# Patient Record
Sex: Female | Born: 1955 | Race: Black or African American | Hispanic: No | Smoking: Never smoker
Health system: Southern US, Community
[De-identification: ages and names within clinical notes are randomized; demographics above are authoritative.]

## PROBLEM LIST (undated history)

## (undated) DIAGNOSIS — T7840XA Allergy, unspecified, initial encounter: Secondary | ICD-10-CM

## (undated) DIAGNOSIS — I10 Essential (primary) hypertension: Secondary | ICD-10-CM

## (undated) DIAGNOSIS — E0500 Thyrotoxicosis with diffuse goiter without thyrotoxic crisis or storm: Secondary | ICD-10-CM

## (undated) DIAGNOSIS — M1990 Unspecified osteoarthritis, unspecified site: Secondary | ICD-10-CM

## (undated) HISTORY — PX: TUBAL LIGATION: SHX77

## (undated) HISTORY — DX: Thyrotoxicosis with diffuse goiter without thyrotoxic crisis or storm: E05.00

## (undated) HISTORY — PX: CHOLECYSTECTOMY: SHX55

## (undated) HISTORY — DX: Allergy, unspecified, initial encounter: T78.40XA

## (undated) HISTORY — DX: Unspecified osteoarthritis, unspecified site: M19.90

---

## 2000-08-12 ENCOUNTER — Other Ambulatory Visit: Admission: RE | Admit: 2000-08-12 | Discharge: 2000-08-12 | Payer: Self-pay | Admitting: Obstetrics and Gynecology

## 2000-08-21 ENCOUNTER — Ambulatory Visit (HOSPITAL_COMMUNITY): Admission: RE | Admit: 2000-08-21 | Discharge: 2000-08-21 | Payer: Self-pay | Admitting: Obstetrics and Gynecology

## 2002-07-09 ENCOUNTER — Ambulatory Visit (HOSPITAL_COMMUNITY): Admission: RE | Admit: 2002-07-09 | Discharge: 2002-07-09 | Payer: Self-pay | Admitting: Obstetrics and Gynecology

## 2002-07-09 ENCOUNTER — Encounter: Payer: Self-pay | Admitting: Obstetrics and Gynecology

## 2002-07-14 ENCOUNTER — Encounter: Payer: Self-pay | Admitting: Obstetrics and Gynecology

## 2002-07-14 ENCOUNTER — Ambulatory Visit (HOSPITAL_COMMUNITY): Admission: RE | Admit: 2002-07-14 | Discharge: 2002-07-14 | Payer: Self-pay | Admitting: Obstetrics and Gynecology

## 2002-09-27 ENCOUNTER — Ambulatory Visit (HOSPITAL_COMMUNITY): Admission: RE | Admit: 2002-09-27 | Discharge: 2002-09-27 | Payer: Self-pay | Admitting: Internal Medicine

## 2002-09-27 HISTORY — PX: COLONOSCOPY: SHX174

## 2003-02-02 ENCOUNTER — Ambulatory Visit (HOSPITAL_COMMUNITY): Admission: RE | Admit: 2003-02-02 | Discharge: 2003-02-02 | Payer: Self-pay | Admitting: Obstetrics and Gynecology

## 2003-08-09 ENCOUNTER — Ambulatory Visit (HOSPITAL_COMMUNITY): Admission: RE | Admit: 2003-08-09 | Discharge: 2003-08-09 | Payer: Self-pay | Admitting: Obstetrics and Gynecology

## 2006-01-10 ENCOUNTER — Ambulatory Visit (HOSPITAL_COMMUNITY): Admission: RE | Admit: 2006-01-10 | Discharge: 2006-01-10 | Payer: Self-pay | Admitting: Obstetrics and Gynecology

## 2007-01-12 ENCOUNTER — Ambulatory Visit (HOSPITAL_COMMUNITY): Admission: RE | Admit: 2007-01-12 | Discharge: 2007-01-12 | Payer: Self-pay | Admitting: Obstetrics and Gynecology

## 2007-07-10 ENCOUNTER — Other Ambulatory Visit: Admission: RE | Admit: 2007-07-10 | Discharge: 2007-07-10 | Payer: Self-pay | Admitting: Obstetrics and Gynecology

## 2007-09-17 ENCOUNTER — Ambulatory Visit: Payer: Self-pay | Admitting: Gastroenterology

## 2007-09-22 ENCOUNTER — Ambulatory Visit (HOSPITAL_COMMUNITY): Admission: RE | Admit: 2007-09-22 | Discharge: 2007-09-22 | Payer: Self-pay | Admitting: Gastroenterology

## 2007-09-22 ENCOUNTER — Ambulatory Visit: Payer: Self-pay | Admitting: Gastroenterology

## 2007-09-22 ENCOUNTER — Encounter: Payer: Self-pay | Admitting: Gastroenterology

## 2007-09-22 HISTORY — PX: COLONOSCOPY: SHX174

## 2010-07-17 NOTE — H&P (Signed)
NAME:  Hannah Knight, Hannah Knight        ACCOUNT NO.:  [id number]   MEDICAL RECORD NO.:  [id number]          PATIENT TYPE:  AMB   LOCATION:  DAY                           FACILITY:  [facility]   PHYSICIAN:  Kassie Mends, M.D.      DATE OF BIRTH:  1956-01-07   DATE OF ADMISSION:  DATE OF DISCHARGE:  LH                              HISTORY & PHYSICAL   CHIEF COMPLAINT:  Need for surveillance colonoscopy.  She has history of  inflammatory polyps and family history of colorectal carcinoma.   PRIMARY CARE PHYSICIAN:  Edward L. Juanetta Gosling, MD   HISTORY OF PRESENT ILLNESS:  Hannah Knight is a 55 year old African  American female.  She has history of inflammatory polyps and family  history of colon cancer where her father died at age 8.  She denies any  abdominal pain.  Denies rectal bleeding or melena.  Denies any diarrhea  or constipation.  Denies any nausea, vomiting, dysphagia, or  odynophagia.  Denies any upper GI symptoms, but rare heartburn.  Overall, she is doing very well.   PAST MEDICAL AND SURGICAL HISTORY:  She had a cholecystectomy 15 years  ago.  She had a tubal ligation.   Colonoscopy by Dr. Karilyn Cota, on September 27, 2002, showed 6 tiny polyps  removed from the sigmoid colon, which were hyperplastic and an  inflammatory polyp was removed from colonoscopy by Dr. Karilyn Cota, on  February 12, 1993.  She had 3 polyps 2 in the descending colon and 1 in  the sigmoid colon, which is inflammatory.   CURRENT MEDICATIONS:  Multivitamin daily.   ALLERGIES:  No known drug allergies.   FAMILY HISTORY:  Positive for father with colon cancer and mother  deceased at age 53 with MI.  She has lost a brother with lung cancer.   SOCIAL HISTORY:  Hannah Knight is married.  She is a [employer] officer, political party.  Denies any tobacco, alcohol, or drug use.   REVIEW OF SYSTEMS:  See HPI, otherwise negative.   PHYSICAL EXAMINATION:  VITAL SIGNS:  Weight 290 pounds, height 66  inches, temperature 98.6, blood pressure  122/84, and pulse 64.  GENERAL:  Hannah Knight is a well-developed and well-nourished African  American female, in no acute distress.  HEENT:  Sclerae clear, nonicteric.  Conjunctiva pink.  Oropharynx pink  and moist without any lesions.  NECK:  Supple without mass or thyromegaly.  CHEST:  Heart regular rate and rhythm.  Normal S1 and S2 without  murmurs, clicks, rubs, or gallops.  LUNGS:  Clear to auscultation bilaterally.  ABDOMEN:  Positive bowel sounds x4.  No bruits auscultated.  Soft,  nontender, and nondistended without palpable mass or hepatosplenomegaly.  No rebound, tenderness, or guarding.  EXTREMITIES:  Without clubbing or edema.   ASSESSMENT:  Hannah Knight is a 55 year old African American female  with history of inflammatory polyps and family history of colon cancer.  She is due for followup colonoscopy at this time.   PLAN:  Colonoscopy with Dr. Cira Servant in the near future.  Discussed  procedure including risks and benefits, including, but not limited to  bleeding, infection, perforation and drug reaction.  Consent will be  obtained.  She agrees with this plan.      Lorenza Burton, N.P.      Kassie Mends, M.D.  Electronically Signed    KJ/MEDQ  D:  09/18/2007  T:  09/18/2007  Job:  [id number]   cc:   Ramon Dredge L. Juanetta Gosling, M.D.  Fax: [phone]

## 2010-07-17 NOTE — Op Note (Signed)
NAME:  Hannah Knight, Hannah Knight        ACCOUNT NO.:  [id number]   MEDICAL RECORD NO.:  [id number]          PATIENT TYPE:  AMB   LOCATION:  DAY                           FACILITY:  [facility]   PHYSICIAN:  Kassie Mends, M.D.      DATE OF BIRTH:  02-Mar-1956   DATE OF PROCEDURE:  09/22/2007  DATE OF DISCHARGE:                               OPERATIVE REPORT   REFERRING PHYSICIAN:  Kari Baars, M.D.   PROCEDURE:  Colonoscopy with cold forceps biopsy and polypectomy.   INDICATIONS FOR EXAM:  Hannah Knight is a 55 year old female whose  father had colon cancer at age 63.   FINDINGS:  1. Multiple 3-4 mm sessile polyps seen in the splenic flexure in the      descending colon.  The polyps were removed via cold forceps;      otherwise no masses, inflammatory changes, diverticular AVM seen.  2. Moderate internal hemorrhoids, otherwise normal retroflex view of      the rectum.   DIAGNOSIS:  Colon polyps in the patient with the first-degree relative  diagnosed with colon cancer at age less than 7.   RECOMMENDATIONS:  1. Screening colonoscopy in 5 years.  2. Will call Hannah Knight with the results of her biopsies.  3. No aspirin, NSAIDs, or anticoagulation for 5 days.  4. She should follow a high-fiber diet.  She was given a handout on      high-fiber diet and polyps.   MEDICATIONS:  1. Demerol 125 mg IV.  2. Versed 6 mg IV.   PROCEDURE TECHNIQUE:  Physical exam was performed.  Informed consent was  obtained from the patient after explaining the benefits, risks, and  alternatives to the procedure.  The patient was connected to the monitor  and placed in the left lateral position.  Continuous oxygen was provided  via a nasal cannula.  IV medicine was administered through an indwelling  cannula.  After administration of sedation and rectal exam, the  patient's rectum was intubated and the scope was advanced under direct  visualization to the cecum.  The scope was removed slowly  by  carefully examining the color, texture, anatomy, and integrity of the  mucosa on the way out.  The patient was recovered in endoscopy and  discharged home in satisfactory condition.   PATH:  Hyperplastic polyp. TCS 5 yrs. High fiber diet.      Kassie Mends, M.D.  Electronically Signed     SM/MEDQ  D:  09/22/2007  T:  09/23/2007  Job:  [id number]   cc:   Hannah Knight, M.D.  Fax: [phone]

## 2010-07-20 NOTE — Op Note (Signed)
NAME:  Hannah Knight, Hannah Knight                  ACCOUNT NO.:  [id number]   MEDICAL RECORD NO.:  [id number]                   PATIENT TYPE:  AMB   LOCATION:  DAY                                  FACILITY:  [facility]   PHYSICIAN:  Lionel December, M.D.                 DATE OF BIRTH:  May 16, 1955   DATE OF PROCEDURE:  DATE OF DISCHARGE:                                 OPERATIVE REPORT   PROCEDURE:  Total colonoscopy.   ENDOSCOPIST:  Lionel December, M.D.   INDICATIONS:  Dnasia is a 55 year old African-American female who has heme-  positive stools.  She does not have GI symptoms and does not take any  NSAIDs.  Family history is sigmoid colon for colon carcinoma in her father  who died of it at age 56.  Her last colonoscopy was in 1994.  The procedures  were reviewed with the patient and informed consent was obtained.   PREOPERATIVE MEDICATIONS:  Demerol 50 mg IV and Versed 9 mg IV in divided  doses.   FINDINGS:  Procedure performed in endoscopy suite.  The patient's vital  signs and O2 saturation were monitored during the procedure and remained  stable.  The patient was placed in the left lateral recumbent position and  rectal examination was performed.  No abnormality noted on external or  digital exam.   Olympus videoscope was placed in the rectum and advanced under vision into  the sigmoid colon and beyond.  The sigmoid colon was very tortuous.  Some  difficulty was encountered in passing the scope across the splenic flexure.  Once this was done, the scope was easily advanced into the cecum which was  identified by appendiceal orifice and the ileocecal valve.  Pictures were  taken for the record.  As the scope was withdrawn colonic mucosa was, once  again, carefully examined.  There were multiple (6) close tiny polyps of the  sigmoid colon which were ablated by cold biopsy and submitted in 1  container.  Rectal mucosa was normal.   The scope was retroflexed to examine the anorectal  junction and small  hemorrhoids were noted below the dentate line.  The endoscope was  straightened and withdrawn.  The patient tolerated the procedure well.   FINAL DIAGNOSES:  1. Examination performed to the cecum.  Redundant sigmoid colon.  Six tiny     polyps ablated by cold biopsy from sigmoid colon.  2. Small external hemorrhoids.    RECOMMENDATIONS:  1. Contact patient with biopsy results.  2. Will plan to repeat her Hemoccults in 3 months from now.                                               Lionel December, M.D.    NR/MEDQ  D:  09/27/2002  T:  09/27/2002  Job:  [id number]   cc:   Tilda Burrow, M.D.  [address] [place]  [place] [address]  Fax: [phone]   Oneal Deputy. Juanetta Gosling, M.D.  [address]  [place]  [place] [address]  Fax: [phone]

## 2010-07-20 NOTE — H&P (Signed)
NAME:  Hannah, Knight                  ACCOUNT NO.:  [id number]   MEDICAL RECORD NO.:  [id number]                  PATIENT TYPE:   LOCATION:                                       FACILITY:   PHYSICIAN:  Lionel December, M.D.                 DATE OF BIRTH:  [birth year]   DATE OF ADMISSION:  DATE OF DISCHARGE:                                HISTORY & PHYSICAL   PRESENTING COMPLAINT:  Heme-positive stool noted on routine exam.   HISTORY:  Hannah Knight is a 55 year old African American female who referred by  the courtesy of Dr. Emelda Fear for further evaluation of heme-positive stool  which was picked up on routine pelvic and Pap done on July 01, 2002.  The  patient made an appointment on Jul 19, 2002, but this was moved to this  date.  She denies melena, rectal bleeding, change in her bowel habits,  abdominal pain, nausea, vomiting, heartburn, or dysphagia.  She has a very  good appetite.  She does not take aspirin or other NSAIDs.  She actually  does not take any prescription medications.  What she is taking now includes  Centrum vitamin, vitamin B, and vitamin C.   Hannah Knight had a colonoscopy by myself in December 1994, because of rectal  bleeding, some diarrhea.  She had internal hemorrhoids and she had two small  polyps, two in the descending colon and one in the sigmoid which were all  inflammatory.   PAST MEDICAL HISTORY:  She has no medical problems.  She had laparoscopic  cholecystectomy in May 1993, and had tubal ligation either three or four  years ago.   FAMILY HISTORY:  Both parents are deceased.  Mother was diabetic and died of  MI at age 63.  Father died of colon carcinoma at age 69.  One brother died  of lung carcinoma at age 28.  Two brothers and five sisters are in good  health.  Three sisters are diabetic and two of them are hypertensive.   SOCIAL HISTORY:  She is married, but does not have any children.  She has  worked at [employer] for 18 years until  it closed down and now she  works at [employer].  She has never smoked cigarettes and does  not drink alcohol.   PHYSICAL EXAMINATION:  GENERAL:  Mildly obese African American female who is  in no acute distress.  She weighs 217 pounds.  ____________.  VITAL SIGNS:  Blood pressure 130/100, temperature 98.7.  HEENT:  Conjunctivae pink.  Sclerae nonicteric.  Oropharyngeal mucosa is  normal.  NECK:  Without masses or thyromegaly.  CARDIAC:  Rate and rhythm normal, S1, S2, no murmur or gallop noted.  LUNGS:  Clear to auscultation.  ABDOMEN:  Full, but soft and nontender with no organomegaly or masses.  RECTAL:  Deferred.  She does not have peripheral edema or clubbing.   ASSESSMENT:  Hannah Knight is a 55 year old African  American female who was  recently noted to have heme-positive stools.  She has virtually no GI  symptoms and does not take any NSAIDs.  Her last colonoscopy was in December  1994, revealing three inflammatory polyps.  Family history is significant  for colon carcinoma in a father who died at age 75.   Hannah Knight needs to undergo colonoscopy both for diagnostic and screening  purposes.   RECOMMENDATIONS:  Total colonoscopy to be performed at [facility] in the near  future.  I have reviewed the procedure risks with the patient.  She is  agreeable.                                               Lionel December, M.D.    NR/MEDQ  D:  09/16/2002  T:  09/16/2002  Job:  [id number]   cc:   Tilda Burrow, M.D.  [address] [place]  [place] [address]  Fax: [phone]   Dr. Juanetta Gosling

## 2010-10-18 ENCOUNTER — Other Ambulatory Visit: Payer: Self-pay | Admitting: Adult Health

## 2010-10-18 ENCOUNTER — Other Ambulatory Visit (HOSPITAL_COMMUNITY)
Admission: RE | Admit: 2010-10-18 | Discharge: 2010-10-18 | Disposition: A | Discharge status: Home / Self Care | Payer: 59 | Source: Ambulatory Visit | Attending: Obstetrics and Gynecology | Admitting: Obstetrics and Gynecology

## 2010-10-18 DIAGNOSIS — Z01419 Encounter for gynecological examination (general) (routine) without abnormal findings: Secondary | ICD-10-CM | POA: Insufficient documentation

## 2010-12-04 ENCOUNTER — Other Ambulatory Visit (HOSPITAL_COMMUNITY): Payer: Self-pay | Admitting: Pulmonary Disease

## 2010-12-04 DIAGNOSIS — Z139 Encounter for screening, unspecified: Secondary | ICD-10-CM

## 2010-12-11 ENCOUNTER — Ambulatory Visit (HOSPITAL_COMMUNITY)
Admission: RE | Admit: 2010-12-11 | Discharge: 2010-12-11 | Disposition: A | Discharge status: Home / Self Care | Payer: 59 | Source: Ambulatory Visit | Attending: Pulmonary Disease | Admitting: Pulmonary Disease

## 2010-12-11 DIAGNOSIS — Z1231 Encounter for screening mammogram for malignant neoplasm of breast: Secondary | ICD-10-CM | POA: Insufficient documentation

## 2010-12-11 DIAGNOSIS — Z139 Encounter for screening, unspecified: Secondary | ICD-10-CM

## 2010-12-11 LAB — HM MAMMOGRAPHY

## 2011-01-31 ENCOUNTER — Other Ambulatory Visit (HOSPITAL_COMMUNITY): Payer: Self-pay | Admitting: "Endocrinology

## 2011-01-31 DIAGNOSIS — E0590 Thyrotoxicosis, unspecified without thyrotoxic crisis or storm: Secondary | ICD-10-CM

## 2011-02-04 ENCOUNTER — Encounter (HOSPITAL_COMMUNITY)
Admission: RE | Admit: 2011-02-04 | Discharge: 2011-02-04 | Disposition: A | Discharge status: Home / Self Care | Payer: 59 | Source: Ambulatory Visit | Attending: "Endocrinology | Admitting: "Endocrinology

## 2011-02-04 ENCOUNTER — Encounter (HOSPITAL_COMMUNITY): Payer: Self-pay

## 2011-02-04 DIAGNOSIS — E0590 Thyrotoxicosis, unspecified without thyrotoxic crisis or storm: Secondary | ICD-10-CM

## 2011-02-04 HISTORY — DX: Essential (primary) hypertension: I10

## 2011-02-04 MED ORDER — SODIUM IODIDE I 131 CAPSULE
10.0000 | Freq: Once | INTRAVENOUS | Status: AC | PRN
  Administered 2011-02-04: 9 via ORAL

## 2011-02-05 ENCOUNTER — Encounter (HOSPITAL_COMMUNITY)
Admission: RE | Admit: 2011-02-05 | Discharge: 2011-02-05 | Disposition: A | Discharge status: Home / Self Care | Payer: 59 | Source: Ambulatory Visit | Attending: "Endocrinology | Admitting: "Endocrinology

## 2011-02-05 DIAGNOSIS — E0590 Thyrotoxicosis, unspecified without thyrotoxic crisis or storm: Secondary | ICD-10-CM | POA: Insufficient documentation

## 2011-02-05 MED ORDER — SODIUM PERTECHNETATE TC 99M INJECTION
10.0000 | Freq: Once | INTRAVENOUS | Status: AC | PRN
  Administered 2011-02-05: 10 via INTRAVENOUS

## 2011-02-07 ENCOUNTER — Other Ambulatory Visit (HOSPITAL_COMMUNITY): Payer: Self-pay | Admitting: "Endocrinology

## 2011-02-07 DIAGNOSIS — E0500 Thyrotoxicosis with diffuse goiter without thyrotoxic crisis or storm: Secondary | ICD-10-CM

## 2011-02-13 ENCOUNTER — Encounter (HOSPITAL_COMMUNITY)
Admission: RE | Admit: 2011-02-13 | Discharge: 2011-02-13 | Disposition: A | Discharge status: Home / Self Care | Payer: 59 | Source: Ambulatory Visit | Attending: "Endocrinology | Admitting: "Endocrinology

## 2011-02-13 DIAGNOSIS — E0500 Thyrotoxicosis with diffuse goiter without thyrotoxic crisis or storm: Secondary | ICD-10-CM

## 2011-02-13 DIAGNOSIS — E0590 Thyrotoxicosis, unspecified without thyrotoxic crisis or storm: Secondary | ICD-10-CM | POA: Insufficient documentation

## 2011-02-13 MED ORDER — SODIUM IODIDE I 131 CAPSULE
12.0000 | Freq: Once | INTRAVENOUS | Status: AC | PRN
  Administered 2011-02-13: 12 via ORAL

## 2013-05-19 ENCOUNTER — Telehealth: Payer: Self-pay | Admitting: Gastroenterology

## 2013-05-19 NOTE — Telephone Encounter (Signed)
Pt called today to set up her tcs. She is having no problems, but is due for one. SF did her last one. [phone]

## 2013-05-19 NOTE — Telephone Encounter (Signed)
I called pt in reference to scheduling colonoscopy. She has had blood in her stool a couple of times recently. Ov scheduled with Laban Emperor, NP on 06/17/2013 at 8:30 AM.

## 2013-05-20 NOTE — Telephone Encounter (Signed)
Routing to Anna Sams, NP. 

## 2013-05-24 NOTE — Telephone Encounter (Signed)
Noted  

## 2013-06-17 ENCOUNTER — Encounter (HOSPITAL_COMMUNITY): Payer: Self-pay | Admitting: Pharmacy Technician

## 2013-06-17 ENCOUNTER — Encounter: Payer: Self-pay | Admitting: Gastroenterology

## 2013-06-17 ENCOUNTER — Ambulatory Visit (INDEPENDENT_AMBULATORY_CARE_PROVIDER_SITE_OTHER): Payer: 59 | Admitting: Gastroenterology

## 2013-06-17 ENCOUNTER — Encounter (INDEPENDENT_AMBULATORY_CARE_PROVIDER_SITE_OTHER): Payer: Self-pay

## 2013-06-17 VITALS — BP 124/80 | HR 70 | Temp 97.6°F | Ht 67.0 in | Wt 214.6 lb

## 2013-06-17 DIAGNOSIS — Z800 Family history of malignant neoplasm of digestive organs: Secondary | ICD-10-CM

## 2013-06-17 DIAGNOSIS — K625 Hemorrhage of anus and rectum: Secondary | ICD-10-CM

## 2013-06-17 MED ORDER — HYDROCORTISONE ACETATE 25 MG RE SUPP: 25.0000 mg | Freq: Two times a day (BID) | RECTAL | Status: DC

## 2013-06-17 MED ORDER — PEG-KCL-NACL-NASULF-NA ASC-C 100 G PO SOLR: 1.0000 | ORAL | Status: DC

## 2013-06-17 NOTE — Patient Instructions (Signed)
I have sent suppositories to the pharmacy to use twice a day for 6 days. This is to help calm down any inflammation that may be inside your rectum from hemorrhoids.  You have been scheduled for a colonoscopy with Dr. Oneida Alar in the near future.   Do not take Metformin the morning of the colonoscopy.

## 2013-06-17 NOTE — Progress Notes (Signed)
cc'd to pcp 

## 2013-06-17 NOTE — Assessment & Plan Note (Signed)
Father, age 56, deceased. TCS as planned.

## 2013-06-17 NOTE — Assessment & Plan Note (Signed)
58 year old female with moderate volume hematochezia several weeks ago, now resolved. No other associated factors. History of internal hemorrhoids; doubt significant GI issue but needs high risk screening colonoscopy due to [place] of colon cancer in father at age 75. Last colonoscopy in 2009, just slightly overdue for screening.   Proceed with colonoscopy with Dr. Oneida Alar in the near future. The risks, benefits, and alternatives have been discussed in detail with the patient. They state understanding and desire to proceed.  Anusol supp BID

## 2013-06-17 NOTE — Progress Notes (Signed)
Primary Care Physician:  Alonza Bogus, MD Primary Gastroenterologist:  Dr. Oneida Alar   Chief Complaint  Patient presents with  . Rectal Bleeding  . Colonoscopy    HPI:   Hannah Knight presents today with a family history of colon cancer in her father at age 56; she has noticed intermittent rectal bleeding. A little overdue for high risk screening colonoscopy. Last procedure in 2009 with internal hemorrhoids and hyperplastic polyps. States she would see it for a week or two then stop. No further rectal bleeding for several weeks. No change in bowel habits. No abdominal pain. No unexplained weight loss or lack of appetite. No reflux, dysphagia, upper GI symptoms.  Past Medical History  Diagnosis Date  . Diabetes mellitus   . Hypertension   . Graves disease     with radiation, now on Synthroid    Past Surgical History  Procedure Laterality Date  . Colonoscopy    09/22/2007    SLF: moderate internal hemorrhoids, multiple 3-4 mm sessile polyps in splenic flexure, hyperplastic.   Marland Kitchen Colonoscopy  09/27/2002      Rehman:Small external hemorrhoids/Six tiny   polyps ablated by cold biopsy from sigmoid colon  . Cholecystectomy    . Tubal ligation      Current Outpatient Prescriptions  Medication Sig Dispense Refill  . amLODipine (NORVASC) 5 MG tablet Take 5 mg by mouth daily.      Marland Kitchen levothyroxine (SYNTHROID, LEVOTHROID) 75 MCG tablet Take 75 mcg by mouth daily before breakfast.      . losartan (COZAAR) 100 MG tablet Take 100 mg by mouth daily.      . metFORMIN (GLUCOPHAGE) 500 MG tablet Take 500 mg by mouth daily with breakfast.       No current facility-administered medications for this visit.    Allergies as of 06/17/2013 - Review Complete 06/17/2013  Allergen Reaction Noted  . Ace inhibitors  02/04/2011    Family History  Problem Relation Age of Onset  . Colon cancer Father 59    deceased    History   Social History  . Marital Status: Married   Spouse Name: N/A    Number of Children: N/A  . Years of Education: N/A   Occupational History  . Self-employed     sitter   Social History Main Topics  . Smoking status: Never Smoker   . Smokeless tobacco: Not on file  . Alcohol Use: No  . Drug Use: No  . Sexual Activity: Not on file   Other Topics Concern  . Not on file   Social History Narrative  . No narrative on file    Review of Systems: Negative unless mentioned in HPI.   Physical Exam: BP 124/80  Pulse 70  Temp(Src) 97.6 F (36.4 C) (Oral)  Ht 5\' 7"  (1.702 m)  Wt 214 lb 9.6 oz (97.342 kg)  BMI 33.60 kg/m2 General:   Alert and oriented. Pleasant and cooperative. Well-nourished and well-developed.  Head:  Normocephalic and atraumatic. Eyes:  Without icterus, sclera clear and conjunctiva pink.  Ears:  Normal auditory acuity. Nose:  No deformity, discharge,  or lesions. Mouth:  No deformity or lesions, oral mucosa pink.  Neck:  Supple, without mass or thyromegaly. Lungs:  Clear to auscultation bilaterally. No wheezes, rales, or rhonchi. No distress.  Heart:  S1, S2 present without murmurs appreciated.  Abdomen:  +BS, soft, non-tender and non-distended. No HSM noted. No guarding or rebound. No masses appreciated.  Rectal:  Deferred  Msk:  Symmetrical without gross deformities. Normal posture. Extremities:  Without clubbing or edema. Neurologic:  Alert and  oriented x4;  grossly normal neurologically. Skin:  Intact without significant lesions or rashes. Cervical Nodes:  No significant cervical adenopathy. Psych:  Alert and cooperative. Normal mood and affect.

## 2013-06-29 ENCOUNTER — Ambulatory Visit (HOSPITAL_COMMUNITY)
Admission: RE | Admit: 2013-06-29 | Discharge: 2013-06-29 | Disposition: A | Discharge status: Home / Self Care | Payer: 59 | Source: Ambulatory Visit | Attending: Gastroenterology | Admitting: Gastroenterology

## 2013-06-29 ENCOUNTER — Encounter (HOSPITAL_COMMUNITY)
Admission: RE | Disposition: A | Discharge status: Home / Self Care | Payer: Self-pay | Source: Ambulatory Visit | Attending: Gastroenterology

## 2013-06-29 ENCOUNTER — Encounter (HOSPITAL_COMMUNITY): Payer: Self-pay | Admitting: *Deleted

## 2013-06-29 DIAGNOSIS — E119 Type 2 diabetes mellitus without complications: Secondary | ICD-10-CM | POA: Insufficient documentation

## 2013-06-29 DIAGNOSIS — K625 Hemorrhage of anus and rectum: Secondary | ICD-10-CM | POA: Insufficient documentation

## 2013-06-29 DIAGNOSIS — Z800 Family history of malignant neoplasm of digestive organs: Secondary | ICD-10-CM | POA: Insufficient documentation

## 2013-06-29 DIAGNOSIS — Z79899 Other long term (current) drug therapy: Secondary | ICD-10-CM | POA: Insufficient documentation

## 2013-06-29 DIAGNOSIS — K5730 Diverticulosis of large intestine without perforation or abscess without bleeding: Secondary | ICD-10-CM | POA: Insufficient documentation

## 2013-06-29 DIAGNOSIS — I10 Essential (primary) hypertension: Secondary | ICD-10-CM | POA: Insufficient documentation

## 2013-06-29 DIAGNOSIS — K648 Other hemorrhoids: Secondary | ICD-10-CM | POA: Insufficient documentation

## 2013-06-29 HISTORY — PX: HEMORRHOID BANDING: SHX5850

## 2013-06-29 HISTORY — PX: COLONOSCOPY: SHX5424

## 2013-06-29 LAB — HM COLONOSCOPY

## 2013-06-29 SURGERY — COLONOSCOPY
Anesthesia: Moderate Sedation

## 2013-06-29 MED ORDER — SODIUM CHLORIDE 0.9 % IV SOLN
INTRAVENOUS | Status: DC
  Administered 2013-06-29: 1000 mL via INTRAVENOUS

## 2013-06-29 MED ORDER — MEPERIDINE HCL 100 MG/ML IJ SOLN
INTRAMUSCULAR | Status: AC
  Filled 2013-06-29: qty 2

## 2013-06-29 MED ORDER — PROMETHAZINE HCL 25 MG/ML IJ SOLN
INTRAMUSCULAR | Status: DC | PRN
  Administered 2013-06-29: 12.5 mg via INTRAVENOUS

## 2013-06-29 MED ORDER — MIDAZOLAM HCL 5 MG/5ML IJ SOLN
INTRAMUSCULAR | Status: AC
  Filled 2013-06-29: qty 10

## 2013-06-29 MED ORDER — MIDAZOLAM HCL 5 MG/5ML IJ SOLN
INTRAMUSCULAR | Status: DC | PRN
  Administered 2013-06-29 (×2): 2 mg via INTRAVENOUS

## 2013-06-29 MED ORDER — SODIUM CHLORIDE 0.9 % IJ SOLN
INTRAMUSCULAR | Status: AC
  Filled 2013-06-29: qty 10

## 2013-06-29 MED ORDER — MEPERIDINE HCL 100 MG/ML IJ SOLN
INTRAMUSCULAR | Status: DC | PRN
  Administered 2013-06-29: 50 mg via INTRAVENOUS
  Administered 2013-06-29: 25 mg via INTRAVENOUS

## 2013-06-29 MED ORDER — PROMETHAZINE HCL 25 MG/ML IJ SOLN
INTRAMUSCULAR | Status: AC
  Filled 2013-06-29: qty 1

## 2013-06-29 NOTE — Progress Notes (Signed)
REVIEWED.  

## 2013-06-29 NOTE — Op Note (Signed)
[facility] [address] [place], [address]   COLONOSCOPY PROCEDURE REPORT  PATIENT: Hannah Knight, Hannah Knight  MR#: [id number] BIRTHDATE: Feb 03, 1956 , 13  yrs. old GENDER: Female ENDOSCOPIST: Barney Drain, MD REFERRED [place]:[id number] Luan Pulling, M.D. PROCEDURE DATE:  06/29/2013 PROCEDURE:   Colonoscopy, diagnostic and Hemorrhoidectomy via banding(3) INDICATIONS:Rectal Bleeding and Patient's immediate family history of colon cancer. MEDICATIONS: Promethazine (Phenergan) 12.5mg  IV, Demerol 75 mg IV, and Versed 4 mg IV  DESCRIPTION OF PROCEDURE:    Physical exam was performed.  Informed consent was obtained from the patient after explaining the benefits, risks, and alternatives to procedure.  The patient was connected to monitor and placed in left lateral position. Continuous oxygen was provided by nasal cannula and IV medicine administered through an indwelling cannula.  After administration of sedation and rectal exam, the patients rectum was intubated and the EC-[id number] ([id number]) and [id number] ([id number])  colonoscope was advanced under direct visualization to the ileum.  The scope was removed slowly by carefully examining the color, texture, anatomy, and integrity mucosa on the way out.  The patient was recovered in endoscopy and discharged home in satisfactory condition.    COLON FINDINGS: The mucosa appeared normal in the terminal ileum.  , Mild diverticulosis was noted in the sigmoid colon.  , The colon mucosa was otherwise normal.  , and Moderate sized internal hemorrhoids were found.    3 BANDS APPLIED.  PREP QUALITY: good. CECAL W/D TIME: 14 minutes     COMPLICATIONS: None  ENDOSCOPIC IMPRESSION: 1.   Normal mucosa in the terminal ileum 2.   Mild diverticulosis in the sigmoid colon 3.   RECTAL BLEEDING DUE TO Moderate sized internal hemorrhoids   RECOMMENDATIONS: CALL [phone] IF YOU HAVE A FEVER, A LARGE AMOUNT OF BLEEDING, OR DIFFICULTY URINATING. DRINK  WATER TO KEEP URINE LIGHT YELLOW. MAY USE NAPROXEN TWICE DAILY FOR RECTAL DISCOMFORT.  TAKE WITH FOOD OR MILK TO PREVENT ULCERS.  TYLENOL AS NEED FOR ADDITIONAL PAIN RELIEF. COLACE ONCE OR TWICE DAILY TO KEEP STOOLS SOFT. FOLLOW A LOW RESIDUE DIET FOR THE NEXT 2 WEEKS.  FOLLOW UP MAY 13 AT 1130 Next colonoscopy in 5 years.   _______________________________eSigned:  Barney Drain, MD 06/29/2013 3:29 PM

## 2013-06-29 NOTE — Discharge Instructions (Signed)
You have internal hemorrhoids. YOU DID NOT HAVE ANY POLYPS. I PLACED 3 LIGHT BLUE BANDS TO TREAT YOUR HEMORRHOIDAL BLEEDING. YOU MAY SOME MILD BLEEDING OVER THE NEXT 3 TO 5 DAYS.    CALL [phone] IF YOU HAVE A FEVER, A LARGE AMOUNT OF BLEEDING, OR DIFFICULTY URINATING.  DRINK WATER TO KEEP URINE LIGHT YELLOW.  YOU MAY USE NAPROXEN TWICE DAILY FOR RECTAL DISCOMFORT. TAKE WITH FOOD OR MILK TO PREVENT ULCERS. TYLENOL AS NEED FOR ADDITIONAL PAIN RELIEF.  COLACE ONCE OR TWICE DAILY TO KEEP STOOLS SOFT.  FOLLOW A LOW RESIDUE DIET FOR THE NEXT 2 WEEKS. SEE INFO BELOW.  FOLLOW UP IN 3 WEEKS.  Next colonoscopy in 5 years.    Colonoscopy Care After Read the instructions outlined below and refer to this sheet in the next week. These discharge instructions provide you with general information on caring for yourself after you leave the hospital. While your treatment has been planned according to the most current medical practices available, unavoidable complications occasionally occur. If you have any problems or questions after discharge, call DR. FIELDS, [phone].  ACTIVITY  You may resume your regular activity, but move at a slower pace for the next 24 hours.   Take frequent rest periods for the next 24 hours.   Walking will help get rid of the air and reduce the bloated feeling in your belly (abdomen).   No driving for 24 hours (because of the medicine (anesthesia) used during the test).   You may shower.   Do not sign any important legal documents or operate any machinery for 24 hours (because of the anesthesia used during the test).    NUTRITION  Drink plenty of fluids.   You may resume your normal diet as instructed by your doctor.   Begin with a light meal and progress to your normal diet. Heavy or fried foods are harder to digest and may make you feel sick to your stomach (nauseated).   Avoid alcoholic beverages for 24 hours or as instructed.    MEDICATIONS  You  may resume your normal medications.   WHAT YOU CAN EXPECT TODAY  Some feelings of bloating in the abdomen.   Passage of more gas than usual.   Spotting of blood in your stool or on the toilet paper  .  IF YOU HAD POLYPS REMOVED DURING THE COLONOSCOPY:  Eat a soft diet IF YOU HAVE NAUSEA, BLOATING, ABDOMINAL PAIN, OR VOMITING.    FINDING OUT THE RESULTS OF YOUR TEST Not all test results are available during your visit. DR. Oneida Alar WILL CALL YOU WITHIN 7 DAYS OF YOUR PROCEDUE WITH YOUR RESULTS. Do not assume everything is normal if you have not heard from DR. FIELDS IN ONE WEEK, CALL HER OFFICE AT [phone].  SEEK IMMEDIATE MEDICAL ATTENTION AND CALL THE OFFICE: [phone] IF:  You have more than a spotting of blood in your stool.   Your belly is swollen (abdominal distention).   You are nauseated or vomiting.   You have a temperature over 101F.   You have abdominal pain or discomfort that is severe or gets worse throughout the day.   Hemorrhoids Hemorrhoids are dilated (enlarged) veins around the rectum. Sometimes clots will form in the veins. This makes them swollen and painful. These are called thrombosed hemorrhoids. Causes of hemorrhoids include:  Constipation.   Straining to have a bowel movement.   HEAVY LIFTING   HOME CARE INSTRUCTIONS  Eat a well balanced diet and drink 6 to  8 glasses of water every day to avoid constipation. You may also use a bulk laxative.   Avoid straining to have bowel movements.   Keep anal area dry and clean.   Do not use a donut shaped pillow or sit on the toilet for long periods. This increases blood pooling and pain.   Move your bowels when your body has the urge; this will require less straining and will decrease pain and pressure.  LOW RESIDUE DIET  A low-residue diet, aka low-fiber diet, is usually recommended. An intake of less than 10 grams of fiber per day is generally considered a low-residue diet.  LOW RESIDUE  Diet  Grain Products: enriched refined white bread, buns, bagels, English muffins  plain cereals e.g. Cheerios, Cornflakes, Cream of Wheat, Rice Krispies, Special K  arrowroot cookies, tea biscuits, soda crackers, plain melba toast  white rice, refined pasta and noodles  avoid whole grains   Fruits: fruit juices except prune juice  applesauce, apricots, banana (1/2), cantaloupe, canned fruit cocktail, grapes, honeydew melon, peaches, watermelon  avoid raw and dried fruits, and berries.   Vegetables: vegetable juices  potatoes (no Skin)  alfalfa sprouts, beets, green/yellow beans, carrots, celery, cucumber, eggplant, lettuce, mushrooms, green/red peppers, potatoes (peeled), squash, zucchini  avoid vegetables from the cruciferous family such as broccoli, cauliflower, brussels sprouts, cabbage, kale, Swiss chard, onion, etc   Meat and Protein Choice: well-cooked, tender meat, fish and eggs  avoid beans  avoid all nuts and seeds, as well as foods that may contain seeds (such as yogurt)   Dairy: NO RESTRICTIONS   Drinks: juices  tea and coffee   avoid alcohol    HEMORRHOIDAL BANDING COMPLICATIONS:  COMMON: 1. MINOR PAIN  UNCOMMON: 1. ABSCESS  2. BAND FALLS OFF  3. PROLAPSE OF HEMORRHOIDS AND PAIN  4. RECTAL BLEEDING  A. USUALLY SELF-LIMITED: MAY LAST 3-5 DAYS  B. MAY REQUIRE INTERVENTION: 1-2 WEEKS AFTER INTERACTIONS  5. NECROTIZING PELVIC SEPSIS-A SURGICAL EMERGENCY**  A. SYMPTOMS: FEVER, PAIN, DIFFICULTY URINATING

## 2013-06-29 NOTE — H&P (Signed)
  Primary Care Physician:  Alonza Bogus, MD Primary Gastroenterologist:  Dr. Oneida Alar  Pre-Procedure History & Physical: HPI:  Hannah Knight is a 58 y.o. female here for BRBPR/FAMILY Hx COLON CA-FATHER HAD COLON CA AGE < 60.  Past Medical History  Diagnosis Date  . Diabetes mellitus   . Hypertension   . Graves disease     with radiation, now on Synthroid    Past Surgical History  Procedure Laterality Date  . Colonoscopy    09/22/2007    SLF: moderate internal hemorrhoids, multiple 3-4 mm sessile polyps in splenic flexure, hyperplastic.   Marland Kitchen Colonoscopy  09/27/2002      Rehman:Small external hemorrhoids/Six tiny   polyps ablated by cold biopsy from sigmoid colon  . Cholecystectomy    . Tubal ligation      Prior to Admission medications   Medication Sig Start Date End Date Taking? Authorizing Provider  amLODipine (NORVASC) 5 MG tablet Take 5 mg by mouth daily.   Yes Historical Provider, MD  hydrocortisone (ANUSOL-HC) 25 MG suppository Place 1 suppository (25 mg total) rectally every 12 (twelve) hours. 06/17/13  Yes Orvil Feil, NP  levothyroxine (SYNTHROID, LEVOTHROID) 75 MCG tablet Take 75 mcg by mouth daily before breakfast.   Yes Historical Provider, MD  losartan (COZAAR) 100 MG tablet Take 100 mg by mouth daily.   Yes Historical Provider, MD  metFORMIN (GLUCOPHAGE) 500 MG tablet Take 500 mg by mouth daily with breakfast.   Yes Historical Provider, MD  peg 3350 powder (MOVIPREP) 100 G SOLR Take 1 kit (200 g total) by mouth as directed. PHARMACIST USE THE FOLLOWING FOR PATIENT DISCOUNT BIN: [id number] GROUP: [id number] ID: [id number] PATIENTS WILL SAVE UP TO $10 ON THEIR OUT-OF-POCKET EXPENSE FOR PROCESSING QUESTIONS, CALL [phone] 06/17/13  Yes Danie Binder, MD    Allergies as of 06/17/2013 - Review Complete 06/17/2013  Allergen Reaction Noted  . Ace inhibitors Anaphylaxis and Swelling 02/04/2011    Family History  Problem Relation Age of Onset  . Colon  cancer Father 76    deceased    History   Social History  . Marital Status: Married    Spouse Name: N/A    Number of Children: N/A  . Years of Education: N/A   Occupational History  . Self-employed     sitter   Social History Main Topics  . Smoking status: Never Smoker   . Smokeless tobacco: Not on file  . Alcohol Use: No  . Drug Use: No  . Sexual Activity: Not on file   Other Topics Concern  . Not on file   Social History Narrative  . No narrative on file    Review of Systems: See HPI, otherwise negative ROS   Physical Exam: There were no vitals taken for this visit. General:   Alert,  pleasant and cooperative in NAD Head:  Normocephalic and atraumatic. Neck:  Supple; Lungs:  Clear throughout to auscultation.    Heart:  Regular rate and rhythm. Abdomen:  Soft, nontender and nondistended. Normal bowel sounds, without guarding, and without rebound.   Neurologic:  Alert and  oriented x4;  grossly normal neurologically.  Impression/Plan:    BRBPR/ FAMILY Hx COLON CA-FATHER HAD COLON CA AGE > 60.  PLAN: 1. TCS TODAY   PLAN: TCS TODAY

## 2013-07-01 ENCOUNTER — Encounter (HOSPITAL_COMMUNITY): Payer: Self-pay | Admitting: Gastroenterology

## 2013-07-21 ENCOUNTER — Ambulatory Visit (INDEPENDENT_AMBULATORY_CARE_PROVIDER_SITE_OTHER): Payer: 59 | Admitting: Gastroenterology

## 2013-07-21 ENCOUNTER — Encounter (INDEPENDENT_AMBULATORY_CARE_PROVIDER_SITE_OTHER): Payer: Self-pay

## 2013-07-21 ENCOUNTER — Encounter: Payer: Self-pay | Admitting: Gastroenterology

## 2013-07-21 VITALS — BP 138/82 | HR 79 | Temp 98.4°F | Ht 66.0 in | Wt 216.0 lb

## 2013-07-21 DIAGNOSIS — K648 Other hemorrhoids: Secondary | ICD-10-CM | POA: Insufficient documentation

## 2013-07-21 NOTE — Progress Notes (Signed)
   Subjective:    Patient ID: Hannah Knight, female    DOB: 1955/03/27, 58 y.o.   MRN: [id number]  Alonza Bogus, MD  HPI USING NON-STIMULANT STOOL SOFTENER. NO RECTAL BLEEDING, PRESSURE, PAIN, ITCHING, BURNING, OR SOILING. BMs: 1-2X/DAY.   Past Medical History  Diagnosis Date  . Diabetes mellitus   . Hypertension   . Graves disease     with radiation, now on Synthroid    Past Surgical History  Procedure Laterality Date  . Colonoscopy    09/22/2007    SLF: moderate internal hemorrhoids, multiple 3-4 mm sessile polyps in splenic flexure, hyperplastic.   Marland Kitchen Colonoscopy  09/27/2002      Rehman:Small external hemorrhoids/Six tiny   polyps ablated by cold biopsy from sigmoid colon  . Cholecystectomy    . Tubal ligation    . Colonoscopy N/A 06/29/2013    Procedure: COLONOSCOPY;  Surgeon: Danie Binder, MD;  Location: [facility];  Service: Endoscopy;  Laterality: N/A;  10:00-moved to [place] notified pt  . Hemorrhoid banding  06/29/2013    Procedure: HEMORRHOID BANDING;  Surgeon: Danie Binder, MD;  Location: [facility];  Service: Endoscopy;;   Allergies  Allergen Reactions  . Ace Inhibitors Anaphylaxis and Swelling   Current Outpatient Prescriptions  Medication Sig Dispense Refill  . amLODipine (NORVASC) 5 MG tablet Take 5 mg by mouth daily.      .      . levothyroxine (SYNTHROID, LEVOTHROID) 75 MCG tablet Take 75 mcg by mouth daily before breakfast.      . losartan (COZAAR) 100 MG tablet Take 100 mg by mouth daily.      . metFORMIN (GLUCOPHAGE) 500 MG tablet Take 500 mg by mouth daily with breakfast.      .         Review of Systems     Objective:   Physical Exam  Vitals reviewed. Constitutional: She appears well-nourished. No distress.  HENT:  Head: Normocephalic and atraumatic.  Mouth/Throat: Oropharynx is clear and moist. No oropharyngeal exudate.  Eyes: Pupils are equal, round, and reactive to light. No scleral icterus.  Neck: Normal range  of motion. Neck supple.  Cardiovascular: Normal rate, regular rhythm and normal heart sounds.   Pulmonary/Chest: Effort normal and breath sounds normal.  Abdominal: Soft. Bowel sounds are normal. She exhibits no distension. There is no tenderness.          Assessment & Plan:

## 2013-07-21 NOTE — Patient Instructions (Signed)
DRINK WATER.   EAT FIBER.  AVOID CONSTIPATION.  FOLLOW UP IN 1-2 YEARS. MERRY CHRISTMAS AND HAPPY NEW YEAR!

## 2013-07-21 NOTE — Assessment & Plan Note (Signed)
DRINK WATER  EAT FIBER AVOID CONSTIPATION OPV IN 1-2 YEARS

## 2013-07-21 NOTE — Progress Notes (Signed)
Reminder in epic °

## 2013-07-28 NOTE — Progress Notes (Signed)
cc'd to pcp 

## 2013-08-10 LAB — MICROALBUMIN, URINE: Microalb, Ur: 0.66

## 2014-04-11 ENCOUNTER — Encounter: Payer: 59 | Attending: Pulmonary Disease | Admitting: Nutrition

## 2014-06-29 ENCOUNTER — Encounter: Payer: Self-pay | Admitting: Gastroenterology

## 2014-11-10 ENCOUNTER — Telehealth: Payer: Self-pay | Admitting: Nutrition

## 2014-11-10 NOTE — Telephone Encounter (Signed)
VM to call and reschedule missted appointment. PC

## 2014-12-12 ENCOUNTER — Ambulatory Visit: Payer: Self-pay | Admitting: "Endocrinology

## 2014-12-18 LAB — BASIC METABOLIC PANEL WITH GFR
BUN: 8 (ref 4–21)
Creatinine: 0.9 (ref 0.5–1.1)

## 2014-12-18 LAB — LIPID PANEL
Cholesterol: 129 (ref 0–200)
HDL: 63 (ref 35–70)
LDL Cholesterol: 53
Triglycerides: 61 (ref 40–160)

## 2014-12-18 LAB — HEMOGLOBIN A1C
Hemoglobin A1C: 7
Hemoglobin A1C: 7
Hemoglobin A1C: 7

## 2014-12-18 LAB — TSH: TSH: 1.87 (ref 0.41–5.90)

## 2015-06-07 LAB — TSH: TSH: 2.6 (ref 0.41–5.90)

## 2015-07-05 ENCOUNTER — Other Ambulatory Visit: Payer: Self-pay | Admitting: "Endocrinology

## 2015-07-10 ENCOUNTER — Other Ambulatory Visit: Payer: Self-pay | Admitting: "Endocrinology

## 2015-07-13 ENCOUNTER — Other Ambulatory Visit: Payer: Self-pay | Admitting: "Endocrinology

## 2015-07-21 ENCOUNTER — Other Ambulatory Visit: Payer: Self-pay | Admitting: "Endocrinology

## 2015-07-27 ENCOUNTER — Other Ambulatory Visit: Payer: Self-pay | Admitting: "Endocrinology

## 2015-08-02 ENCOUNTER — Other Ambulatory Visit: Payer: Self-pay | Admitting: "Endocrinology

## 2015-08-02 DIAGNOSIS — E039 Hypothyroidism, unspecified: Secondary | ICD-10-CM

## 2015-08-03 LAB — TSH: TSH: 6.14 u[IU]/mL — ABNORMAL HIGH (ref 0.450–4.500)

## 2015-08-03 LAB — T4, FREE: Free T4: 1.23 ng/dL (ref 0.82–1.77)

## 2015-08-03 LAB — T3, FREE: T3, Free: 2.3 pg/mL (ref 2.0–4.4)

## 2015-08-08 ENCOUNTER — Ambulatory Visit (INDEPENDENT_AMBULATORY_CARE_PROVIDER_SITE_OTHER): Payer: 59 | Admitting: "Endocrinology

## 2015-08-08 ENCOUNTER — Encounter: Payer: Self-pay | Admitting: "Endocrinology

## 2015-08-08 VITALS — BP 148/86 | HR 89 | Ht 66.0 in | Wt 213.0 lb

## 2015-08-08 DIAGNOSIS — I10 Essential (primary) hypertension: Secondary | ICD-10-CM

## 2015-08-08 DIAGNOSIS — E119 Type 2 diabetes mellitus without complications: Secondary | ICD-10-CM | POA: Diagnosis not present

## 2015-08-08 DIAGNOSIS — E6609 Other obesity due to excess calories: Secondary | ICD-10-CM | POA: Diagnosis not present

## 2015-08-08 DIAGNOSIS — E890 Postprocedural hypothyroidism: Secondary | ICD-10-CM | POA: Insufficient documentation

## 2015-08-08 MED ORDER — LEVOTHYROXINE SODIUM 88 MCG PO TABS: 88.0000 ug | ORAL_TABLET | Freq: Every day | ORAL | Status: DC

## 2015-08-08 MED ORDER — AMLODIPINE BESYLATE 10 MG PO TABS: 10.0000 mg | ORAL_TABLET | Freq: Every day | ORAL | Status: DC

## 2015-08-08 NOTE — Progress Notes (Signed)
Subjective:    Patient ID: Hannah Knight, female    DOB: 14-Jul-1955, PCP Alonza Bogus, MD   Past Medical History  Diagnosis Date  . Diabetes mellitus   . Hypertension   . Graves disease     with radiation, now on Synthroid   Past Surgical History  Procedure Laterality Date  . Colonoscopy    09/22/2007    SLF: moderate internal hemorrhoids, multiple 3-4 mm sessile polyps in splenic flexure, hyperplastic.   Marland Kitchen Colonoscopy  09/27/2002      Rehman:Small external hemorrhoids/Six tiny   polyps ablated by cold biopsy from sigmoid colon  . Cholecystectomy    . Tubal ligation    . Colonoscopy N/A 06/29/2013    Procedure: COLONOSCOPY;  Surgeon: Danie Binder, MD;  Location: [facility];  Service: Endoscopy;  Laterality: N/A;  10:00-moved to [place] notified pt  . Hemorrhoid banding  06/29/2013    Procedure: HEMORRHOID BANDING;  Surgeon: Danie Binder, MD;  Location: [facility];  Service: Endoscopy;;   Social History   Social History  . Marital Status: Married    Spouse Name: N/A  . Number of Children: N/A  . Years of Education: N/A   Occupational History  . Self-employed     sitter   Social History Main Topics  . Smoking status: Never Smoker   . Smokeless tobacco: None  . Alcohol Use: No  . Drug Use: No  . Sexual Activity: Not Asked   Other Topics Concern  . None   Social History Narrative   Outpatient Encounter Prescriptions as of 08/08/2015  Medication Sig  . amLODipine (NORVASC) 10 MG tablet Take 1 tablet (10 mg total) by mouth daily.  . hydrocortisone (ANUSOL-HC) 25 MG suppository Place 1 suppository (25 mg total) rectally every 12 (twelve) hours.  Marland Kitchen levothyroxine (SYNTHROID, LEVOTHROID) 88 MCG tablet Take 1 tablet (88 mcg total) by mouth daily before breakfast.  . losartan (COZAAR) 100 MG tablet Take 100 mg by mouth daily.  . metFORMIN (GLUCOPHAGE) 500 MG tablet Take 500 mg by mouth daily with breakfast.  . [DISCONTINUED] amLODipine  (NORVASC) 5 MG tablet Take 5 mg by mouth daily.  . [DISCONTINUED] levothyroxine (SYNTHROID, LEVOTHROID) 75 MCG tablet Take 75 mcg by mouth daily before breakfast.  . [DISCONTINUED] peg 3350 powder (MOVIPREP) 100 G SOLR Take 1 kit (200 g total) by mouth as directed. PHARMACIST USE THE FOLLOWING FOR PATIENT DISCOUNT BIN: [id number] GROUP: [id number] ID: [id number] PATIENTS WILL SAVE UP TO $10 ON THEIR OUT-OF-POCKET EXPENSE FOR PROCESSING QUESTIONS, CALL [phone]   No facility-administered encounter medications on file as of 08/08/2015.   ALLERGIES: Allergies  Allergen Reactions  . Ace Inhibitors Anaphylaxis and Swelling   VACCINATION STATUS:  There is no immunization history on file for this patient.  HPI 60 yr old female who underwent RAI therapy for hyperthyroidism on 02/03/11. 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 However she is now taking levothyroxine 75 g by mouth every morning. She has missed her appointments since more than a year ago. She returns for f/u.   No new complaints. denies cold/heat intolerance. she has no family hx of thyroid dysfunction. She has type 2 DM being followed by Dr. Luan Pulling with metformin.    Review of Systems Constitutional: no weight  change, no fatigue, no subjective hyperthermia/hypothermia Eyes: no blurry vision, no xerophthalmia ENT: no sore throat, no nodules palpated in throat, no dysphagia/odynophagia, no hoarseness Cardiovascular: no CP/SOB/palpitations/leg  swelling Respiratory: no cough/SOB Gastrointestinal: no N/V/D/C Musculoskeletal: no muscle/joint aches Skin: no rashes Neurological: no tremors/numbness/tingling/dizziness Psychiatric: no depression/anxiety  Objective:    BP 148/86 mmHg  Pulse 89  Ht 5' 6"  (1.676 m)  Wt 213 lb (96.616 kg)  BMI 34.40 kg/m2  SpO2 96%  Wt Readings from Last 3 Encounters:  08/08/15 213 lb (96.616 kg)  07/21/13 216 lb (97.977 kg)  06/17/13 214 lb 9.6 oz (97.342 kg)    Physical  Exam  Constitutional: overweight, in NAD Eyes: PERRLA, EOMI, no exophthalmos ENT: moist mucous membranes, no thyromegaly, no cervical lymphadenopathy Cardiovascular: RRR, No MRG Respiratory: CTA B Gastrointestinal: abdomen soft, NT, ND, BS+ Musculoskeletal: no deformities, strength intact in all 4 Skin: moist, warm, no rashes Neurological: no tremor with outstretched hands, DTR normal in all 4  Recent Results (from the past 2160 hour(s))  T3, Free     Status: None   Collection Time: 08/02/15  3:25 PM  Result Value Ref Range   T3, Free 2.3 2.0 - 4.4 pg/mL  T4, free     Status: None   Collection Time: 08/02/15  3:25 PM  Result Value Ref Range   Free T4 1.23 0.82 - 1.77 ng/dL  TSH     Status: Abnormal   Collection Time: 08/02/15  3:25 PM  Result Value Ref Range   TSH 6.140 (H) 0.450 - 4.500 uIU/mL     Assessment & Plan:   1. Hypothyroidism following radioiodine therapy -Her most recent labs are consistent with underage replacement with thyroid hormone. -I will increase her levothyroxine to 88 g by mouth every morning.  - We discussed about correct intake of levothyroxine, at fasting, with water, separated by at least 30 minutes from breakfast, and separated by more than 4 hours from calcium, iron, multivitamins, acid reflux medications (PPIs). -Patient is made aware of the fact that thyroid hormone replacement is needed for life, dose to be adjusted by periodic monitoring of thyroid function tests.  2. Diabetes mellitus without complication ([place]) -She reports her A1c was 6.9% with Dr. Luan Pulling. I advised her to continue metformin 500 mg by mouth twice a day.  3. Essential hypertension, benign -Uncontrolled: I have increased her amlodipine to 10 mg by mouth daily, along with her losartan 100 mg by mouth every morning.  4. Obesity due to excess calories -Detailed carbs and exercise regimen discussed with her.  - 25 minutes of time was spent on the care of this patient , 50%  of which was applied for counseling on diabetes complications and their preventions.  - I advised patient to maintain close follow up with HAWKINS,EDWARD L, MD for primary care needs. Follow up plan: Return in about 6 months (around 02/07/2016) for follow up with pre-visit labs.  Glade Lloyd, MD Phone: [phone]  Fax: [phone]   08/08/2015, 10:02 AM

## 2016-02-08 ENCOUNTER — Ambulatory Visit: Payer: 59 | Admitting: "Endocrinology

## 2016-02-12 ENCOUNTER — Telehealth: Payer: Self-pay

## 2016-02-12 NOTE — Telephone Encounter (Signed)
Call pharmacy to give permission to switch manufacturers of Levothyroxine. The original is unavailable for unknown length of time.

## 2016-03-11 ENCOUNTER — Other Ambulatory Visit: Payer: Self-pay | Admitting: "Endocrinology

## 2016-05-13 ENCOUNTER — Other Ambulatory Visit: Payer: Self-pay | Admitting: "Endocrinology

## 2016-05-15 ENCOUNTER — Other Ambulatory Visit: Payer: Self-pay | Admitting: "Endocrinology

## 2016-05-21 ENCOUNTER — Other Ambulatory Visit: Payer: Self-pay | Admitting: "Endocrinology

## 2016-05-22 ENCOUNTER — Other Ambulatory Visit: Payer: Self-pay | Admitting: "Endocrinology

## 2016-05-22 DIAGNOSIS — E119 Type 2 diabetes mellitus without complications: Secondary | ICD-10-CM | POA: Diagnosis not present

## 2016-05-22 DIAGNOSIS — E890 Postprocedural hypothyroidism: Secondary | ICD-10-CM | POA: Diagnosis not present

## 2016-05-23 LAB — COMPREHENSIVE METABOLIC PANEL
A/G RATIO: 1.5 (ref 1.2–2.2)
ALT: 10 IU/L (ref 0–32)
AST: 12 IU/L (ref 0–40)
Albumin: 4.6 g/dL (ref 3.5–5.5)
Alkaline Phosphatase: 82 IU/L (ref 39–117)
BILIRUBIN TOTAL: 0.3 mg/dL (ref 0.0–1.2)
BUN/Creatinine Ratio: 9 (ref 9–23)
BUN: 7 mg/dL (ref 6–24)
CALCIUM: 9.9 mg/dL (ref 8.7–10.2)
CHLORIDE: 102 mmol/L (ref 96–106)
CO2: 25 mmol/L (ref 18–29)
Creatinine, Ser: 0.78 mg/dL (ref 0.57–1.00)
GFR, EST AFRICAN AMERICAN: 96 mL/min/{1.73_m2} (ref >59)
GFR, EST NON AFRICAN AMERICAN: 83 mL/min/{1.73_m2} (ref >59)
Globulin, Total: 3 g/dL (ref 1.5–4.5)
Glucose: 119 mg/dL — ABNORMAL HIGH (ref 65–99)
Potassium: 3.9 mmol/L (ref 3.5–5.2)
SODIUM: 143 mmol/L (ref 134–144)
TOTAL PROTEIN: 7.6 g/dL (ref 6.0–8.5)

## 2016-05-23 LAB — HGB A1C W/O EAG: Hgb A1c MFr Bld: 6.7 % — ABNORMAL HIGH (ref 4.8–5.6)

## 2016-05-23 LAB — T4, FREE: Free T4: 1.34 ng/dL (ref 0.82–1.77)

## 2016-05-23 LAB — TSH: TSH: 6.45 u[IU]/mL — AB (ref 0.450–4.500)

## 2016-05-23 LAB — AMBIG ABBREV CMP14 DEFAULT

## 2016-05-29 ENCOUNTER — Encounter: Payer: Self-pay | Admitting: "Endocrinology

## 2016-05-29 ENCOUNTER — Ambulatory Visit (INDEPENDENT_AMBULATORY_CARE_PROVIDER_SITE_OTHER): Payer: 59 | Admitting: "Endocrinology

## 2016-05-29 VITALS — BP 138/87 | HR 76 | Ht 66.0 in | Wt 211.0 lb

## 2016-05-29 DIAGNOSIS — E890 Postprocedural hypothyroidism: Secondary | ICD-10-CM | POA: Diagnosis not present

## 2016-05-29 DIAGNOSIS — E6609 Other obesity due to excess calories: Secondary | ICD-10-CM

## 2016-05-29 DIAGNOSIS — I10 Essential (primary) hypertension: Secondary | ICD-10-CM | POA: Diagnosis not present

## 2016-05-29 DIAGNOSIS — E119 Type 2 diabetes mellitus without complications: Secondary | ICD-10-CM

## 2016-05-29 DIAGNOSIS — Z6834 Body mass index (BMI) 34.0-34.9, adult: Secondary | ICD-10-CM

## 2016-05-29 MED ORDER — METFORMIN HCL 500 MG PO TABS: 500.0000 mg | ORAL_TABLET | Freq: Every day | ORAL | 6 refills | Status: DC

## 2016-05-29 MED ORDER — LEVOTHYROXINE SODIUM 100 MCG PO TABS: ORAL_TABLET | ORAL | 6 refills | Status: DC

## 2016-05-29 NOTE — Progress Notes (Signed)
Subjective:    Patient ID: Hannah Knight, female    DOB: 1955/05/21, PCP Alonza Bogus, MD   Past Medical History:  Diagnosis Date  . Diabetes mellitus   . Graves disease    with radiation, now on Synthroid  . Hypertension    Past Surgical History:  Procedure Laterality Date  . CHOLECYSTECTOMY    . COLONOSCOPY    09/22/2007   SLF: moderate internal hemorrhoids, multiple 3-4 mm sessile polyps in splenic flexure, hyperplastic.   Marland Kitchen COLONOSCOPY  09/27/2002     Rehman:Small external hemorrhoids/Six tiny   polyps ablated by cold biopsy from sigmoid colon  . COLONOSCOPY N/A 06/29/2013   Procedure: COLONOSCOPY;  Surgeon: Danie Binder, MD;  [facility];  Service: Endoscopy;  Laterality: N/A;  10:00-moved to [place] notified pt  . HEMORRHOID BANDING  06/29/2013   Procedure: HEMORRHOID BANDING;  Surgeon: Danie Binder, MD;  [facility];  Service: Endoscopy;;  . TUBAL LIGATION     Social History   Social History  . Marital status: Married    Spouse name: N/A  . Number of children: N/A  . Years of education: N/A   Occupational History  . Self-employed     sitter   Social History Main Topics  . Smoking status: Never Smoker  . Smokeless tobacco: Never Used  . Alcohol use No  . Drug use: No  . Sexual activity: Not Asked   Other Topics Concern  . None   Social History Narrative  . None   Outpatient Encounter Prescriptions as of 05/29/2016  Medication Sig  . amLODipine (NORVASC) 10 MG tablet TAKE ONE TABLET BY MOUTH ONCE DAILY  . hydrocortisone (ANUSOL-HC) 25 MG suppository Place 1 suppository (25 mg total) rectally every 12 (twelve) hours.  Marland Kitchen levothyroxine (SYNTHROID, LEVOTHROID) 100 MCG tablet TAKE ONE TABLET BY MOUTH ONCE DAILY BEFORE  BREAKFAST  . losartan (COZAAR) 100 MG tablet Take 100 mg by mouth daily.  . metFORMIN (GLUCOPHAGE) 500 MG tablet Take 1 tablet (500 mg total) by mouth daily with breakfast.  . [DISCONTINUED]  levothyroxine (SYNTHROID, LEVOTHROID) 100 MCG tablet TAKE ONE TABLET BY MOUTH ONCE DAILY BEFORE  BREAKFAST  . [DISCONTINUED] levothyroxine (SYNTHROID, LEVOTHROID) 88 MCG tablet TAKE ONE TABLET BY MOUTH ONCE DAILY BEFORE  BREAKFAST  . [DISCONTINUED] metFORMIN (GLUCOPHAGE) 500 MG tablet Take 500 mg by mouth daily with breakfast.   No facility-administered encounter medications on file as of 05/29/2016.    ALLERGIES: Allergies  Allergen Reactions  . Ace Inhibitors Anaphylaxis and Swelling   VACCINATION STATUS:  There is no immunization history on file for this patient.  HPI   61 yr old female who underwent RAI therapy for hyperthyroidism on 02/03/11. 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  She returns for f/u, With repeat thyroid function tests showing higher TSH than target.   No new complaints. denies cold/heat intolerance. she has no family hx of thyroid dysfunction.  She has type 2 DM being followed by Dr. Luan Pulling with metformin.    Review of Systems Constitutional: no weight  change, no fatigue, no subjective hyperthermia/hypothermia Eyes: no blurry vision, no xerophthalmia ENT: no sore throat, no nodules palpated in throat, no dysphagia/odynophagia, no hoarseness Cardiovascular: no CP/SOB/palpitations/leg swelling Respiratory: no cough/SOB Gastrointestinal: no N/V/D/C Musculoskeletal: no muscle/joint aches Skin: no rashes Neurological: no tremors/numbness/tingling/dizziness Psychiatric: no depression/anxiety  Objective:    BP 138/87   Pulse 76   Ht 5'  6" (1.676 m)   Wt 211 lb (95.7 kg)   BMI 34.06 kg/m   Wt Readings from Last 3 Encounters:  05/29/16 211 lb (95.7 kg)  08/08/15 213 lb (96.6 kg)  07/21/13 216 lb (98 kg)    Physical Exam  Constitutional: overweight, in NAD Eyes: PERRLA, EOMI, no exophthalmos ENT: moist mucous membranes, no thyromegaly, no cervical lymphadenopathy Cardiovascular: RRR, No MRG Respiratory: CTA B Gastrointestinal:  abdomen soft, NT, ND, BS+ Musculoskeletal: no deformities, strength intact in all 4 Skin: moist, warm, no rashes Neurological: no tremor with outstretched hands, DTR normal in all 4  Recent Results (from the past 2160 hour(s))  Comprehensive metabolic panel     Status: Abnormal   Collection Time: 05/22/16 11:25 AM  Result Value Ref Range   Glucose 119 (H) 65 - 99 mg/dL   BUN 7 6 - 24 mg/dL   Creatinine, Ser 0.78 0.57 - 1.00 mg/dL   GFR calc non Af Amer 83 >59 mL/min/1.73   GFR calc Af Amer 96 >59 mL/min/1.73   BUN/Creatinine Ratio 9 9 - 23   Sodium 143 134 - 144 mmol/L   Potassium 3.9 3.5 - 5.2 mmol/L   Chloride 102 96 - 106 mmol/L   CO2 25 18 - 29 mmol/L   Calcium 9.9 8.7 - 10.2 mg/dL   Total Protein 7.6 6.0 - 8.5 g/dL   Albumin 4.6 3.5 - 5.5 g/dL   Globulin, Total 3.0 1.5 - 4.5 g/dL   Albumin/Globulin Ratio 1.5 1.2 - 2.2   Bilirubin Total 0.3 0.0 - 1.2 mg/dL   Alkaline Phosphatase 82 39 - 117 IU/L   AST 12 0 - 40 IU/L   ALT 10 0 - 32 IU/L  Hgb A1c w/o eAG     Status: Abnormal   Collection Time: 05/22/16 11:25 AM  Result Value Ref Range   Hgb A1c MFr Bld 6.7 (H) 4.8 - 5.6 %    Comment:          Pre-diabetes: 5.7 - 6.4          Diabetes: >6.4          Glycemic control for adults with diabetes: <7.0   T4, free     Status: None   Collection Time: 05/22/16 11:25 AM  Result Value Ref Range   Free T4 1.34 0.82 - 1.77 ng/dL  TSH     Status: Abnormal   Collection Time: 05/22/16 11:25 AM  Result Value Ref Range   TSH 6.450 (H) 0.450 - 4.500 uIU/mL  Ambig Abbrev CMP14 Default     Status: None   Collection Time: 05/22/16 11:25 AM  Result Value Ref Range   Ambig Abbrev CMP14 Default Comment     Comment: A hand-written panel/profile was received from your office. In accordance with the [facility] Ambiguous Test Code Policy dated July 6073, we have completed your order by using the closest currently or formerly recognized AMA panel.  We have assigned Comprehensive Metabolic Panel  (14), Test Code #322000 to this request.  If this is not the testing you wished to receive on this specimen, please contact the [place] Client Inquiry/Technical Services Department to clarify the test order.  We appreciate your business.      Assessment & Plan:   1. Hypothyroidism following radioiodine therapy -Her most recent labs are consistent with under- replacement with thyroid hormone. -I will increase her levothyroxine to 100 g by mouth every morning.  - We discussed about correct intake of levothyroxine, at fasting,  with water, separated by at least 30 minutes from breakfast, and separated by more than 4 hours from calcium, iron, multivitamins, acid reflux medications (PPIs). -Patient is made aware of the fact that thyroid hormone replacement is needed for life, dose to be adjusted by periodic monitoring of thyroid function tests.  2. Diabetes mellitus without complication ([place]) -She reports her A1c was 6.7% with Dr. Luan Pulling. I advised her to continue metformin 500 mg by mouth twice a day.  3. Essential hypertension, benign -Uncontrolled: I have increased her amlodipine to 10 mg by mouth daily, along with her losartan 100 mg by mouth every morning.  4. Obesity due to excess calories -Detailed carbs and exercise regimen discussed with her.  - 25 minutes of time was spent on the care of this patient , 50% of which was applied for counseling on diabetes complications and their preventions.  - I advised patient to maintain close follow up with HAWKINS,EDWARD L, MD for primary care needs. Follow up plan: Return in about 6 months (around 11/29/2016) for follow up with pre-visit labs.  Glade Lloyd, MD Phone: [phone]  Fax: [phone]   05/29/2016, 11:43 AM

## 2016-06-03 ENCOUNTER — Other Ambulatory Visit: Payer: Self-pay

## 2016-06-03 MED ORDER — AMLODIPINE BESYLATE 10 MG PO TABS: 10.0000 mg | ORAL_TABLET | Freq: Every day | ORAL | 3 refills | Status: DC

## 2016-11-29 ENCOUNTER — Ambulatory Visit: Payer: 59 | Admitting: "Endocrinology

## 2016-12-24 ENCOUNTER — Other Ambulatory Visit: Payer: Self-pay | Admitting: "Endocrinology

## 2016-12-25 DIAGNOSIS — E890 Postprocedural hypothyroidism: Secondary | ICD-10-CM | POA: Diagnosis not present

## 2016-12-27 LAB — TSH: TSH: 2.75 u[IU]/mL (ref 0.450–4.500)

## 2016-12-27 LAB — T4, FREE: Free T4: 2.01 ng/dL — ABNORMAL HIGH (ref 0.82–1.77)

## 2017-01-01 ENCOUNTER — Ambulatory Visit (INDEPENDENT_AMBULATORY_CARE_PROVIDER_SITE_OTHER): Payer: 59 | Admitting: "Endocrinology

## 2017-01-01 ENCOUNTER — Encounter: Payer: Self-pay | Admitting: "Endocrinology

## 2017-01-01 VITALS — BP 134/73 | HR 74 | Ht 66.0 in | Wt 211.0 lb

## 2017-01-01 DIAGNOSIS — E119 Type 2 diabetes mellitus without complications: Secondary | ICD-10-CM

## 2017-01-01 DIAGNOSIS — I10 Essential (primary) hypertension: Secondary | ICD-10-CM | POA: Diagnosis not present

## 2017-01-01 DIAGNOSIS — E890 Postprocedural hypothyroidism: Secondary | ICD-10-CM

## 2017-01-01 MED ORDER — LEVOTHYROXINE SODIUM 112 MCG PO TABS: ORAL_TABLET | ORAL | 6 refills | Status: DC

## 2017-01-01 NOTE — Progress Notes (Signed)
Subjective:    Patient ID: Hannah Knight, female    DOB: 1955/03/18, PCP Sinda Du, MD   Past Medical History:  Diagnosis Date  . Diabetes mellitus   . Graves disease    with radiation, now on Synthroid  . Hypertension    Past Surgical History:  Procedure Laterality Date  . CHOLECYSTECTOMY    . COLONOSCOPY    09/22/2007   SLF: moderate internal hemorrhoids, multiple 3-4 mm sessile polyps in splenic flexure, hyperplastic.   Marland Kitchen COLONOSCOPY  09/27/2002     Rehman:Small external hemorrhoids/Six tiny   polyps ablated by cold biopsy from sigmoid colon  . COLONOSCOPY N/A 06/29/2013   Procedure: COLONOSCOPY;  Surgeon: Danie Binder, MD;  [facility];  Service: Endoscopy;  Laterality: N/A;  10:00-moved to [place] notified pt  . HEMORRHOID BANDING  06/29/2013   Procedure: HEMORRHOID BANDING;  Surgeon: Danie Binder, MD;  [facility];  Service: Endoscopy;;  . TUBAL LIGATION     Social History   Social History  . Marital status: Married    Spouse name: N/A  . Number of children: N/A  . Years of education: N/A   Occupational History  . Self-employed     sitter   Social History Main Topics  . Smoking status: Never Smoker  . Smokeless tobacco: Never Used  . Alcohol use No  . Drug use: No  . Sexual activity: Not Asked   Other Topics Concern  . None   Social History Narrative  . None   Outpatient Encounter Prescriptions as of 01/01/2017  Medication Sig  . amLODipine (NORVASC) 10 MG tablet Take 1 tablet (10 mg total) by mouth daily.  . hydrocortisone (ANUSOL-HC) 25 MG suppository Place 1 suppository (25 mg total) rectally every 12 (twelve) hours.  Marland Kitchen levothyroxine (SYNTHROID, LEVOTHROID) 112 MCG tablet take 1 tablet by mouth once daily BEFORE BREAKFAST  . losartan (COZAAR) 100 MG tablet Take 100 mg by mouth daily.  . metFORMIN (GLUCOPHAGE) 500 MG tablet Take 1 tablet (500 mg total) by mouth daily with breakfast.  . [DISCONTINUED]  levothyroxine (SYNTHROID, LEVOTHROID) 100 MCG tablet take 1 tablet by mouth once daily BEFORE BREAKFAST   No facility-administered encounter medications on file as of 01/01/2017.    ALLERGIES: Allergies  Allergen Reactions  . Ace Inhibitors Anaphylaxis and Swelling   VACCINATION STATUS:  There is no immunization history on file for this patient.  HPI   61 yr old female who underwent RAI therapy for hyperthyroidism on 02/03/11. 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  She returns for f/u, With repeat thyroid function tests showing higher TSH than target.   No new complaints. denies cold/heat intolerance. she has no family hx of thyroid dysfunction.  She has controlled  type 2 DM being followed by Dr. Luan Pulling with metformin.    Review of Systems Constitutional: no weight  change, no fatigue, no subjective hyperthermia/hypothermia Eyes: no blurry vision, no xerophthalmia ENT: no sore throat, no nodules palpated in throat, no dysphagia/odynophagia, no hoarseness Cardiovascular: no CP/SOB/palpitations/leg swelling Respiratory: no cough/SOB Gastrointestinal: no N/V/D/C Musculoskeletal: no muscle/joint aches Skin: no rashes Neurological: no tremors/numbness/tingling/dizziness Psychiatric: no depression/anxiety  Objective:    BP 134/73   Pulse 74   Ht 5\' 6"  (1.676 m)   Wt 211 lb (95.7 kg)   BMI 34.06 kg/m   Wt Readings from Last 3 Encounters:  01/01/17 211 lb (95.7 kg)  05/29/16 211 lb (  95.7 kg)  08/08/15 213 lb (96.6 kg)    Physical Exam  Constitutional: overweight, in NAD Eyes: PERRLA, EOMI, no exophthalmos ENT: moist mucous membranes, no thyromegaly, no cervical lymphadenopathy Cardiovascular: RRR, No MRG Respiratory: CTA B Gastrointestinal: abdomen soft, NT, ND, BS+ Musculoskeletal: no deformities, strength intact in all 4 Skin: moist, warm, no rashes Neurological: no tremor with outstretched hands, DTR normal in all 4  Recent Results (from the  past 2160 hour(s))  TSH     Status: None   Collection Time: 12/25/16 12:30 PM  Result Value Ref Range   TSH 2.750 0.450 - 4.500 uIU/mL  T4, free     Status: Abnormal   Collection Time: 12/25/16 12:30 PM  Result Value Ref Range   Free T4 2.01 (H) 0.82 - 1.77 ng/dL     Assessment & Plan:   1. Hypothyroidism following radioiodine therapy -Her most recent labs are consistent with under- replacement with thyroid hormone. -I will increase her levothyroxine to 112 g by mouth every morning.  - We discussed about correct intake of levothyroxine, at fasting, with water, separated by at least 30 minutes from breakfast, and separated by more than 4 hours from calcium, iron, multivitamins, acid reflux medications (PPIs). -Patient is made aware of the fact that thyroid hormone replacement is needed for life, dose to be adjusted by periodic monitoring of thyroid function tests.  2. Diabetes mellitus without complication ([place]) -She reports her A1c was 6.7% with Dr. Luan Pulling. I advised her to continue metformin 500 mg by mouth twice a day.  3. Essential hypertension, benign -Uncontrolled: I have increased her amlodipine to 10 mg by mouth daily, along with her losartan 100 mg by mouth every morning.  4. Obesity due to excess calories -Detailed carbs and exercise regimen discussed with her.  - I advised patient to maintain close follow up with Sinda Du, MD for primary care needs. Follow up plan: Return in about 6 months (around 07/01/2017) for follow up with pre-visit labs.  Glade Lloyd, MD Phone: [phone]  Fax: [phone]  -  This note was partially dictated with voice recognition software. Similar sounding words can be transcribed inadequately or may not  be corrected upon review.  01/01/2017, 10:25 AM

## 2017-02-21 DIAGNOSIS — I10 Essential (primary) hypertension: Secondary | ICD-10-CM | POA: Diagnosis not present

## 2017-02-21 DIAGNOSIS — E785 Hyperlipidemia, unspecified: Secondary | ICD-10-CM | POA: Diagnosis not present

## 2017-02-21 DIAGNOSIS — E039 Hypothyroidism, unspecified: Secondary | ICD-10-CM | POA: Diagnosis not present

## 2017-03-03 ENCOUNTER — Other Ambulatory Visit (HOSPITAL_COMMUNITY): Payer: Self-pay | Admitting: Pulmonary Disease

## 2017-03-03 ENCOUNTER — Ambulatory Visit (HOSPITAL_COMMUNITY)
Admission: RE | Admit: 2017-03-03 | Discharge: 2017-03-03 | Disposition: A | Discharge status: Home / Self Care | Payer: 59 | Source: Ambulatory Visit | Attending: Pulmonary Disease | Admitting: Pulmonary Disease

## 2017-03-03 DIAGNOSIS — R0600 Dyspnea, unspecified: Secondary | ICD-10-CM | POA: Diagnosis not present

## 2017-03-03 DIAGNOSIS — J209 Acute bronchitis, unspecified: Secondary | ICD-10-CM | POA: Diagnosis not present

## 2017-03-03 DIAGNOSIS — I10 Essential (primary) hypertension: Secondary | ICD-10-CM | POA: Diagnosis not present

## 2017-03-03 DIAGNOSIS — R05 Cough: Secondary | ICD-10-CM | POA: Diagnosis not present

## 2017-03-18 DIAGNOSIS — E1165 Type 2 diabetes mellitus with hyperglycemia: Secondary | ICD-10-CM | POA: Diagnosis not present

## 2017-03-18 DIAGNOSIS — I10 Essential (primary) hypertension: Secondary | ICD-10-CM | POA: Diagnosis not present

## 2017-03-18 DIAGNOSIS — E785 Hyperlipidemia, unspecified: Secondary | ICD-10-CM | POA: Diagnosis not present

## 2017-03-18 DIAGNOSIS — E039 Hypothyroidism, unspecified: Secondary | ICD-10-CM | POA: Diagnosis not present

## 2017-05-23 DIAGNOSIS — E785 Hyperlipidemia, unspecified: Secondary | ICD-10-CM | POA: Diagnosis not present

## 2017-05-23 DIAGNOSIS — I10 Essential (primary) hypertension: Secondary | ICD-10-CM | POA: Diagnosis not present

## 2017-05-23 DIAGNOSIS — E1165 Type 2 diabetes mellitus with hyperglycemia: Secondary | ICD-10-CM | POA: Diagnosis not present

## 2017-07-14 ENCOUNTER — Encounter: Payer: Self-pay | Admitting: "Endocrinology

## 2017-07-14 ENCOUNTER — Ambulatory Visit: Payer: 59 | Admitting: "Endocrinology

## 2017-07-24 ENCOUNTER — Other Ambulatory Visit: Payer: Self-pay

## 2017-07-24 MED ORDER — LEVOTHYROXINE SODIUM 112 MCG PO TABS: ORAL_TABLET | ORAL | 3 refills | Status: DC

## 2017-10-07 DIAGNOSIS — G5603 Carpal tunnel syndrome, bilateral upper limbs: Secondary | ICD-10-CM | POA: Diagnosis not present

## 2017-10-07 DIAGNOSIS — I10 Essential (primary) hypertension: Secondary | ICD-10-CM | POA: Diagnosis not present

## 2017-10-07 DIAGNOSIS — E1165 Type 2 diabetes mellitus with hyperglycemia: Secondary | ICD-10-CM | POA: Diagnosis not present

## 2017-10-14 DIAGNOSIS — G5603 Carpal tunnel syndrome, bilateral upper limbs: Secondary | ICD-10-CM | POA: Diagnosis not present

## 2017-10-14 DIAGNOSIS — E1165 Type 2 diabetes mellitus with hyperglycemia: Secondary | ICD-10-CM | POA: Diagnosis not present

## 2017-10-14 DIAGNOSIS — I10 Essential (primary) hypertension: Secondary | ICD-10-CM | POA: Diagnosis not present

## 2017-11-29 ENCOUNTER — Other Ambulatory Visit: Payer: Self-pay | Admitting: "Endocrinology

## 2017-12-17 ENCOUNTER — Other Ambulatory Visit: Payer: Self-pay | Admitting: "Endocrinology

## 2017-12-17 DIAGNOSIS — E119 Type 2 diabetes mellitus without complications: Secondary | ICD-10-CM | POA: Diagnosis not present

## 2017-12-17 DIAGNOSIS — E890 Postprocedural hypothyroidism: Secondary | ICD-10-CM | POA: Diagnosis not present

## 2017-12-18 LAB — T4, FREE: Free T4: 1.6 ng/dL (ref 0.82–1.77)

## 2017-12-18 LAB — COMPREHENSIVE METABOLIC PANEL
ALT: 7 IU/L (ref 0–32)
AST: 8 IU/L (ref 0–40)
Albumin/Globulin Ratio: 1.6 (ref 1.2–2.2)
Albumin: 4.3 g/dL (ref 3.6–4.8)
Alkaline Phosphatase: 88 IU/L (ref 39–117)
BUN/Creatinine Ratio: 14 (ref 12–28)
BUN: 12 mg/dL (ref 8–27)
Bilirubin Total: 0.4 mg/dL (ref 0.0–1.2)
CALCIUM: 10 mg/dL (ref 8.7–10.3)
CO2: 22 mmol/L (ref 20–29)
CREATININE: 0.88 mg/dL (ref 0.57–1.00)
Chloride: 102 mmol/L (ref 96–106)
GFR calc Af Amer: 83 mL/min/{1.73_m2} (ref >59)
GFR calc non Af Amer: 72 mL/min/{1.73_m2} (ref >59)
GLOBULIN, TOTAL: 2.7 g/dL (ref 1.5–4.5)
GLUCOSE: 124 mg/dL — AB (ref 65–99)
Potassium: 4 mmol/L (ref 3.5–5.2)
Sodium: 141 mmol/L (ref 134–144)
Total Protein: 7 g/dL (ref 6.0–8.5)

## 2017-12-18 LAB — TSH: TSH: 1.68 u[IU]/mL (ref 0.450–4.500)

## 2017-12-18 LAB — HGB A1C W/O EAG: HEMOGLOBIN A1C: 6.9 % — AB (ref 4.8–5.6)

## 2017-12-18 LAB — LIPID PANEL W/O CHOL/HDL RATIO
CHOLESTEROL TOTAL: 133 mg/dL (ref 100–199)
HDL: 56 mg/dL (ref >39)
LDL CALC: 63 mg/dL (ref 0–99)
TRIGLYCERIDES: 70 mg/dL (ref 0–149)
VLDL CHOLESTEROL CAL: 14 mg/dL (ref 5–40)

## 2017-12-18 LAB — SPECIMEN STATUS REPORT

## 2017-12-24 ENCOUNTER — Encounter: Payer: Self-pay | Admitting: "Endocrinology

## 2017-12-24 ENCOUNTER — Ambulatory Visit: Payer: 59 | Admitting: "Endocrinology

## 2017-12-24 VITALS — BP 133/82 | HR 71 | Ht 66.0 in | Wt 212.0 lb

## 2017-12-24 DIAGNOSIS — E119 Type 2 diabetes mellitus without complications: Secondary | ICD-10-CM | POA: Diagnosis not present

## 2017-12-24 DIAGNOSIS — E890 Postprocedural hypothyroidism: Secondary | ICD-10-CM

## 2017-12-24 MED ORDER — LEVOTHYROXINE SODIUM 112 MCG PO TABS: ORAL_TABLET | ORAL | 4 refills | Status: DC

## 2017-12-24 NOTE — Progress Notes (Signed)
Endocrinology follow-up note   Subjective:    Patient ID: Hannah Knight, female    DOB: 01-Aug-1955, PCP Sinda Du, MD   Past Medical History:  Diagnosis Date  . Diabetes mellitus   . Graves disease    with radiation, now on Synthroid  . Hypertension    Past Surgical History:  Procedure Laterality Date  . CHOLECYSTECTOMY    . COLONOSCOPY    09/22/2007   SLF: moderate internal hemorrhoids, multiple 3-4 mm sessile polyps in splenic flexure, hyperplastic.   Marland Kitchen COLONOSCOPY  09/27/2002     Rehman:Small external hemorrhoids/Six tiny   polyps ablated by cold biopsy from sigmoid colon  . COLONOSCOPY N/A 06/29/2013   Procedure: COLONOSCOPY;  Surgeon: Danie Binder, MD;  [facility];  Service: Endoscopy;  Laterality: N/A;  10:00-moved to [place] notified pt  . HEMORRHOID BANDING  06/29/2013   Procedure: HEMORRHOID BANDING;  Surgeon: Danie Binder, MD;  [facility];  Service: Endoscopy;;  . TUBAL LIGATION     Social History   Socioeconomic History  . Marital status: Married    Spouse name: Not on file  . Number of children: Not on file  . Years of education: Not on file  . Highest education level: Not on file  Occupational History  . 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 Financial resource strain: Not on file  . Food insecurity:    Worry: Not on file    Inability: Not on file  . Transportation needs:    Medical: Not on file    Non-medical: Not on file  Tobacco Use  . Smoking status: Never Smoker  . Smokeless tobacco: Never Used  Substance and Sexual Activity  . Alcohol use: No  . Drug use: No  . Sexual activity: Not on file  Lifestyle  . Physical activity:    Days per week: Not on file    Minutes per session: Not on file  . Stress: Not on file  Relationships  . Social connections:    Talks on phone: Not on file    Gets together: Not on file    Attends religious service: Not on file    Active  member of club or organization: Not on file    Attends meetings of clubs or organizations: Not on file    Relationship status: Not on file  Other Topics Concern  . Not on file  Social History Narrative  . Not on file   Outpatient Encounter Medications as of 12/24/2017  Medication Sig  . amLODipine (NORVASC) 10 MG tablet Take 1 tablet (10 mg total) by mouth daily.  Marland Kitchen levothyroxine (SYNTHROID, LEVOTHROID) 112 MCG tablet take 1 tablet by mouth once daily BEFORE BREAKFAST  . losartan (COZAAR) 100 MG tablet Take 100 mg by mouth daily.  . metFORMIN (GLUCOPHAGE) 500 MG tablet Take 1 tablet (500 mg total) by mouth daily with breakfast. (Patient taking differently: Take 500 mg by mouth 2 (two) times daily with a meal. )  . [DISCONTINUED] hydrocortisone (ANUSOL-HC) 25 MG suppository Place 1 suppository (25 mg total) rectally every 12 (twelve) hours.  . [DISCONTINUED] levothyroxine (SYNTHROID, LEVOTHROID) 112 MCG tablet take 1 tablet by mouth once daily BEFORE BREAKFAST   No facility-administered encounter medications on file as of 12/24/2017.    ALLERGIES: Allergies  Allergen Reactions  . Ace Inhibitors Anaphylaxis and Swelling   VACCINATION STATUS:  There is no immunization history on file for  this patient.  HPI   62 yr old female who underwent RAI therapy for hyperthyroidism on 02/03/11. She is currently on levothyroxine 112 mcg p.o. every morning for  for RAI induced hypothyroidism.  She returns for f/u, with repeat thyroid function tests. -She continues to feel better, has no new complaints today.. She denies cold/heat intolerance. she has no family hx of thyroid dysfunction.  She has controlled  type 2 DM being followed by Dr. Luan Pulling with metformin.    Review of Systems Constitutional: + steady  weight  , no fatigue, no subjective hyperthermia/hypothermia Eyes: no blurry vision, no xerophthalmia ENT: no sore throat, no nodules palpated in throat, no dysphagia/odynophagia, no  hoarseness Musculoskeletal: no muscle/joint aches Skin: no rashes Neurological: no tremors/numbness/tingling/dizziness Psychiatric: no depression/anxiety  Objective:    BP 133/82   Pulse 71   Ht 5\' 6"  (1.676 m)   Wt 212 lb (96.2 kg)   BMI 34.22 kg/m   Wt Readings from Last 3 Encounters:  12/24/17 212 lb (96.2 kg)  01/01/17 211 lb (95.7 kg)  05/29/16 211 lb (95.7 kg)    Physical Exam  Constitutional:  + obese, in NAD Eyes: PERRLA, EOMI, no exophthalmos ENT: moist mucous membranes, no thyromegaly, no cervical lymphadenopathy Musculoskeletal: no deformities, strength intact in all 4 Skin: moist, warm, no rashes Neurological: No tremors of outstretched hands.     Recent Results (from the past 2160 hour(s))  Comprehensive metabolic panel     Status: Abnormal   Collection Time: 12/17/17 10:03 AM  Result Value Ref Range   Glucose 124 (H) 65 - 99 mg/dL   BUN 12 8 - 27 mg/dL   Creatinine, Ser 0.88 0.57 - 1.00 mg/dL   GFR calc non Af Amer 72 >59 mL/min/1.73   GFR calc Af Amer 83 >59 mL/min/1.73   BUN/Creatinine Ratio 14 12 - 28   Sodium 141 134 - 144 mmol/L   Potassium 4.0 3.5 - 5.2 mmol/L   Chloride 102 96 - 106 mmol/L   CO2 22 20 - 29 mmol/L   Calcium 10.0 8.7 - 10.3 mg/dL   Total Protein 7.0 6.0 - 8.5 g/dL   Albumin 4.3 3.6 - 4.8 g/dL   Globulin, Total 2.7 1.5 - 4.5 g/dL   Albumin/Globulin Ratio 1.6 1.2 - 2.2   Bilirubin Total 0.4 0.0 - 1.2 mg/dL   Alkaline Phosphatase 88 39 - 117 IU/L   AST 8 0 - 40 IU/L   ALT 7 0 - 32 IU/L  Lipid Panel w/o Chol/HDL Ratio     Status: None   Collection Time: 12/17/17 10:03 AM  Result Value Ref Range   Cholesterol, Total 133 100 - 199 mg/dL   Triglycerides 70 0 - 149 mg/dL   HDL 56 >39 mg/dL   VLDL Cholesterol Cal 14 5 - 40 mg/dL   LDL Calculated 63 0 - 99 mg/dL  Hgb A1c w/o eAG     Status: Abnormal   Collection Time: 12/17/17 10:03 AM  Result Value Ref Range   Hgb A1c MFr Bld 6.9 (H) 4.8 - 5.6 %    Comment:           Prediabetes: 5.7 - 6.4          Diabetes: >6.4          Glycemic control for adults with diabetes: <7.0   T4, free     Status: None   Collection Time: 12/17/17 10:03 AM  Result Value Ref Range   Free T4 1.60  0.82 - 1.77 ng/dL  TSH     Status: None   Collection Time: 12/17/17 10:03 AM  Result Value Ref Range   TSH 1.680 0.450 - 4.500 uIU/mL  Specimen status report     Status: None   Collection Time: 12/17/17 10:03 AM  Result Value Ref Range   specimen status report Comment     Comment: [facility]addy CMP14 Default Ambig Abbrev CMP14 Default A hand-written panel/profile was received from your office. In accordance with the [facility] Ambiguous Test Code Policy dated July 0177, we have completed your order by using the closest currently or formerly recognized AMA panel.  We have assigned Comprehensive Metabolic Panel (14), Test Code #322000 to this request.  If this is not the testing you wished to receive on this specimen, please contact the [place] Client Inquiry/Technical Services Department to clarify the test order.  We appreciate your business. Ambig Abbrev LP Default Ambig Abbrev LP Default A hand-written panel/profile was received from your office. In accordance with the [facility] Ambiguous Test Code Policy dated July 9390, we have completed your order by using the closest currently or formerly recognized AMA panel.  We have assigned Lipid Panel, Test Code [phone] to this request. If this is not the testing you wished to receive on this specimen, plea se contact the [place] Client Inquiry/Technical Services Department to clarify the test order.  We appreciate your business.      Assessment & Plan:   1. Hypothyroidism following radioiodine therapy -Her most recent labs are consistent with appropriate replacement with thyroid hormone.    -She is advised to continue levothyroxine to 112 g by mouth every morning.   - We discussed about correct intake of levothyroxine, at  fasting, with water, separated by at least 30 minutes from breakfast, and separated by more than 4 hours from calcium, iron, multivitamins, acid reflux medications (PPIs). -Patient is made aware of the fact that thyroid hormone replacement is needed for life, dose to be adjusted by periodic monitoring of thyroid function tests.  2. Diabetes mellitus without complication ([place]) -Her previsit labs show A1c of 6.9%.  She will continue to benefit from metformin therapy. I advised her to continue metformin 500 mg by mouth twice a day.  3. Essential hypertension, benign -Her blood pressure is controlled to target.  She is advised to continue amlodipine to 10 mg by mouth daily, along with her losartan 100 mg by mouth every morning.  4. Obesity due to excess calories -Detailed carbs and exercise regimen discussed with her. -  Suggestion is made for her to avoid simple carbohydrates  from her diet including Cakes, Sweet Desserts / Pastries, Ice Cream, Soda (diet and regular), Sweet Tea, Candies, Chips, Cookies, Store Bought Juices, Alcohol in Excess of  1-2 drinks a day, Artificial Sweeteners, and "Sugar-free" Products. This will help patient to have stable blood glucose profile and potentially avoid unintended weight gain.   - I advised patient to maintain close follow up with Sinda Du, MD for primary care needs. Follow up plan: Return in about 1 year (around 12/25/2018) for Follow up with Pre-visit Labs.  Glade Lloyd, MD Phone: [phone]  Fax: [phone]  -  This note was partially dictated with voice recognition software. Similar sounding words can be transcribed inadequately or may not  be corrected upon review.  12/24/2017, 5:01 PM

## 2018-02-05 DIAGNOSIS — I10 Essential (primary) hypertension: Secondary | ICD-10-CM | POA: Diagnosis not present

## 2018-02-05 DIAGNOSIS — E039 Hypothyroidism, unspecified: Secondary | ICD-10-CM | POA: Diagnosis not present

## 2018-02-05 DIAGNOSIS — E1165 Type 2 diabetes mellitus with hyperglycemia: Secondary | ICD-10-CM | POA: Diagnosis not present

## 2018-05-11 DIAGNOSIS — E039 Hypothyroidism, unspecified: Secondary | ICD-10-CM | POA: Diagnosis not present

## 2018-05-11 DIAGNOSIS — E669 Obesity, unspecified: Secondary | ICD-10-CM | POA: Diagnosis not present

## 2018-05-11 DIAGNOSIS — I10 Essential (primary) hypertension: Secondary | ICD-10-CM | POA: Diagnosis not present

## 2018-05-14 ENCOUNTER — Other Ambulatory Visit: Payer: Self-pay | Admitting: Pulmonary Disease

## 2018-05-14 ENCOUNTER — Other Ambulatory Visit (HOSPITAL_COMMUNITY): Payer: Self-pay | Admitting: Pulmonary Disease

## 2018-05-14 DIAGNOSIS — R1031 Right lower quadrant pain: Secondary | ICD-10-CM

## 2018-05-22 ENCOUNTER — Other Ambulatory Visit: Payer: Self-pay

## 2018-05-22 ENCOUNTER — Encounter (HOSPITAL_COMMUNITY): Payer: Self-pay

## 2018-05-22 ENCOUNTER — Ambulatory Visit (HOSPITAL_COMMUNITY)
Admission: RE | Admit: 2018-05-22 | Discharge: 2018-05-22 | Disposition: A | Discharge status: Home / Self Care | Payer: 59 | Source: Ambulatory Visit | Attending: Pulmonary Disease | Admitting: Pulmonary Disease

## 2018-05-22 DIAGNOSIS — R1031 Right lower quadrant pain: Secondary | ICD-10-CM | POA: Insufficient documentation

## 2018-05-22 DIAGNOSIS — D259 Leiomyoma of uterus, unspecified: Secondary | ICD-10-CM | POA: Diagnosis not present

## 2018-05-22 LAB — POCT I-STAT CREATININE: CREATININE: 0.8 mg/dL (ref 0.44–1.00)

## 2018-05-22 MED ORDER — IOHEXOL 300 MG/ML  SOLN
100.0000 mL | Freq: Once | INTRAMUSCULAR | Status: AC | PRN
  Administered 2018-05-22: 100 mL via INTRAVENOUS

## 2018-07-08 ENCOUNTER — Encounter: Payer: Self-pay | Admitting: Gastroenterology

## 2018-12-18 LAB — BASIC METABOLIC PANEL
BUN: 1 — AB (ref 4–21)
CO2: 23 — AB (ref 13–22)
Chloride: 104 (ref 99–108)
Creatinine: 0.9 (ref <=1.1)
Glucose: 159
Potassium: 3.8 (ref 3.4–5.3)
Sodium: 141 (ref 137–147)

## 2018-12-18 LAB — LIPID PANEL
Cholesterol: 129 (ref 0–200)
HDL: 63 (ref 35–70)
LDL Cholesterol: 53
Triglycerides: 61 (ref 40–160)

## 2018-12-18 LAB — COMPREHENSIVE METABOLIC PANEL
Albumin: 4.4 (ref 3.5–5.0)
Calcium: 9.8 (ref 8.7–10.7)
GFR calc Af Amer: 69
GFR calc non Af Amer: 0.9
Globulin: 2.4

## 2018-12-18 LAB — CBC AND DIFFERENTIAL
HCT: 37 (ref 36–46)
Hemoglobin: 11.9 — AB (ref 12.0–16.0)
Platelets: 307 (ref 150–399)
WBC: 6.7

## 2018-12-18 LAB — HEMOGLOBIN A1C: Hemoglobin A1C: 7

## 2018-12-18 LAB — HEPATIC FUNCTION PANEL
ALT: 11 (ref 7–35)
AST: 13 (ref 13–35)

## 2018-12-18 LAB — CBC: RBC: 4.47 (ref 3.87–5.11)

## 2018-12-23 ENCOUNTER — Ambulatory Visit: Payer: 59 | Admitting: Family Medicine

## 2018-12-25 ENCOUNTER — Ambulatory Visit (INDEPENDENT_AMBULATORY_CARE_PROVIDER_SITE_OTHER): Payer: 59 | Admitting: "Endocrinology

## 2018-12-25 ENCOUNTER — Encounter: Payer: Self-pay | Admitting: "Endocrinology

## 2018-12-25 ENCOUNTER — Other Ambulatory Visit: Payer: Self-pay

## 2018-12-25 DIAGNOSIS — I10 Essential (primary) hypertension: Secondary | ICD-10-CM

## 2018-12-25 DIAGNOSIS — E890 Postprocedural hypothyroidism: Secondary | ICD-10-CM | POA: Diagnosis not present

## 2018-12-25 DIAGNOSIS — E119 Type 2 diabetes mellitus without complications: Secondary | ICD-10-CM

## 2018-12-25 MED ORDER — LEVOTHYROXINE SODIUM 112 MCG PO TABS: ORAL_TABLET | ORAL | 3 refills | Status: DC

## 2018-12-25 NOTE — Progress Notes (Signed)
12/25/2018                              Endocrinology Telehealth Visit Follow up Note -During COVID -19 Pandemic  I connected with Hannah Knight on 12/25/2018   by telephone and verified that I am speaking with the correct person using two identifiers. Hannah Knight, 11-18-1955. she has verbally consented to this visit. All issues noted in this document were discussed and addressed. The format was not optimal for physical exam.   Subjective:    Patient ID: Hannah Knight, female    DOB: [birth year], PCP Sinda Du, MD   Past Medical History:  Diagnosis Date  . Diabetes mellitus   . Graves disease    with radiation, now on Synthroid  . Hypertension    Past Surgical History:  Procedure Laterality Date  . CHOLECYSTECTOMY    . COLONOSCOPY    09/22/2007   SLF: moderate internal hemorrhoids, multiple 3-4 mm sessile polyps in splenic flexure, hyperplastic.   Marland Kitchen COLONOSCOPY  09/27/2002     Rehman:Small external hemorrhoids/Six tiny   polyps ablated by cold biopsy from sigmoid colon  . COLONOSCOPY N/A 06/29/2013   Procedure: COLONOSCOPY;  Surgeon: Danie Binder, MD;  [facility];  Service: Endoscopy;  Laterality: N/A;  10:00-moved to [place] notified pt  . HEMORRHOID BANDING  06/29/2013   Procedure: HEMORRHOID BANDING;  Surgeon: Danie Binder, MD;  Location: [facility];  Service: Endoscopy;;  . TUBAL LIGATION     Social History   Socioeconomic History  . Marital status: Married    Spouse name: Not on file  . Number of children: Not on file  . Years of education: Not on file  . Highest education level: Not on file  Occupational History  . 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 Financial resource strain: Not on file  . Food insecurity    Worry: Not on file    Inability: Not on file  . Transportation needs    Medical: Not on file    Non-medical: Not on file  Tobacco Use  . Smoking status: Never  Smoker  . Smokeless tobacco: Never Used  Substance and Sexual Activity  . Alcohol use: No  . Drug use: No  . Sexual activity: Not on file  Lifestyle  . Physical activity    Days per week: Not on file    Minutes per session: Not on file  . Stress: Not on file  Relationships  . Social Herbalist on phone: Not on file    Gets together: Not on file    Attends religious service: Not on file    Active member of club or organization: Not on file    Attends meetings of clubs or organizations: Not on file    Relationship status: Not on file  Other Topics Concern  . Not on file  Social History Narrative  . Not on file   Outpatient Encounter Medications as of 12/25/2018  Medication Sig  . amLODipine (NORVASC) 10 MG tablet Take 1 tablet (10 mg total) by mouth daily.  Marland Kitchen levothyroxine (SYNTHROID) 112 MCG tablet take 1 tablet by mouth once daily BEFORE BREAKFAST  . losartan (COZAAR) 100 MG tablet Take 100 mg by mouth daily.  . metFORMIN (GLUCOPHAGE) 500 MG tablet Take 1 tablet (500 mg total) by mouth daily  with breakfast. (Patient taking differently: Take 500 mg by mouth 2 (two) times daily with a meal. )  . [DISCONTINUED] levothyroxine (SYNTHROID, LEVOTHROID) 112 MCG tablet take 1 tablet by mouth once daily BEFORE BREAKFAST   No facility-administered encounter medications on file as of 12/25/2018.    ALLERGIES: Allergies  Allergen Reactions  . Ace Inhibitors Anaphylaxis and Swelling   VACCINATION STATUS: Immunization History  Administered Date(s) Administered  . Tdap 01/03/2015    HPI   63 yr old female who underwent RAI therapy for hyperthyroidism on 02/03/11. She is currently on levothyroxine 112 mcg p.o. daily before breakfast for  RAI induced hypothyroidism.  She returns for f/u, with repeat thyroid function tests. -She continues to feel better, has no new complaints today.. She denies cold/heat intolerance. she has no family hx of thyroid dysfunction.  She has  controlled  type 2 DM being followed by Dr. Luan Pulling with metformin.  Her last A1c was 6.7%.   Review of Systems Limited as above.  Objective:    There were no vitals taken for this visit.  Wt Readings from Last 3 Encounters:  12/24/17 212 lb (96.2 kg)  01/01/17 211 lb (95.7 kg)  05/29/16 211 lb (95.7 kg)    December 18, 2018 labs show TSH 1.8 7, free T4 1.7 6, A1c 7%   Assessment & Plan:   1. Hypothyroidism following radioiodine therapy -Her previsit labs are consistent with appropriate replacement.  -She is advised to continue levothyroxine to 112 g by mouth every morning.   - We discussed about the correct intake of her thyroid hormone, on empty stomach at fasting, with water, separated by at least 30 minutes from breakfast and other medications,  and separated by more than 4 hours from calcium, iron, multivitamins, acid reflux medications (PPIs). -Patient is made aware of the fact that thyroid hormone replacement is needed for life, dose to be adjusted by periodic monitoring of thyroid function tests.    2. Diabetes mellitus without complication (HCC) -We will request for her recent labs from Dr. Luan Pulling. -Her previsit labs show A1c of 7%.  She will continue to benefit from metformin therapy. I advised her to continue metformin 500 mg by mouth twice a day.  3. Essential hypertension, benign -Her blood pressure is controlled to target.  She is advised to continue amlodipine to 10 mg by mouth daily, along with her losartan 100 mg by mouth every morning.     - I advised patient to maintain close follow up with Sinda Du, MD for primary care needs.   Time for this visit: 15 minutes. Hannah Knight  participated in the discussions, expressed understanding, and voiced agreement with the above plans.  All questions were answered to her satisfaction. she is encouraged to contact clinic should she have any questions or concerns prior to her return visit.  Follow  up plan: Return in about 1 year (around 12/25/2019) for Follow up with Pre-visit Labs, Next Visit A1c in Office.  Glade Lloyd, MD Phone: [phone]  Fax: [phone]  -  This note was partially dictated with voice recognition software. Similar sounding words can be transcribed inadequately or may not  be corrected upon review.  12/25/2018, 9:27 PM

## 2019-01-02 ENCOUNTER — Other Ambulatory Visit: Payer: Self-pay | Admitting: "Endocrinology

## 2019-01-21 ENCOUNTER — Other Ambulatory Visit: Payer: Self-pay

## 2019-01-21 ENCOUNTER — Ambulatory Visit: Payer: 59 | Admitting: Family Medicine

## 2019-01-21 ENCOUNTER — Encounter: Payer: Self-pay | Admitting: Family Medicine

## 2019-01-21 VITALS — BP 154/90 | HR 104 | Temp 98.6°F | Ht 66.0 in | Wt 217.2 lb

## 2019-01-21 DIAGNOSIS — E119 Type 2 diabetes mellitus without complications: Secondary | ICD-10-CM

## 2019-01-21 DIAGNOSIS — I10 Essential (primary) hypertension: Secondary | ICD-10-CM

## 2019-01-21 DIAGNOSIS — Z23 Encounter for immunization: Secondary | ICD-10-CM

## 2019-01-21 DIAGNOSIS — Z1239 Encounter for other screening for malignant neoplasm of breast: Secondary | ICD-10-CM

## 2019-01-21 LAB — POCT URINALYSIS DIP (CLINITEK)
Bilirubin, UA: NEGATIVE
Blood, UA: NEGATIVE
Glucose, UA: NEGATIVE mg/dL
Ketones, POC UA: NEGATIVE mg/dL
Leukocytes, UA: NEGATIVE
Nitrite, UA: NEGATIVE
POC PROTEIN,UA: NEGATIVE
Spec Grav, UA: 1.02 (ref 1.010–1.025)
Urobilinogen, UA: 0.2 E.U./dL
pH, UA: 6 (ref 5.0–8.0)

## 2019-01-21 LAB — POCT UA - MICROALBUMIN
A1c: <30
Creatinine, POC: 100 mg/dL
Microalbumin Ur, POC: 10 mg/L

## 2019-01-21 NOTE — Patient Instructions (Addendum)
Check with insurance on type of meter preferred Call with type of machine(meter) and we will call test strips and lancets to your pharmacy Test blood glucose fasting(first thing in the morning ) and 2 hours after your largest meal  Check blood pressure every morning and write it down Take Cozaar in the morning and Amlodipine at night  Make an eye appointment to have a diabetic eye exam!

## 2019-01-21 NOTE — Progress Notes (Signed)
New Patient Office Visit  Subjective:  Patient ID: Hannah Knight, female    DOB: [birth year]  Age: 63 y.o. MRN: [id number]  CC:  Chief Complaint  Patient presents with  . Establish Care  DM/HTN-stable  HPI Hannah Knight presents for DM DM-pt taking metformin BID with meals. Pt does not check glucose at home. A1c 7.0% in 10/20-needs eye exam Hypothyroid-manage by Dr. Junius Roads disease HTN-takes amlodipine and Cozaar-taking medication daily Hyperlipidemia-Lipitor-lipid panel-10/20 normal-stable  Past Medical History:  Diagnosis Date  . Allergy   . Arthritis   . Diabetes mellitus   . Graves disease    with radiation, now on Synthroid  . Graves disease   . Hypertension     Past Surgical History:  Procedure Laterality Date  . CHOLECYSTECTOMY    . COLONOSCOPY    09/22/2007   SLF: moderate internal hemorrhoids, multiple 3-4 mm sessile polyps in splenic flexure, hyperplastic.   Marland Kitchen COLONOSCOPY  09/27/2002     Rehman:Small external hemorrhoids/Six tiny   polyps ablated by cold biopsy from sigmoid colon  . COLONOSCOPY N/A 06/29/2013   Procedure: COLONOSCOPY;  Surgeon: Danie Binder, MD;  [facility];  Service: Endoscopy;  Laterality: N/A;  10:00-moved to [place] notified pt  . HEMORRHOID BANDING  06/29/2013   Procedure: HEMORRHOID BANDING;  Surgeon: Danie Binder, MD;  Location: [facility];  Service: Endoscopy;;  . TUBAL LIGATION      Family History  Problem Relation Age of Onset  . Colon cancer Father 8       deceased  . Diabetes Mother   . Heart disease Mother   . Cancer - Lung Brother     Social History   Socioeconomic History  . Marital status: Married    Spouse name: Not on file  . Number of children: Not on file  . Years of education: Not on file  . Highest education level: Not on file  Occupational History  . 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 Financial resource strain: Not on file  .  Food insecurity    Worry: Not on file    Inability: Not on file  . Transportation needs    Medical: Not on file    Non-medical: Not on file  Tobacco Use  . Smoking status: Never Smoker  . Smokeless tobacco: Never Used  Substance and Sexual Activity  . Alcohol use: No  . Drug use: No  . Sexual activity: Not on file  Lifestyle  . Physical activity    Days per week: Not on file    Minutes per session: Not on file  . Stress: Not on file  Relationships  . Social Herbalist on phone: Not on file    Gets together: Not on file    Attends religious service: Not on file    Active member of club or organization: Not on file    Attends meetings of clubs or organizations: Not on file    Relationship status: Not on file  . Intimate partner violence    Fear of current or ex partner: Not on file    Emotionally abused: Not on file    Physically abused: Not on file    Forced sexual activity: Not on file  Other Topics Concern  . Not on file  Social History Narrative  . Not on file    ROS Review of Systems  Constitutional: Negative.   HENT:  Negative.   Eyes:       Needs eye exam  Cardiovascular: Negative.   Gastrointestinal: Negative.   Endocrine:       Graves disease DM  Genitourinary: Negative.   Musculoskeletal: Negative.   Allergic/Immunologic: Negative.   Neurological: Negative.   Hematological: Negative.   Psychiatric/Behavioral: Negative.     Objective:   Today's Vitals: BP (!) 154/90 (BP Location: Left Leg, Patient Position: Sitting, Cuff Size: Normal)   Pulse (!) 104   Temp 98.6 F (37 C) (Oral)   Ht 5\' 6"  (1.676 m)   Wt 217 lb 3.2 oz (98.5 kg)   SpO2 98%   BMI 35.06 kg/m   Physical Exam Vitals signs reviewed.  Constitutional:      Appearance: Normal appearance.  HENT:     Head: Normocephalic and atraumatic.     Right Ear: External ear normal.     Left Ear: External ear normal.     Nose: Nose normal.  Neck:     Musculoskeletal: Normal range  of motion.  Cardiovascular:     Rate and Rhythm: Normal rate and regular rhythm.     Pulses: Normal pulses.     Heart sounds: Normal heart sounds.  Neurological:     Mental Status: She is alert and oriented to person, place, and time.  Psychiatric:        Mood and Affect: Mood normal.        Behavior: Behavior normal.     Assessment & Plan:  1. Screening breast examination - MM Digital Screening; Future  2. Essential hypertension, benign Not well controlled today-check with blood pressure cuff first thing in the morning - POCT URINALYSIS DIP (CLINITEK) - POCT UA - Microalbumin  3. Diabetes mellitus without complication ([place]) Continue metformin-check glucose readings fasting and after largest meal-check insurance for monitor Pt to call on eye exam appt - POCT URINALYSIS DIP (CLINITEK) - POCT UA - Microalbumin  4. Need for vaccination against Streptococcus pneumoniae using pneumococcal conjugate vaccine 13 - Pneumococcal conjugate vaccine 13-valent  Outpatient Encounter Medications as of 01/21/2019  Medication Sig  . atorvastatin (LIPITOR) 10 MG tablet Take 10 mg by mouth daily.  Marland Kitchen amLODipine (NORVASC) 10 MG tablet Take 1 tablet (10 mg total) by mouth daily.  Marland Kitchen levothyroxine (SYNTHROID) 112 MCG tablet TAKE 1 TABLET BY MOUTH EVERY MORNING BEFORE BREAKFAST  . losartan (COZAAR) 100 MG tablet Take 100 mg by mouth daily.  . metFORMIN (GLUCOPHAGE) 500 MG tablet Take 1 tablet (500 mg total) by mouth daily with breakfast. (Patient taking differently: Take 500 mg by mouth 2 (two) times daily with a meal. )   No facility-administered encounter medications on file as of 01/21/2019.     Follow-up: 1 month virtual with blood pressure readings and glucose readings  Chevette Fee Hannah Beat, MD

## 2019-01-23 DIAGNOSIS — Z1239 Encounter for other screening for malignant neoplasm of breast: Secondary | ICD-10-CM | POA: Insufficient documentation

## 2019-01-23 DIAGNOSIS — Z23 Encounter for immunization: Secondary | ICD-10-CM | POA: Insufficient documentation

## 2019-02-22 ENCOUNTER — Telehealth (INDEPENDENT_AMBULATORY_CARE_PROVIDER_SITE_OTHER): Payer: 59 | Admitting: Family Medicine

## 2019-02-22 ENCOUNTER — Other Ambulatory Visit: Payer: Self-pay

## 2019-02-22 DIAGNOSIS — Z7984 Long term (current) use of oral hypoglycemic drugs: Secondary | ICD-10-CM | POA: Diagnosis not present

## 2019-02-22 DIAGNOSIS — I10 Essential (primary) hypertension: Secondary | ICD-10-CM | POA: Diagnosis not present

## 2019-02-22 DIAGNOSIS — E039 Hypothyroidism, unspecified: Secondary | ICD-10-CM

## 2019-02-22 DIAGNOSIS — Z79890 Hormone replacement therapy (postmenopausal): Secondary | ICD-10-CM

## 2019-02-22 DIAGNOSIS — Z79899 Other long term (current) drug therapy: Secondary | ICD-10-CM

## 2019-02-22 DIAGNOSIS — E119 Type 2 diabetes mellitus without complications: Secondary | ICD-10-CM | POA: Diagnosis not present

## 2019-02-22 MED ORDER — ATORVASTATIN CALCIUM 10 MG PO TABS: 10.0000 mg | ORAL_TABLET | Freq: Every day | ORAL | 3 refills | Status: DC

## 2019-02-22 MED ORDER — LOSARTAN POTASSIUM 100 MG PO TABS: 100.0000 mg | ORAL_TABLET | Freq: Every day | ORAL | 3 refills | Status: DC

## 2019-02-22 MED ORDER — AMLODIPINE BESYLATE 10 MG PO TABS: 10.0000 mg | ORAL_TABLET | Freq: Every day | ORAL | 3 refills | Status: DC

## 2019-02-22 MED ORDER — LEVOTHYROXINE SODIUM 112 MCG PO TABS: ORAL_TABLET | ORAL | 3 refills | Status: DC

## 2019-02-22 MED ORDER — METFORMIN HCL 500 MG PO TABS: 500.0000 mg | ORAL_TABLET | Freq: Two times a day (BID) | ORAL | 5 refills | Status: DC

## 2019-02-22 MED ORDER — GLIPIZIDE 5 MG PO TABS: 5.0000 mg | ORAL_TABLET | Freq: Every day | ORAL | 1 refills | Status: DC

## 2019-02-22 NOTE — Progress Notes (Signed)
Virtual Visit via Telephone Note  I connected with Hannah Knight on 02/22/19 at  1:00 PM EST by telephone and verified that I am speaking with the correct person using two identifiers.DOB/address  Location: Patient: home Provider: office   I discussed the limitations, risks, security and privacy concerns of performing an evaluation and management service by telephone and the availability of in person appointments. I also discussed with the patient that there may be a patient responsible charge related to this service. The patient expressed understanding and agreed to proceed.   History of Present Illness: HTN-better to split dose with amlodipine at night and Cozaar in the morning   Observations/Objective: Highest  Blood pressure138/76 avg 130/77, 135/79 Glucose readings Am-135-146-highest 170 After meals-highest 171 Assessment and Plan: 1. Essential hypertension, benign Pt would like to continue amlodipine at night. Losartan during the day stable 2. Diabetes mellitus without complication (HCC) Add glipizide 5mg  before breakfast, continue metformin BID-A1c 7.0% 3. Hypothyroidism, unspecified type Reviewed TSH, levothyroid 132mcg daily Follow Up Instructions:1 month-viral  Continue to take blood pressure and glucose daily I discussed the assessment and treatment plan with the patient. The patient was provided an opportunity to ask questions and all were answered. The patient agreed with the plan and demonstrated an understanding of the instructions.   The patient was advised to call back or seek an in-person evaluation if the symptoms worsen or if the condition fails to improve as anticipated.  I provided 8 minutes of non-face-to-face time during this encounter.   Hannah Desroches Hannah Beat, MD

## 2019-04-08 ENCOUNTER — Other Ambulatory Visit: Payer: Self-pay

## 2019-04-08 ENCOUNTER — Ambulatory Visit: Payer: 59 | Attending: Internal Medicine

## 2019-04-08 DIAGNOSIS — Z20822 Contact with and (suspected) exposure to covid-19: Secondary | ICD-10-CM

## 2019-04-09 LAB — NOVEL CORONAVIRUS, NAA: SARS-CoV-2, NAA: NOT DETECTED

## 2019-05-06 ENCOUNTER — Ambulatory Visit: Payer: 59 | Attending: Internal Medicine

## 2019-05-06 DIAGNOSIS — Z23 Encounter for immunization: Secondary | ICD-10-CM

## 2019-05-06 NOTE — Progress Notes (Signed)
   Covid-19 Vaccination Clinic  Name:  Hannah Knight    MRN: [id number] DOB: [birth year]  05/06/2019  [name] was observed post Covid-19 immunization for 15 minutes without incident. She was provided with Vaccine Information Sheet and instruction to access the V-Safe system.   [name] was instructed to call 911 with any severe reactions post vaccine: Marland Kitchen Difficulty breathing  . Swelling of face and throat  . A fast heartbeat  . A bad rash all over body  . Dizziness and weakness   Immunizations Administered    Name Date Dose VIS Date Route   Moderna COVID-19 Vaccine 05/06/2019  8:40 AM 0.5 mL 02/02/2019 Intramuscular   Manufacturer: Moderna   Lot: [id number]   [id number]

## 2019-06-08 ENCOUNTER — Ambulatory Visit: Payer: 59 | Attending: Internal Medicine

## 2019-06-08 DIAGNOSIS — Z23 Encounter for immunization: Secondary | ICD-10-CM

## 2019-06-08 NOTE — Progress Notes (Signed)
   Covid-19 Vaccination Clinic  Name:  Hannah Knight    MRN: [id number] DOB: [birth year]  06/08/2019  [name] was observed post Covid-19 immunization for 15 minutes without incident. She was provided with Vaccine Information Sheet and instruction to access the V-Safe system.   [name] was instructed to call 911 with any severe reactions post vaccine: Marland Kitchen Difficulty breathing  . Swelling of face and throat  . A fast heartbeat  . A bad rash all over body  . Dizziness and weakness   Immunizations Administered    Name Date Dose VIS Date Route   Moderna COVID-19 Vaccine 06/08/2019  8:20 AM 0.5 mL 02/02/2019 Intramuscular   Manufacturer: Moderna   Lot: [id number]   [id number]

## 2019-07-28 ENCOUNTER — Encounter: Payer: Self-pay | Admitting: *Deleted

## 2019-10-12 ENCOUNTER — Ambulatory Visit: Payer: 59

## 2019-11-15 ENCOUNTER — Ambulatory Visit: Payer: 59

## 2019-12-13 ENCOUNTER — Other Ambulatory Visit: Payer: Self-pay

## 2019-12-13 ENCOUNTER — Ambulatory Visit (HOSPITAL_COMMUNITY)
Admission: RE | Admit: 2019-12-13 | Discharge: 2019-12-13 | Disposition: A | Discharge status: Home / Self Care | Payer: 59 | Source: Ambulatory Visit | Attending: Internal Medicine | Admitting: Internal Medicine

## 2019-12-13 ENCOUNTER — Other Ambulatory Visit (HOSPITAL_COMMUNITY): Payer: Self-pay | Admitting: Internal Medicine

## 2019-12-13 DIAGNOSIS — M542 Cervicalgia: Secondary | ICD-10-CM

## 2019-12-20 ENCOUNTER — Telehealth: Payer: Self-pay

## 2019-12-20 ENCOUNTER — Other Ambulatory Visit: Payer: Self-pay

## 2019-12-20 DIAGNOSIS — E890 Postprocedural hypothyroidism: Secondary | ICD-10-CM

## 2019-12-20 NOTE — Telephone Encounter (Signed)
Can you update her order for [facility]. Thanks

## 2019-12-20 NOTE — Telephone Encounter (Signed)
done

## 2019-12-22 LAB — TSH: TSH: 2.74 u[IU]/mL (ref 0.450–4.500)

## 2019-12-22 LAB — T4, FREE: Free T4: 1.82 ng/dL — ABNORMAL HIGH (ref 0.82–1.77)

## 2019-12-27 ENCOUNTER — Other Ambulatory Visit: Payer: Self-pay

## 2019-12-27 ENCOUNTER — Encounter: Payer: Self-pay | Admitting: Nurse Practitioner

## 2019-12-27 ENCOUNTER — Ambulatory Visit (INDEPENDENT_AMBULATORY_CARE_PROVIDER_SITE_OTHER): Payer: 59 | Admitting: Nurse Practitioner

## 2019-12-27 VITALS — BP 154/85 | HR 90 | Ht 66.0 in | Wt 209.4 lb

## 2019-12-27 DIAGNOSIS — E890 Postprocedural hypothyroidism: Secondary | ICD-10-CM | POA: Diagnosis not present

## 2019-12-27 DIAGNOSIS — I10 Essential (primary) hypertension: Secondary | ICD-10-CM | POA: Diagnosis not present

## 2019-12-27 DIAGNOSIS — E119 Type 2 diabetes mellitus without complications: Secondary | ICD-10-CM | POA: Diagnosis not present

## 2019-12-27 MED ORDER — LEVOTHYROXINE SODIUM 112 MCG PO TABS: ORAL_TABLET | ORAL | 3 refills | Status: DC

## 2019-12-27 NOTE — Progress Notes (Signed)
12/27/2019               Endocrinology Follow Up Visit   Subjective:    Patient ID: Hannah Knight, female    DOB: [birth year], PCP Rosita Fire, MD   Past Medical History:  Diagnosis Date   Allergy    Arthritis    Diabetes mellitus    Graves disease    with radiation, now on Synthroid   Graves disease    Hypertension    Past Surgical History:  Procedure Laterality Date   CHOLECYSTECTOMY     COLONOSCOPY    09/22/2007   SLF: moderate internal hemorrhoids, multiple 3-4 mm sessile polyps in splenic flexure, hyperplastic.    COLONOSCOPY  09/27/2002     Rehman:Small external hemorrhoids/Six tiny   polyps ablated by cold biopsy from sigmoid colon   COLONOSCOPY N/A 06/29/2013   Procedure: COLONOSCOPY;  Surgeon: Danie Binder, MD;  [facility]: [facility];  Service: Endoscopy;  Laterality: N/A;  10:00-moved to [place] notified pt   HEMORRHOID BANDING  06/29/2013   Procedure: HEMORRHOID BANDING;  Surgeon: Danie Binder, MD;  Location: [facility];  Service: Endoscopy;;   TUBAL LIGATION     Social History   Socioeconomic History   Marital status: Married    Spouse name: Not on file   Number of children: Not on file   Years of education: Not on file   Highest education level: Not on file  Occupational History   Occupation: Self-employed    Comment: sitter  Tobacco Use   Smoking status: Never Smoker   Smokeless tobacco: Never Used  Scientific laboratory technician Use: Never used  Substance and Sexual Activity   Alcohol use: No   Drug use: No   Sexual activity: Not on file  Other Topics Concern   Not on file  Social History Narrative   Not on file   Social Determinants of Health   Financial Resource Strain:    Difficulty of Paying Living Expenses: Not on file  Food Insecurity:    Worried About Charity fundraiser in the Last Year: Not on file   [employer] of Food in the Last Year: Not on file  Transportation Needs:    Lack of  Transportation (Medical): Not on file   Lack of Transportation (Non-Medical): Not on file  Physical Activity:    Days of Exercise per Week: Not on file   Minutes of Exercise per Session: Not on file  Stress:    Feeling of Stress : Not on file  Social Connections:    Frequency of Communication with Friends and Family: Not on file   Frequency of Social Gatherings with Friends and Family: Not on file   Attends Religious Services: Not on file   Active Member of Clubs or Organizations: Not on file   Attends Club or Organization Meetings: Not on file   Marital Status: Not on file   Outpatient Encounter Medications as of 12/27/2019  Medication Sig   amLODipine (NORVASC) 10 MG tablet Take 1 tablet (10 mg total) by mouth daily.   atorvastatin (LIPITOR) 10 MG tablet Take 1 tablet (10 mg total) by mouth daily.   levothyroxine (SYNTHROID) 112 MCG tablet TAKE 1 TABLET BY MOUTH EVERY MORNING BEFORE BREAKFAST   losartan (COZAAR) 100 MG tablet Take 1 tablet (100 mg total) by mouth daily.   metFORMIN (GLUCOPHAGE) 500 MG tablet Take 1 tablet (500 mg total) by mouth  2 (two) times daily with a meal.   [DISCONTINUED] levothyroxine (SYNTHROID) 112 MCG tablet TAKE 1 TABLET BY MOUTH EVERY MORNING BEFORE BREAKFAST   [DISCONTINUED] glipiZIDE (GLUCOTROL) 5 MG tablet Take 1 tablet (5 mg total) by mouth daily before breakfast.   No facility-administered encounter medications on file as of 12/27/2019.   ALLERGIES: Allergies  Allergen Reactions   Ace Inhibitors Anaphylaxis and Swelling   VACCINATION STATUS: Immunization History  Administered Date(s) Administered   Influenza-Unspecified 11/19/2018   Moderna SARS-COVID-2 Vaccination 05/06/2019, 06/08/2019   Pneumococcal Conjugate-13 01/21/2019   Tdap 01/03/2015    Diabetes She presents for her follow-up diabetic visit. She has type 2 diabetes mellitus. Her disease course has been stable. Pertinent negatives for hypoglycemia include no  headaches, nervousness/anxiousness or tremors. Pertinent negatives for diabetes include no fatigue, no polydipsia, no polyphagia, no polyuria and no weight loss. There are no hypoglycemic complications. Symptoms are stable. There are no diabetic complications. Risk factors for coronary artery disease include diabetes mellitus, dyslipidemia, hypertension, obesity and sedentary lifestyle. Current diabetic treatment includes oral agent (monotherapy). She is compliant with treatment most of the time. Her weight is stable. She is following a generally unhealthy diet. When asked about meal planning, she reported none. She has not had a previous visit with a dietitian. She participates in exercise intermittently. (She presents today with no meter or logs to review.  She does not regularly check her glucose at home.  Her previsit A1C was 6.7%, improving from last visit of 7%.  She denies any s/s of hypoglycemia.) An ACE inhibitor/angiotensin II receptor blocker is being taken. She does not see a podiatrist.Eye exam is current.  Thyroid Problem Presents for follow-up (underwent RAI therapy for hyperthyroidism on 02/03/11) visit. Patient reports no anxiety, cold intolerance, constipation, depressed mood, diarrhea, fatigue, heat intolerance, palpitations, tremors, weight gain or weight loss. The symptoms have been stable.     Review of systems  Constitutional: + Minimally fluctuating body weight,  current Body mass index is 33.8 kg/m. , no fatigue, no subjective hyperthermia, no subjective hypothermia Eyes: no blurry vision, no xerophthalmia ENT: no sore throat, no nodules palpated in throat, no dysphagia/odynophagia, no hoarseness Cardiovascular: no chest pain, no shortness of breath, no palpitations, no leg swelling Respiratory: no cough, no shortness of breath Gastrointestinal: no nausea/vomiting/diarrhea Musculoskeletal: no muscle/joint aches Skin: no rashes, no hyperemia Neurological: no tremors, no  numbness, no tingling, no dizziness Psychiatric: no depression, no anxiety  Objective:    BP (!) 154/85 (BP Location: Left Arm, Patient Position: Sitting)    Pulse 90    Ht 5\' 6"  (1.676 m)    Wt 209 lb 6.4 oz (95 kg)    BMI 33.80 kg/m   Wt Readings from Last 3 Encounters:  12/27/19 209 lb 6.4 oz (95 kg)  01/21/19 217 lb 3.2 oz (98.5 kg)  12/24/17 212 lb (96.2 kg)    BP Readings from Last 3 Encounters:  12/27/19 (!) 154/85  01/21/19 (!) 154/90  12/24/17 133/82     Physical Exam- Limited  Constitutional:  Body mass index is 33.8 kg/m. , not in acute distress, normal state of mind Eyes:  EOMI, no exophthalmos Neck: Supple Thyroid: No gross goiter Cardiovascular: RRR, no murmers, rubs, or gallops, no edema Respiratory: Adequate breathing efforts, no crackles, rales, rhonchi, or wheezing Musculoskeletal: no gross deformities, strength intact in all four extremities, no gross restriction of joint movements Skin:  no rashes, no hyperemia Neurological: no tremor with outstretched hands   BMET  Component Value Date/Time   NA 141 12/18/2018 0000   K 3.8 12/18/2018 0000   CL 104 12/18/2018 0000   CO2 23 (A) 12/18/2018 0000   GLUCOSE 124 (H) 12/17/2017 1003   BUN 1 (A) 12/18/2018 0000   CREATININE 0.9 12/18/2018 0000   CREATININE 0.80 05/22/2018 1212   CALCIUM 9.8 12/18/2018 0000   GFRNONAA 0.90 12/18/2018 0000   GFRAA 69 12/18/2018 0000    Lipid Panel     Component Value Date/Time   CHOL 129 12/18/2018 0000   CHOL 133 12/17/2017 1003   TRIG 61 12/18/2018 0000   HDL 63 12/18/2018 0000   HDL 56 12/17/2017 1003   LDLCALC 53 12/18/2018 0000   LDLCALC 63 12/17/2017 1003   LABVLDL 14 12/17/2017 1003    December 18, 2018 labs show TSH 1.8 7, free T4 1.7 6, A1c 7%   Assessment & Plan:   1. Hypothyroidism following radioiodine therapy -Her previsit labs are consistent with appropriate replacement.  -She is advised to continue levothyroxine to 112 g by mouth every  morning.   - We discussed about the correct intake of her thyroid hormone, on empty stomach at fasting, with water, separated by at least 30 minutes from breakfast and other medications,  and separated by more than 4 hours from calcium, iron, multivitamins, acid reflux medications (PPIs). -Patient is made aware of the fact that thyroid hormone replacement is needed for life, dose to be adjusted by periodic monitoring of thyroid function tests.  2. Diabetes mellitus without complication ([place])  She presents today with no meter or logs to review.  She does not regularly check her glucose at home.  Her previsit A1C was 6.7%, improving from last visit of 7%.  She denies any s/s of hypoglycemia.  - Nutritional counseling repeated at each appointment due to patients tendency to fall back in to old habits.  - The patient admits there is a room for improvement in their diet and drink choices. -  Suggestion is made for the patient to avoid simple carbohydrates from their diet including Cakes, Sweet Desserts / Pastries, Ice Cream, Soda (diet and regular), Sweet Tea, Candies, Chips, Cookies, Sweet Pastries,  Store Bought Juices, Alcohol in Excess of  1-2 drinks a day, Artificial Sweeteners, Coffee Creamer, and "Sugar-free" Products. This will help patient to have stable blood glucose profile and potentially avoid unintended weight gain.   - I encouraged the patient to switch to  unprocessed or minimally processed complex starch and increased protein intake (animal or plant source), fruits, and vegetables.   - Patient is advised to stick to a routine mealtimes to eat 3 meals  a day and avoid unnecessary snacks ( to snack only to correct hypoglycemia).  -Given her current control of diabetes, she will not need insulin at this time.  She is advised to continue Metformin 500 mg po twice daily with meals.  -Recent labs reviewed with her.  3. Essential hypertension, benign -Her blood pressure is controlled to  target.  She is advised to continue Amlodipine 10 mg po daily and Losartan 100 mg po daily.  4. Hyperlipidemia, mixed Her most recent lipid panel from 1016/20 shows controlled LDL of 53.  She is advised to continue Lipitor 10 mg po daily at bedtime.  Side effects and precautions discussed with patient.  5. Weight management Her Body mass index is 33.8 kg/m. She is a candidate for modest weight loss.  Carbs information and exercise plan discussed.  6. Chronic Care/Health  Maintenance: -Patient is on ACEI/ARB and Statin medications and encouraged to continue to follow up with Ophthalmology, Podiatrist at least yearly or according to recommendations, and advised to  stay away from smoking. I have recommended yearly flu vaccine and pneumonia vaccination at least every 5 years; moderate intensity exercise for up to 150 minutes weekly; and  sleep for at least 7 hours a day.  - I advised patient to maintain close follow up with Rosita Fire, MD for primary care needs.   - Time spent on this patient care encounter:  30 min, of which > 50% was spent in  counseling and the rest reviewing her blood glucose logs , discussing her hypoglycemia and hyperglycemia episodes, reviewing her current and  previous labs / studies  ( including abstraction from other facilities) and medications  doses and developing a  long term treatment plan and documenting her care.   Please refer to Patient Instructions for Blood Glucose Monitoring and Insulin/Medications Dosing Guide"  in media tab for additional information. Please  also refer to " Patient Self Inventory" in the Media  tab for reviewed elements of pertinent patient history.  Leonie Man Cariker participated in the discussions, expressed understanding, and voiced agreement with the above plans.  All questions were answered to her satisfaction. she is encouraged to contact clinic should she have any questions or concerns prior to her return visit.  Follow up  plan: Return in about 1 year (around 12/26/2020) for Diabetes follow up with A1c in office, Previsit labs, Thyroid follow up.  Rayetta Pigg, [facility] [address] [place], [place] [address] Phone: [phone] Fax: [phone]  12/27/2019, 1:34 PM

## 2019-12-27 NOTE — Patient Instructions (Signed)

## 2020-02-03 ENCOUNTER — Ambulatory Visit: Payer: 59 | Attending: Internal Medicine

## 2020-02-03 DIAGNOSIS — Z23 Encounter for immunization: Secondary | ICD-10-CM

## 2020-02-03 NOTE — Progress Notes (Signed)
   Covid-19 Vaccination Clinic  Name:  Hannah Knight    MRN: [id number] DOB: [birth year]  02/03/2020  Hannah Knight was observed post Covid-19 immunization for 30 minutes based on pre-vaccination screening without incident. She was provided with Vaccine Information Sheet and instruction to access the V-Safe system.   Hannah Knight was instructed to call 911 with any severe reactions post vaccine: Marland Kitchen Difficulty breathing  . Swelling of face and throat  . A fast heartbeat  . A bad rash all over body  . Dizziness and weakness   Immunizations Administered    No immunizations on file.

## 2020-06-23 LAB — LIPID PANEL
Cholesterol: 136 (ref 0–200)
HDL: 73 — AB (ref 35–70)
LDL Cholesterol: 51
Triglycerides: 55 (ref 40–160)

## 2020-06-23 LAB — BASIC METABOLIC PANEL
BUN: 6 (ref 4–21)
CO2: 21 (ref 13–22)
Chloride: 103 (ref 99–108)
Creatinine: 0.9 (ref 0.5–1.1)
Glucose: 114
Potassium: 3.7 (ref 3.4–5.3)
Sodium: 141 (ref 137–147)

## 2020-06-23 LAB — HEPATIC FUNCTION PANEL
ALT: 8 (ref 7–35)
AST: 11 — AB (ref 13–35)
Alkaline Phosphatase: 87 (ref 25–125)
Bilirubin, Total: 0.3

## 2020-06-23 LAB — COMPREHENSIVE METABOLIC PANEL
Albumin: 4.5 (ref 3.5–5.0)
Calcium: 9.7 (ref 8.7–10.7)
Globulin: 2.8

## 2020-06-23 LAB — TSH: TSH: 1.92 (ref 0.41–5.90)

## 2020-06-23 LAB — HEMOGLOBIN A1C: Hemoglobin A1C: 6.9

## 2020-06-27 ENCOUNTER — Other Ambulatory Visit (HOSPITAL_COMMUNITY): Payer: Self-pay | Admitting: Internal Medicine

## 2020-06-27 DIAGNOSIS — Z1231 Encounter for screening mammogram for malignant neoplasm of breast: Secondary | ICD-10-CM

## 2020-06-29 ENCOUNTER — Ambulatory Visit (HOSPITAL_COMMUNITY)
Admission: RE | Admit: 2020-06-29 | Discharge: 2020-06-29 | Disposition: A | Discharge status: Home / Self Care | Payer: 59 | Source: Ambulatory Visit | Attending: Internal Medicine | Admitting: Internal Medicine

## 2020-06-29 ENCOUNTER — Encounter (HOSPITAL_COMMUNITY): Payer: Self-pay | Admitting: Radiology

## 2020-06-29 DIAGNOSIS — Z1231 Encounter for screening mammogram for malignant neoplasm of breast: Secondary | ICD-10-CM | POA: Insufficient documentation

## 2020-12-19 ENCOUNTER — Other Ambulatory Visit: Payer: Self-pay | Admitting: Nurse Practitioner

## 2020-12-19 DIAGNOSIS — E890 Postprocedural hypothyroidism: Secondary | ICD-10-CM

## 2020-12-26 ENCOUNTER — Ambulatory Visit: Payer: 59 | Admitting: Nurse Practitioner

## 2020-12-26 ENCOUNTER — Telehealth: Payer: Self-pay

## 2020-12-26 ENCOUNTER — Encounter: Payer: Self-pay | Admitting: Gastroenterology

## 2020-12-26 ENCOUNTER — Other Ambulatory Visit: Payer: Self-pay

## 2020-12-26 ENCOUNTER — Ambulatory Visit: Payer: 59 | Admitting: Gastroenterology

## 2020-12-26 VITALS — BP 123/81 | HR 75 | Temp 97.3°F | Ht 66.0 in | Wt 203.4 lb

## 2020-12-26 DIAGNOSIS — Z800 Family history of malignant neoplasm of digestive organs: Secondary | ICD-10-CM | POA: Diagnosis not present

## 2020-12-26 DIAGNOSIS — R1310 Dysphagia, unspecified: Secondary | ICD-10-CM

## 2020-12-26 DIAGNOSIS — R1013 Epigastric pain: Secondary | ICD-10-CM | POA: Diagnosis not present

## 2020-12-26 MED ORDER — DICYCLOMINE HCL 10 MG PO CAPS: 10.0000 mg | ORAL_CAPSULE | Freq: Three times a day (TID) | ORAL | 1 refills | Status: DC

## 2020-12-26 MED ORDER — ONDANSETRON 4 MG PO TBDP: 4.0000 mg | ORAL_TABLET | Freq: Three times a day (TID) | ORAL | 3 refills | Status: DC | PRN

## 2020-12-26 MED ORDER — PANTOPRAZOLE SODIUM 40 MG PO TBEC: 40.0000 mg | DELAYED_RELEASE_TABLET | Freq: Every day | ORAL | 3 refills | Status: DC

## 2020-12-26 NOTE — Patient Instructions (Signed)
We are arranging a colonoscopy, upper endoscopy and dilation in near future by Dr. Abbey Chatters. Please do not take metformin that day of the procedure.  I have sent in pantoprazole (Protonix) to try once each morning, 30 minutes before breakfast. This is for reflux.  I sent in Zofran to take as needed for severe nausea. This can cause constipation, so just be aware of that side effect.  For looser stools and abdominal pain, I have sent in dicyclomine to try. Let's start with just once in morning before breakfast. You can increase to three times a day if needed. Side effects would be dry mouth, constipation, dizziness. Stop if any of these occur.  Please call if any significant abdominal pain or worsening! We will need to do imaging at that point.  It was a pleasure to see you today. I want to create trusting relationships with patients to provide genuine, compassionate, and quality care. I value your feedback. If you receive a survey regarding your visit,  I greatly appreciate you taking time to fill this out.   Annitta Needs, PhD, ANP-BC [facility]

## 2020-12-26 NOTE — H&P (View-Only) (Signed)
Referring Provider: Rosita Fire, MD Primary Care Physician:  Rosita Fire, MD Primary GI: Dr. Abbey Chatters, formerly Dr. Oneida Alar    Chief Complaint  Patient presents with   Consult    Discuss scheduling TCS    HPI:   DARYN PISANI is a 65 y.o. female presenting today with a family history of colon cancer in father at age 9, with last colonoscopy in 2015 s/p hemorrhoid banding at that time. She presents today to discuss scheduling high risk screening colonoscopy.   Will get nauseated and mouth filling with saliva intermittently. Spits it out. No vomiting. Sometimes will wake up nauseated in morning. Will eat and feel like she needs to belch but can't. No typical reflux symptoms. Had some pain the other day in epigastric/RUQ but didn't last long. Feels like food doesn't go "all the way down". Feels like food sits in epigastric region. No NSAIDs. Only tylenol products.   RLQ discomfort for a few weeks intermittently. Bowels have been more loose lately. No watery diarrhea. Used to be constipated. No constipation for a few months. Feels a little better in right side after BM. 2-3 bowel movements a day. No rectal bleeding. Doesn't eat as much as she used to eat because "the way I feel".    Past Medical History:  Diagnosis Date   Allergy    Arthritis    Diabetes mellitus    Graves disease    with radiation, now on Synthroid   Graves disease    Hypertension     Past Surgical History:  Procedure Laterality Date   CHOLECYSTECTOMY     COLONOSCOPY  09/22/2007   SLF: moderate internal hemorrhoids, multiple 3-4 mm sessile polyps in splenic flexure, hyperplastic.    COLONOSCOPY  09/27/2002   Rehman:Small external hemorrhoids/Six tiny   polyps ablated by cold biopsy from sigmoid colon   COLONOSCOPY N/A 06/29/2013   Moderate sized internal hemorrhoids. Sigmoid diverticulosis. Normal TI. Banding X 3.   HEMORRHOID BANDING  06/29/2013   Procedure: HEMORRHOID BANDING;  Surgeon:  Danie Binder, MD;  [facility];  Service: Endoscopy;;   TUBAL LIGATION      Current Outpatient Medications  Medication Sig Dispense Refill   amLODipine (NORVASC) 10 MG tablet Take 1 tablet (10 mg total) by mouth daily. 30 tablet 3   atorvastatin (LIPITOR) 10 MG tablet Take 1 tablet (10 mg total) by mouth daily. 90 tablet 3   dicyclomine (BENTYL) 10 MG capsule Take 1 capsule (10 mg total) by mouth 3 (three) times daily before meals. As needed for abdominal cramping and looser stool 90 capsule 1   levothyroxine (SYNTHROID) 112 MCG tablet TAKE 1 TABLET BY MOUTH EVERY MORNING BEFORE BREAKFAST 90 tablet 3   losartan (COZAAR) 100 MG tablet Take 1 tablet (100 mg total) by mouth daily. 90 tablet 3   metFORMIN (GLUCOPHAGE) 500 MG tablet Take 1 tablet (500 mg total) by mouth 2 (two) times daily with a meal. 120 tablet 5   ondansetron (ZOFRAN ODT) 4 MG disintegrating tablet Take 1 tablet (4 mg total) by mouth every 8 (eight) hours as needed for nausea or vomiting. 30 tablet 3   pantoprazole (PROTONIX) 40 MG tablet Take 1 tablet (40 mg total) by mouth daily. 30 minutes before breakfast 90 tablet 3   No current facility-administered medications for this visit.    Allergies as of 12/26/2020 - Review Complete 12/26/2020  Allergen Reaction Noted   Ace inhibitors Anaphylaxis and Swelling 01/31/2011  Family History  Problem Relation Age of Onset   Colon cancer Father 68       deceased   Diabetes Mother    Heart disease Mother    Cancer - Lung Brother     Social History   Socioeconomic History   Marital status: Married    Spouse name: Not on file   Number of children: Not on file   Years of education: Not on file   Highest education level: Not on file  Occupational History   Occupation: Self-employed    Comment: sitter  Tobacco Use   Smoking status: Never   Smokeless tobacco: Never  Vaping Use   Vaping Use: Never used  Substance and Sexual Activity   Alcohol use: No    Drug use: No   Sexual activity: Not on file  Other Topics Concern   Not on file  Social History Narrative   Not on file   Social Determinants of Health   Financial Resource Strain: Not on file  Food Insecurity: Not on file  Transportation Needs: Not on file  Physical Activity: Not on file  Stress: Not on file  Social Connections: Not on file    Review of Systems: Gen: Denies fever, chills, anorexia. Denies fatigue, weakness, weight loss.  CV: Denies chest pain, palpitations, syncope, peripheral edema, and claudication. Resp: Denies dyspnea at rest, cough, wheezing, coughing up blood, and pleurisy. GI: see HPI Derm: Denies rash, itching, dry skin Psych: Denies depression, anxiety, memory loss, confusion. No homicidal or suicidal ideation.  Heme: Denies bruising, bleeding, and enlarged lymph nodes.  Physical Exam: BP 123/81   Pulse 75   Temp (!) 97.3 F (36.3 C)   Ht 5\' 6"  (1.676 m)   Wt 203 lb 6.4 oz (92.3 kg)   BMI 32.83 kg/m  General:   Alert and oriented. No distress noted. Pleasant and cooperative.  Head:  Normocephalic and atraumatic. Eyes:  Conjuctiva clear without scleral icterus. Mouth:  Oral mucosa pink and moist. Good dentition. No lesions. Abdomen:  +BS, soft, very mild TTP RLQ and non-distended. No rebound or guarding. No HSM or masses noted. Msk:  Symmetrical without gross deformities. Normal posture. Extremities:  Without edema. Neurologic:  Alert and  oriented x4 Psych:  Alert and cooperative. Normal mood and affect.  ASSESSMENT: AIRAM RUNIONS is a 65 y.o. female presenting today with a family history of colon cancer in father at age 82, with last colonoscopy in 2015 s/p hemorrhoid banding at that time. She presents today to discuss scheduling high risk screening colonoscopy.   Noting intermittent nausea, food "sitting heavy" and vague solid food dysphagia. No typical GERD symptoms. Fleeting epigastric/RUQ pain recently. Denying NSAIDs. Needs EGD  +/- dilation at time of colonoscopy. Will also place on Protonix once daily.  More frequent bowel movements noted but no outright diarrhea, along with RLQ discomfort that is relieved thereafter. No obstructive symptoms. Will trial dicyclomine. Counseled to call us if worsening, and we would pursue CT. Arranging colonoscopy for high risk screening in near future.   PLAN:  Proceed with colonoscopy/EGD/dilation by Dr. Abbey Chatters  in near future: the risks, benefits, and alternatives have been discussed with the patient in detail. The patient states understanding and desires to proceed.   Start Protonix once each morning  Dicyclomine as needed  Zofran prn  Call if worsening RLQ pain and will pursue stat CT  Further recommendations to follow  Annitta Needs, PhD, ANP-BC [facility]

## 2020-12-26 NOTE — Progress Notes (Signed)
Referring Provider: Rosita Fire, MD Primary Care Physician:  Rosita Fire, MD Primary GI: Dr. Abbey Chatters, formerly Dr. Oneida Alar    Chief Complaint  Patient presents with   Consult    Discuss scheduling TCS    HPI:   Hannah Knight is a 65 y.o. female presenting today with a family history of colon cancer in father at age 79, with last colonoscopy in 2015 s/p hemorrhoid banding at that time. She presents today to discuss scheduling high risk screening colonoscopy.   Will get nauseated and mouth filling with saliva intermittently. Spits it out. No vomiting. Sometimes will wake up nauseated in morning. Will eat and feel like she needs to belch but can't. No typical reflux symptoms. Had some pain the other day in epigastric/RUQ but didn't last long. Feels like food doesn't go "all the way down". Feels like food sits in epigastric region. No NSAIDs. Only tylenol products.   RLQ discomfort for a few weeks intermittently. Bowels have been more loose lately. No watery diarrhea. Used to be constipated. No constipation for a few months. Feels a little better in right side after BM. 2-3 bowel movements a day. No rectal bleeding. Doesn't eat as much as she used to eat because "the way I feel".    Past Medical History:  Diagnosis Date   Allergy    Arthritis    Diabetes mellitus    Graves disease    with radiation, now on Synthroid   Graves disease    Hypertension     Past Surgical History:  Procedure Laterality Date   CHOLECYSTECTOMY     COLONOSCOPY  09/22/2007   SLF: moderate internal hemorrhoids, multiple 3-4 mm sessile polyps in splenic flexure, hyperplastic.    COLONOSCOPY  09/27/2002   Rehman:Small external hemorrhoids/Six tiny   polyps ablated by cold biopsy from sigmoid colon   COLONOSCOPY N/A 06/29/2013   Moderate sized internal hemorrhoids. Sigmoid diverticulosis. Normal TI. Banding X 3.   HEMORRHOID BANDING  06/29/2013   Procedure: HEMORRHOID BANDING;  Surgeon:  Danie Binder, MD;  Location: [facility];  Service: Endoscopy;;   TUBAL LIGATION      Current Outpatient Medications  Medication Sig Dispense Refill   amLODipine (NORVASC) 10 MG tablet Take 1 tablet (10 mg total) by mouth daily. 30 tablet 3   atorvastatin (LIPITOR) 10 MG tablet Take 1 tablet (10 mg total) by mouth daily. 90 tablet 3   dicyclomine (BENTYL) 10 MG capsule Take 1 capsule (10 mg total) by mouth 3 (three) times daily before meals. As needed for abdominal cramping and looser stool 90 capsule 1   levothyroxine (SYNTHROID) 112 MCG tablet TAKE 1 TABLET BY MOUTH EVERY MORNING BEFORE BREAKFAST 90 tablet 3   losartan (COZAAR) 100 MG tablet Take 1 tablet (100 mg total) by mouth daily. 90 tablet 3   metFORMIN (GLUCOPHAGE) 500 MG tablet Take 1 tablet (500 mg total) by mouth 2 (two) times daily with a meal. 120 tablet 5   ondansetron (ZOFRAN ODT) 4 MG disintegrating tablet Take 1 tablet (4 mg total) by mouth every 8 (eight) hours as needed for nausea or vomiting. 30 tablet 3   pantoprazole (PROTONIX) 40 MG tablet Take 1 tablet (40 mg total) by mouth daily. 30 minutes before breakfast 90 tablet 3   No current facility-administered medications for this visit.    Allergies as of 12/26/2020 - Review Complete 12/26/2020  Allergen Reaction Noted   Ace inhibitors Anaphylaxis and Swelling 01/31/2011  Family History  Problem Relation Age of Onset   Colon cancer Father 4       deceased   Diabetes Mother    Heart disease Mother    Cancer - Lung Brother     Social History   Socioeconomic History   Marital status: Married    Spouse name: Not on file   Number of children: Not on file   Years of education: Not on file   Highest education level: Not on file  Occupational History   Occupation: Self-employed    Comment: sitter  Tobacco Use   Smoking status: Never   Smokeless tobacco: Never  Vaping Use   Vaping Use: Never used  Substance and Sexual Activity   Alcohol use: No    Drug use: No   Sexual activity: Not on file  Other Topics Concern   Not on file  Social History Narrative   Not on file   Social Determinants of Health   Financial Resource Strain: Not on file  Food Insecurity: Not on file  Transportation Needs: Not on file  Physical Activity: Not on file  Stress: Not on file  Social Connections: Not on file    Review of Systems: Gen: Denies fever, chills, anorexia. Denies fatigue, weakness, weight loss.  CV: Denies chest pain, palpitations, syncope, peripheral edema, and claudication. Resp: Denies dyspnea at rest, cough, wheezing, coughing up blood, and pleurisy. GI: see HPI Derm: Denies rash, itching, dry skin Psych: Denies depression, anxiety, memory loss, confusion. No homicidal or suicidal ideation.  Heme: Denies bruising, bleeding, and enlarged lymph nodes.  Physical Exam: BP 123/81   Pulse 75   Temp (!) 97.3 F (36.3 C)   Ht 5\' 6"  (1.676 m)   Wt 203 lb 6.4 oz (92.3 kg)   BMI 32.83 kg/m  General:   Alert and oriented. No distress noted. Pleasant and cooperative.  Head:  Normocephalic and atraumatic. Eyes:  Conjuctiva clear without scleral icterus. Mouth:  Oral mucosa pink and moist. Good dentition. No lesions. Abdomen:  +BS, soft, very mild TTP RLQ and non-distended. No rebound or guarding. No HSM or masses noted. Msk:  Symmetrical without gross deformities. Normal posture. Extremities:  Without edema. Neurologic:  Alert and  oriented x4 Psych:  Alert and cooperative. Normal mood and affect.  ASSESSMENT: Hannah Knight is a 65 y.o. female presenting today with a family history of colon cancer in father at age 83, with last colonoscopy in 2015 s/p hemorrhoid banding at that time. She presents today to discuss scheduling high risk screening colonoscopy.   Noting intermittent nausea, food "sitting heavy" and vague solid food dysphagia. No typical GERD symptoms. Fleeting epigastric/RUQ pain recently. Denying NSAIDs. Needs EGD  +/- dilation at time of colonoscopy. Will also place on Protonix once daily.  More frequent bowel movements noted but no outright diarrhea, along with RLQ discomfort that is relieved thereafter. No obstructive symptoms. Will trial dicyclomine. Counseled to call us if worsening, and we would pursue CT. Arranging colonoscopy for high risk screening in near future.   PLAN:  Proceed with colonoscopy/EGD/dilation by Dr. Abbey Chatters  in near future: the risks, benefits, and alternatives have been discussed with the patient in detail. The patient states understanding and desires to proceed.   Start Protonix once each morning  Dicyclomine as needed  Zofran prn  Call if worsening RLQ pain and will pursue stat CT  Further recommendations to follow  Annitta Needs, PhD, ANP-BC [facility]

## 2020-12-26 NOTE — Telephone Encounter (Signed)
Called pt, TCS/EGD/DIL w/Propofol w/Dr. Abbey Chatters scheduled for 01/23/21 at 8:00am. Orders entered. Miralax instructions mailed. Roseanne Kaufman NP advised ok for Miralax prep.  PA for TCS/EGD/DIL submitted via [facility] website. PA# [id number], valid 01/23/21-04/23/21.

## 2021-01-23 ENCOUNTER — Other Ambulatory Visit: Payer: Self-pay

## 2021-01-23 ENCOUNTER — Encounter (HOSPITAL_COMMUNITY)
Admission: RE | Disposition: A | Discharge status: Home / Self Care | Payer: Self-pay | Source: Home / Self Care | Attending: Internal Medicine

## 2021-01-23 ENCOUNTER — Ambulatory Visit (HOSPITAL_COMMUNITY): Payer: 59 | Admitting: Anesthesiology

## 2021-01-23 ENCOUNTER — Ambulatory Visit (HOSPITAL_COMMUNITY)
Admission: RE | Admit: 2021-01-23 | Discharge: 2021-01-23 | Disposition: A | Discharge status: Home / Self Care | Payer: 59 | Attending: Internal Medicine | Admitting: Internal Medicine

## 2021-01-23 ENCOUNTER — Encounter (HOSPITAL_COMMUNITY): Payer: Self-pay

## 2021-01-23 DIAGNOSIS — E039 Hypothyroidism, unspecified: Secondary | ICD-10-CM | POA: Diagnosis not present

## 2021-01-23 DIAGNOSIS — R1310 Dysphagia, unspecified: Secondary | ICD-10-CM | POA: Diagnosis not present

## 2021-01-23 DIAGNOSIS — M1990 Unspecified osteoarthritis, unspecified site: Secondary | ICD-10-CM | POA: Diagnosis not present

## 2021-01-23 DIAGNOSIS — K635 Polyp of colon: Secondary | ICD-10-CM

## 2021-01-23 DIAGNOSIS — K3189 Other diseases of stomach and duodenum: Secondary | ICD-10-CM

## 2021-01-23 DIAGNOSIS — R1013 Epigastric pain: Secondary | ICD-10-CM | POA: Insufficient documentation

## 2021-01-23 DIAGNOSIS — Z7984 Long term (current) use of oral hypoglycemic drugs: Secondary | ICD-10-CM | POA: Insufficient documentation

## 2021-01-23 DIAGNOSIS — D3610 Benign neoplasm of peripheral nerves and autonomic nervous system, unspecified: Secondary | ICD-10-CM | POA: Insufficient documentation

## 2021-01-23 DIAGNOSIS — R1031 Right lower quadrant pain: Secondary | ICD-10-CM | POA: Diagnosis not present

## 2021-01-23 DIAGNOSIS — Z800 Family history of malignant neoplasm of digestive organs: Secondary | ICD-10-CM | POA: Insufficient documentation

## 2021-01-23 DIAGNOSIS — Z1211 Encounter for screening for malignant neoplasm of colon: Secondary | ICD-10-CM | POA: Insufficient documentation

## 2021-01-23 DIAGNOSIS — K648 Other hemorrhoids: Secondary | ICD-10-CM | POA: Insufficient documentation

## 2021-01-23 DIAGNOSIS — I10 Essential (primary) hypertension: Secondary | ICD-10-CM | POA: Diagnosis not present

## 2021-01-23 DIAGNOSIS — R1011 Right upper quadrant pain: Secondary | ICD-10-CM | POA: Diagnosis present

## 2021-01-23 DIAGNOSIS — K219 Gastro-esophageal reflux disease without esophagitis: Secondary | ICD-10-CM | POA: Diagnosis not present

## 2021-01-23 DIAGNOSIS — C162 Malignant neoplasm of body of stomach: Secondary | ICD-10-CM | POA: Insufficient documentation

## 2021-01-23 DIAGNOSIS — E119 Type 2 diabetes mellitus without complications: Secondary | ICD-10-CM | POA: Insufficient documentation

## 2021-01-23 HISTORY — PX: BIOPSY: SHX5522

## 2021-01-23 HISTORY — PX: ESOPHAGOGASTRODUODENOSCOPY (EGD) WITH PROPOFOL: SHX5813

## 2021-01-23 HISTORY — PX: COLONOSCOPY WITH PROPOFOL: SHX5780

## 2021-01-23 LAB — GLUCOSE, CAPILLARY: Glucose-Capillary: 144 mg/dL — ABNORMAL HIGH (ref 70–99)

## 2021-01-23 SURGERY — COLONOSCOPY WITH PROPOFOL
Anesthesia: General

## 2021-01-23 MED ORDER — LIDOCAINE HCL (CARDIAC) PF 100 MG/5ML IV SOSY
PREFILLED_SYRINGE | INTRAVENOUS | Status: DC | PRN
  Administered 2021-01-23: 50 mg via INTRAVENOUS

## 2021-01-23 MED ORDER — LACTATED RINGERS IV SOLN: INTRAVENOUS | Status: DC

## 2021-01-23 MED ORDER — PROPOFOL 10 MG/ML IV BOLUS
INTRAVENOUS | Status: DC | PRN
  Administered 2021-01-23: 100 mg via INTRAVENOUS

## 2021-01-23 MED ORDER — PROPOFOL 500 MG/50ML IV EMUL
INTRAVENOUS | Status: DC | PRN
  Administered 2021-01-23: 150 ug/kg/min via INTRAVENOUS

## 2021-01-23 NOTE — Interval H&P Note (Signed)
History and Physical Interval Note:  01/23/2021 7:24 AM  Hannah Knight  has presented today for surgery, with the diagnosis of family history of colon cancer, dyphagia, dyspepsia.  The various methods of treatment have been discussed with the patient and family. After consideration of risks, benefits and other options for treatment, the patient has consented to  Procedure(s) with comments: COLONOSCOPY WITH PROPOFOL (N/A) - 8:00am ESOPHAGOGASTRODUODENOSCOPY (EGD) WITH PROPOFOL (N/A) BALLOON DILATION (N/A) as a surgical intervention.  The patient's history has been reviewed, patient examined, no change in status, stable for surgery.  I have reviewed the patient's chart and labs.  Questions were answered to the patient's satisfaction.     Hannah Knight

## 2021-01-23 NOTE — Transfer of Care (Signed)
Immediate Anesthesia Transfer of Care Note  Patient: Hannah Knight  Procedure(s) Performed: COLONOSCOPY WITH PROPOFOL ESOPHAGOGASTRODUODENOSCOPY (EGD) WITH PROPOFOL BIOPSY  Patient Location: Endoscopy Unit  Anesthesia Type:General  Level of Consciousness: awake  Airway & Oxygen Therapy: Patient Spontanous Breathing  Post-op Assessment: Report given to RN and Post -op Vital signs reviewed and stable  Post vital signs: Reviewed and stable  Last Vitals:  Vitals Value Taken Time  BP    Temp    Pulse    Resp    SpO2      Last Pain:  Vitals:   01/23/21 0758  TempSrc:   PainSc: 9       Patients Stated Pain Goal: 10 (49/17/91 5056)  Complications: No notable events documented.

## 2021-01-23 NOTE — Discharge Instructions (Addendum)
EGD Discharge instructions Please read the instructions outlined below and refer to this sheet in the next few weeks. These discharge instructions provide you with general information on caring for yourself after you leave the hospital. Your doctor may also give you specific instructions. While your treatment has been planned according to the most current medical practices available, unavoidable complications occasionally occur. If you have any problems or questions after discharge, please call your doctor. ACTIVITY You may resume your regular activity but move at a slower pace for the next 24 hours.  Take frequent rest periods for the next 24 hours.  Walking will help expel (get rid of) the air and reduce the bloated feeling in your abdomen.  No driving for 24 hours (because of the anesthesia (medicine) used during the test).  You may shower.  Do not sign any important legal documents or operate any machinery for 24 hours (because of the anesthesia used during the test).  NUTRITION Drink plenty of fluids.  You may resume your normal diet.  Begin with a light meal and progress to your normal diet.  Avoid alcoholic beverages for 24 hours or as instructed by your caregiver.  MEDICATIONS You may resume your normal medications unless your caregiver tells you otherwise.  WHAT YOU CAN EXPECT TODAY You may experience abdominal discomfort such as a feeling of fullness or "gas" pains.  FOLLOW-UP Your doctor will discuss the results of your test with you.  SEEK IMMEDIATE MEDICAL ATTENTION IF ANY OF THE FOLLOWING OCCUR: Excessive nausea (feeling sick to your stomach) and/or vomiting.  Severe abdominal pain and distention (swelling).  Trouble swallowing.  Temperature over 101 F (37.8 C).  Rectal bleeding or vomiting of blood.     Colonoscopy Discharge Instructions  Read the instructions outlined below and refer to this sheet in the next few weeks. These discharge instructions provide you with  general information on caring for yourself after you leave the hospital. Your doctor may also give you specific instructions. While your treatment has been planned according to the most current medical practices available, unavoidable complications occasionally occur.   ACTIVITY You may resume your regular activity, but move at a slower pace for the next 24 hours.  Take frequent rest periods for the next 24 hours.  Walking will help get rid of the air and reduce the bloated feeling in your belly (abdomen).  No driving for 24 hours (because of the medicine (anesthesia) used during the test).   Do not sign any important legal documents or operate any machinery for 24 hours (because of the anesthesia used during the test).  NUTRITION Drink plenty of fluids.  You may resume your normal diet as instructed by your doctor.  Begin with a light meal and progress to your normal diet. Heavy or fried foods are harder to digest and may make you feel sick to your stomach (nauseated).  Avoid alcoholic beverages for 24 hours or as instructed.  MEDICATIONS You may resume your normal medications unless your doctor tells you otherwise.  WHAT YOU CAN EXPECT TODAY Some feelings of bloating in the abdomen.  Passage of more gas than usual.  Spotting of blood in your stool or on the toilet paper.  IF YOU HAD POLYPS REMOVED DURING THE COLONOSCOPY: No aspirin products for 7 days or as instructed.  No alcohol for 7 days or as instructed.  Eat a soft diet for the next 24 hours.  FINDING OUT THE RESULTS OF YOUR TEST Not all test results are  available during your visit. If your test results are not back during the visit, make an appointment with your caregiver to find out the results. Do not assume everything is normal if you have not heard from your caregiver or the medical facility. It is important for you to follow up on all of your test results.  SEEK IMMEDIATE MEDICAL ATTENTION IF: You have more than a spotting of  blood in your stool.  Your belly is swollen (abdominal distention).  You are nauseated or vomiting.  You have a temperature over 101.  You have abdominal pain or discomfort that is severe or gets worse throughout the day.   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  I took extensive biopsies of this area.  I am concerned about possible cancer though we will know more when we get these results back.  Your colonoscopy revealed 2 polyp(s) which I removed successfully. Await pathology results, my office will contact you. I recommend repeating colonoscopy in 5 years for surveillance purposes.   I will call you when I get biopsy results back and we will determine next steps.  I hope you have a great rest of your week!  Elon Alas. Abbey Chatters, D.O. Gastroenterology and Hepatology [facility]

## 2021-01-23 NOTE — Anesthesia Preprocedure Evaluation (Addendum)
Anesthesia Evaluation  Patient identified by MRN, date of birth, ID band Patient awake    Reviewed: Allergy & Precautions, H&P , NPO status , Patient's Chart, lab work & pertinent test results  History of Anesthesia Complications Negative for: history of anesthetic complications  Airway Mallampati: II  TM Distance: >3 FB Neck ROM: Full    Dental  (+) Dental Advisory Given, Missing   Pulmonary neg pulmonary ROS,    Pulmonary exam normal breath sounds clear to auscultation       Cardiovascular Exercise Tolerance: Good hypertension, Pt. on medications Normal cardiovascular exam Rhythm:Regular Rate:Normal     Neuro/Psych negative neurological ROS  negative psych ROS   GI/Hepatic Neg liver ROS, GERD (stopped taking protonix because of nausea)  ,  Endo/Other  diabetes, Well Controlled, Type 2, Oral Hypoglycemic AgentsHypothyroidism   Renal/GU negative Renal ROS  negative genitourinary   Musculoskeletal  (+) Arthritis , Osteoarthritis,    Abdominal   Peds negative pediatric ROS (+)  Hematology negative hematology ROS (+)   Anesthesia Other Findings   Reproductive/Obstetrics negative OB ROS                            Anesthesia Physical Anesthesia Plan  ASA: 2  Anesthesia Plan: General   Post-op Pain Management: Minimal or no pain anticipated   Induction:   PONV Risk Score and Plan: TIVA  Airway Management Planned: Nasal Cannula and Natural Airway  Additional Equipment:   Intra-op Plan:   Post-operative Plan:   Informed Consent: I have reviewed the patients History and Physical, chart, labs and discussed the procedure including the risks, benefits and alternatives for the proposed anesthesia with the patient or authorized representative who has indicated his/her understanding and acceptance.     Dental advisory given  Plan Discussed with: CRNA and Surgeon  Anesthesia Plan  Comments:         Anesthesia Quick Evaluation

## 2021-01-23 NOTE — Op Note (Signed)
[facility] Patient Name: Hannah Knight Procedure Date: 01/23/2021 7:51 AM MRN: [id number] Date of Birth: [birth year] Attending MD: Elon Alas. Abbey Chatters DO CSN: [id number] Age: 65 Admit Type: Outpatient Procedure:                Upper GI endoscopy Indications:              Epigastric abdominal pain, Dysphagia Providers:                Elon Alas. Abbey Chatters, DO, Janeece Riggers, RN, Nelma Rothman,                            Technician Referring MD:              Medicines:                See the Anesthesia note for documentation of the                            administered medications Complications:            No immediate complications. Estimated Blood Loss:     Estimated blood loss was minimal. Procedure:                Pre-Anesthesia Assessment:                           - The anesthesia plan was to use monitored                            anesthesia care (MAC).                           After obtaining informed consent, the endoscope was                            passed under direct vision. Throughout the                            procedure, the patient's blood pressure, pulse, and                            oxygen saturations were monitored continuously. The                            [id number]-[id number] ([id number]) scope was introduced through the                            mouth, and advanced to the second part of duodenum.                            The upper GI endoscopy was accomplished without                            difficulty. The patient tolerated the procedure                            well. Scope In: 8:01:56  AM Scope Out: 8:09:30 AM Total Procedure Duration: 0 hours 7 minutes 34 seconds  Findings:      There is no endoscopic evidence of bleeding, areas of erosion,       esophagitis, hiatal hernia, inflammation, ulcerations or varices in the       entire esophagus.      Localized mucosal changes were found in the gastric body. Circumfrential       mass/stricture. Mild  mucosal disruption with passing endosocpe through       lumen. Extensive biopsies were taken of the area with a cold forceps for       histology. Concern for malignancy.      The duodenal bulb, first portion of the duodenum and second portion of       the duodenum were normal. Impression:               - Mucosal changes in the gastric body.                            Circumfrential mass/stricture. Mild mucosal                            disruption with passing endosocpe through lumen.                            Extensive biopsies were taken of the area with a                            cold forceps for histology. Concern for underlying                            malignancy.                           - Normal duodenal bulb, first portion of the                            duodenum and second portion of the duodenum. Moderate Sedation:      Per Anesthesia Care Recommendation:           - Patient has a contact number available for                            emergencies. The signs and symptoms of potential                            delayed complications were discussed with the                            patient. Return to normal activities tomorrow.                            Written discharge instructions were provided to the                            patient.                           -  Resume previous diet.                           - Continue present medications.                           - Await pathology results.                           - Further recommendations after biopsy results                            reviewed. Procedure Code(s):        --- Professional ---                           [phone], Esophagogastroduodenoscopy, flexible,                            transoral; with biopsy, single or multiple Diagnosis Code(s):        --- Professional ---                           K31.89, Other diseases of stomach and duodenum                           R10.13, Epigastric pain                            R13.10, Dysphagia, unspecified CPT copyright 2019 American Medical Association. All rights reserved. The codes documented in this report are preliminary and upon coder review may  be revised to meet current compliance requirements. Elon Alas. Abbey Chatters, DO [facility] Abbey Chatters, DO 01/23/2021 8:37:16 AM This report has been signed electronically. Number of Addenda: 0

## 2021-01-23 NOTE — Anesthesia Postprocedure Evaluation (Signed)
Anesthesia Post Note  Patient: Lynnette Pote Mclarty  Procedure(s) Performed: COLONOSCOPY WITH PROPOFOL ESOPHAGOGASTRODUODENOSCOPY (EGD) WITH PROPOFOL BIOPSY  Patient location during evaluation: Endoscopy Anesthesia Type: General Level of consciousness: awake and alert and oriented Pain management: pain level controlled Vital Signs Assessment: post-procedure vital signs reviewed and stable Respiratory status: spontaneous breathing, nonlabored ventilation and respiratory function stable Cardiovascular status: blood pressure returned to baseline and stable Postop Assessment: no apparent nausea or vomiting Anesthetic complications: no   No notable events documented.   Last Vitals:  Vitals:   01/23/21 0652 01/23/21 0831  BP: (!) 153/89 130/87  Pulse: 91   Resp: 16 (!) 24  Temp: 36.9 C 36.7 C  SpO2: 98% 97%    Last Pain:  Vitals:   01/23/21 0831  TempSrc: Oral  PainSc: 0-No pain                 Niveah Boerner C Enslee Bibbins

## 2021-01-23 NOTE — Op Note (Signed)
[facility] Patient Name: Hannah Knight Procedure Date: 01/23/2021 8:11 AM MRN: [id number] Date of Birth: 12-May-1955 Attending MD: Elon Alas. Abbey Chatters DO CSN: [id number] Age: 65 Admit Type: Outpatient Procedure:                Colonoscopy Indications:              Screening in patient at increased risk: Colorectal                            cancer in father before age 53 Providers:                Elon Alas. Abbey Chatters, DO, Janeece Riggers, RN, Nelma Rothman,                            Technician Referring MD:              Medicines:                See the Anesthesia note for documentation of the                            administered medications Complications:            No immediate complications. Estimated Blood Loss:     Estimated blood loss was minimal. Procedure:                Pre-Anesthesia Assessment:                           - The anesthesia plan was to use monitored                            anesthesia care (MAC).                           After obtaining informed consent, the colonoscope                            was passed under direct vision. Throughout the                            procedure, the patient's blood pressure, pulse, and                            oxygen saturations were monitored continuously. The                            PCF-HQ190L ([id number]) scope was introduced through                            the anus and advanced to the the cecum, identified                            by appendiceal orifice and ileocecal valve. The                            colonoscopy was performed without  difficulty. The                            patient tolerated the procedure well. The quality                            of the bowel preparation was evaluated using the                            BBPS [name] Bowel Preparation Scale) with scores                            of: Right Colon = 3, Transverse Colon = 3 and Left                            Colon = 3 (entire mucosa  seen well with no residual                            staining, small fragments of stool or opaque                            liquid). The total BBPS score equals 9. Scope In: 8:15:57 AM Scope Out: 8:28:29 AM Scope Withdrawal Time: 0 hours 9 minutes 9 seconds  Total Procedure Duration: 0 hours 12 minutes 32 seconds  Findings:      The perianal and digital rectal examinations were normal.      Non-bleeding internal hemorrhoids were found during endoscopy.      Two sessile polyps were found in the descending colon. The polyps were 2       mm in size. These polyps were removed with a cold biopsy forceps.       Resection and retrieval were complete.      The exam was otherwise without abnormality. Impression:               - Non-bleeding internal hemorrhoids.                           - Two 2 mm polyps in the descending colon, removed                            with a cold biopsy forceps. Resected and retrieved.                           - The examination was otherwise normal. Moderate Sedation:      Per Anesthesia Care Recommendation:           - Patient has a contact number available for                            emergencies. The signs and symptoms of potential                            delayed complications were discussed with the  patient. Return to normal activities tomorrow.                            Written discharge instructions were provided to the                            patient.                           - Resume previous diet.                           - Continue present medications.                           - Await pathology results.                           - Repeat colonoscopy in 5 years for surveillance.                           - Return to GI clinic in 3 months. Procedure Code(s):        --- Professional ---                           [phone], Colonoscopy, flexible; with biopsy, single                            or multiple Diagnosis  Code(s):        --- Professional ---                           Z80.0, Family history of malignant neoplasm of                            digestive organs                           K63.5, Polyp of colon                           K64.8, Other hemorrhoids CPT copyright 2019 American Medical Association. All rights reserved. The codes documented in this report are preliminary and upon coder review may  be revised to meet current compliance requirements. Elon Alas. Abbey Chatters, DO [place] Abbey Chatters, DO 01/23/2021 8:31:22 AM This report has been signed electronically. Number of Addenda: 0

## 2021-01-24 ENCOUNTER — Encounter (HOSPITAL_COMMUNITY): Payer: Self-pay | Admitting: Internal Medicine

## 2021-01-24 ENCOUNTER — Telehealth: Payer: Self-pay | Admitting: Internal Medicine

## 2021-01-24 DIAGNOSIS — C169 Malignant neoplasm of stomach, unspecified: Secondary | ICD-10-CM

## 2021-01-24 LAB — SURGICAL PATHOLOGY

## 2021-01-24 NOTE — Addendum Note (Signed)
Addended by: Hassan Rowan on: 01/24/2021 02:14 PM   Modules accepted: Orders

## 2021-01-24 NOTE — Addendum Note (Signed)
Addended by: Hassan Rowan on: 01/24/2021 02:29 PM   Modules accepted: Orders

## 2021-01-24 NOTE — Telephone Encounter (Signed)
Patient underwent EGD yesterday for upper GI symptoms.  Found to have a large circumferential mass/stricture in her gastric body.  Pathologist called me today, unfortunately does appear that she has carcinoma.  I called and discussed these results with patient today.  Can we set her up for staging CT scans chest, abdomen, pelvis?  Can we also refer her to Dr. Raliegh Ip of oncology?  All patient's questions concerns have been answered.  Thank you

## 2021-01-24 NOTE — Addendum Note (Signed)
Addended by: Hassan Rowan on: 01/24/2021 02:49 PM   Modules accepted: Orders

## 2021-01-24 NOTE — Telephone Encounter (Signed)
No PA needed for CT chest, abd/pelvis per [facility] website.  CT chest/abd/pelvis w/contrast scheduled for 01/30/21 at 3:30pm, arrive at 3:15pm. Needs creatinine drawn prior to appt. Pickup contrast prior to appt. NPO 4 hours prior to test.  Called and informed pt of CT appt and details. She wants to go to [facility] lab for creatinine, order entered. Appt letter sent via [place].

## 2021-01-29 ENCOUNTER — Other Ambulatory Visit (HOSPITAL_COMMUNITY)
Admission: RE | Admit: 2021-01-29 | Discharge: 2021-01-29 | Disposition: A | Discharge status: Home / Self Care | Payer: 59 | Source: Ambulatory Visit | Attending: Internal Medicine | Admitting: Internal Medicine

## 2021-01-29 DIAGNOSIS — C169 Malignant neoplasm of stomach, unspecified: Secondary | ICD-10-CM | POA: Insufficient documentation

## 2021-01-29 LAB — CREATININE, SERUM
Creatinine, Ser: 0.94 mg/dL (ref 0.44–1.00)
GFR, Estimated: >60 mL/min (ref >60)

## 2021-01-30 ENCOUNTER — Ambulatory Visit (HOSPITAL_COMMUNITY): Admission: RE | Admit: 2021-01-30 | Payer: 59 | Source: Ambulatory Visit

## 2021-02-06 ENCOUNTER — Ambulatory Visit (HOSPITAL_COMMUNITY)
Admission: RE | Admit: 2021-02-06 | Discharge: 2021-02-06 | Disposition: A | Discharge status: Home / Self Care | Payer: 59 | Source: Ambulatory Visit | Attending: Internal Medicine | Admitting: Internal Medicine

## 2021-02-06 ENCOUNTER — Other Ambulatory Visit: Payer: Self-pay

## 2021-02-06 DIAGNOSIS — C169 Malignant neoplasm of stomach, unspecified: Secondary | ICD-10-CM | POA: Diagnosis present

## 2021-02-06 MED ORDER — IOHEXOL 300 MG/ML  SOLN
100.0000 mL | Freq: Once | INTRAMUSCULAR | Status: AC | PRN
  Administered 2021-02-06: 100 mL via INTRAVENOUS

## 2021-02-07 NOTE — Progress Notes (Signed)
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 65 y.o. 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  Today she report feeling fair. She underwent colonoscopy and endoscopy on 01/23/2021 as she reports indigestion and excessive saliva production upon eating starting one month ago, consequently her appetite and eating has decreased. She also reports loose stools. She reports only one episode of vomiting occurring last Thursday. She reports losing about 30 pounds over the past 6 months. She denies tingling/numbness. She denies history of CVA and MI.   She currently lives at home with her sister, and she is able to do all of her typical home activities unassisted. 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 She denies excessive smoking history. Her father died of colon cancer, her brother died of lung cancer, a paternal cousin had lung cancer, another paternal cousin had prostate cancer, a paternal aunt had pancreatic cancer, and a paternal uncle had lung cancer.   MEDICAL HISTORY:  Past Medical History:  Diagnosis Date   Allergy    Arthritis    Diabetes mellitus    Graves disease    with radiation, now on Synthroid   Graves disease    Hypertension     SURGICAL HISTORY: Past Surgical History:  Procedure Laterality Date   BIOPSY  01/23/2021   Procedure: BIOPSY;  Surgeon: Hannah Harman, DO;  [facility];  Service: Endoscopy;;    CHOLECYSTECTOMY     COLONOSCOPY  09/22/2007   SLF: moderate internal hemorrhoids, multiple 3-4 mm sessile polyps in splenic flexure, hyperplastic.    COLONOSCOPY  09/27/2002   Rehman:Small external hemorrhoids/Six tiny   polyps ablated by cold biopsy from sigmoid colon   COLONOSCOPY N/A 06/29/2013   Moderate sized internal hemorrhoids. Sigmoid diverticulosis. Normal TI. Banding X 3.   COLONOSCOPY WITH PROPOFOL N/A 01/23/2021   Procedure: COLONOSCOPY WITH PROPOFOL;  Surgeon: Hannah Harman, DO;  Location: [facility];  Service: Endoscopy;  Laterality: N/A;  8:00am   ESOPHAGOGASTRODUODENOSCOPY (EGD) WITH PROPOFOL N/A 01/23/2021   Procedure: ESOPHAGOGASTRODUODENOSCOPY (EGD) WITH PROPOFOL;  Surgeon: Hannah Harman, DO;  [facility];  Service: Endoscopy;  Laterality: N/A;   HEMORRHOID BANDING  06/29/2013   Procedure: HEMORRHOID BANDING;  Surgeon: Hannah Binder, MD;  Location: [facility];  Service: Endoscopy;;   TUBAL LIGATION      SOCIAL HISTORY: Social History   Socioeconomic History   Marital status: Legally Separated    Spouse name: Not on file   Number of children: Not on file   Years of education: Not on file   Highest education level: Not on file  Occupational History   Occupation: Self-employed    Comment: sitter  Tobacco Use   Smoking status: Never   Smokeless tobacco: Never  Vaping Use   Vaping Use: Never used  Substance and Sexual Activity   Alcohol use: No   Drug use: No   Sexual activity: Not on file  Other Topics Concern   Not on file  Social History Narrative   Not on file   Social Determinants of Health   Financial Resource Strain: Not on file  Food Insecurity: Not on file  Transportation Needs: Not on file  Physical Activity: Not on file  Stress: Not on file  Social Connections: Not on file  Intimate Partner Violence: Not on file    FAMILY HISTORY: Family History  Problem Relation Age of Onset   Colon cancer Father 16        deceased   Diabetes Mother    Heart disease Mother    Cancer - Lung Brother     ALLERGIES:  is allergic to ace inhibitors.  MEDICATIONS:  Current Outpatient Medications  Medication Sig Dispense Refill   acetaminophen (TYLENOL) 500 MG tablet Take 500-1,000 mg by mouth every 6 (six) hours as needed (for pain.).     amLODipine (NORVASC) 5 MG tablet Take 5 mg by mouth every evening.     atorvastatin (LIPITOR) 10 MG tablet Take 1 tablet (10 mg total) by mouth daily. (Patient taking differently: Take 10 mg by mouth at bedtime.) 90 tablet 3   levothyroxine (SYNTHROID) 112 MCG tablet TAKE 1 TABLET BY MOUTH EVERY MORNING BEFORE BREAKFAST 90 tablet 3   losartan (COZAAR) 100 MG tablet Take 1 tablet (100 mg total) by mouth daily. 90 tablet 3   metFORMIN (GLUCOPHAGE) 500 MG tablet Take 1 tablet (500 mg total) by mouth 2 (two) times daily with a meal. 120 tablet 5   No current facility-administered medications for this visit.    REVIEW OF SYSTEMS:   Review of Systems  Constitutional:  Positive for appetite change (25%), fatigue (40%) and unexpected weight change (-30 lbs).  Cardiovascular:  Positive for palpitations.  Gastrointestinal:  Positive for diarrhea, nausea and vomiting (x1).  Musculoskeletal:  Positive for neck pain.  Neurological:  Positive for headaches. Negative for numbness.  Psychiatric/Behavioral:  Positive for sleep disturbance.   All other systems reviewed and are negative.   PHYSICAL EXAMINATION: ECOG PERFORMANCE STATUS: 0 - Asymptomatic  Vitals:   02/08/21 0802  BP: 126/76  Pulse: 75  Resp: 18  Temp: 97.7 F (36.5 C)  SpO2: 100%   Filed Weights   02/08/21 0802  Weight: 186 lb 6.4 oz (84.6 kg)   Physical Exam Vitals reviewed.  Constitutional:      Appearance: Normal appearance.  Cardiovascular:     Rate and Rhythm: Normal rate and regular rhythm.     Pulses: Normal pulses.     Heart sounds: Normal heart sounds.  Pulmonary:     Effort: Pulmonary effort is  normal.     Breath sounds: Normal breath sounds.  Abdominal:     Palpations: Abdomen is soft. There is no hepatomegaly, splenomegaly or mass.     Tenderness: There is abdominal tenderness in the epigastric area.  Musculoskeletal:     Right lower leg: No edema.     Left lower leg: No edema.  Lymphadenopathy:     Cervical: No cervical adenopathy.     Right cervical: No superficial cervical adenopathy.    Left cervical: No superficial cervical adenopathy.  Neurological:     General: No focal deficit present.     Mental Status: She is alert and oriented to person, place, and time.  Psychiatric:        Mood and Affect: Mood normal.        Behavior: Behavior normal.     LABORATORY DATA:  I have reviewed the data as listed CBC  Latest Ref Rng & Units 12/18/2018  WBC - 6.7  Hemoglobin 12.0 - 16.0 11.9(A)  Hematocrit 36 - 46 37  Platelets 150 - 399 307   CMP Latest Ref Rng & Units 01/29/2021 06/23/2020 12/18/2018  Glucose 65 - 99 mg/dL - - -  BUN 4 - 21 - 6 1(A)  Creatinine 0.44 - 1.00 mg/dL 0.94 0.9 0.9  Sodium 137 - 147 - 141 141  Potassium 3.4 - 5.3 - 3.7 3.8  Chloride 99 - 108 - 103 104  CO2 13 - 22 - 21 23(A)  Calcium 8.7 - 10.7 - 9.7 9.8  Total Protein 6.0 - 8.5 g/dL - - -  Total Bilirubin 0.0 - 1.2 mg/dL - - -  Alkaline Phos 25 - 125 - 87 -  AST 13 - 35 - 11(A) 13  ALT 7 - 35 - 8 11    RADIOGRAPHIC STUDIES: I have personally reviewed the radiological images as listed and agreed with the findings in the report. CT CHEST ABDOMEN PELVIS W CONTRAST  Result Date: 02/06/2021 CLINICAL DATA:  Gastric carcinoma.  Staging. EXAM: CT CHEST, ABDOMEN, AND PELVIS WITH CONTRAST TECHNIQUE: Multidetector CT imaging of the chest, abdomen and pelvis was performed following the standard protocol during bolus administration of intravenous contrast. CONTRAST:  147m OMNIPAQUE IOHEXOL 300 MG/ML  SOLN COMPARISON:  Abdomen/pelvis CT 05/22/2018 FINDINGS: CT CHEST FINDINGS Cardiovascular: The heart  size is normal. No substantial pericardial effusion. Mediastinum/Nodes: No mediastinal lymphadenopathy. There is no hilar lymphadenopathy. The esophagus has normal imaging features. There is no axillary lymphadenopathy. Lungs/Pleura: No suspicious pulmonary nodule or mass. Atelectasis noted in both lung bases and nodular atelectasis noted medial right base on 104/6. Trace right pleural effusion evident Musculoskeletal: No worrisome lytic or sclerotic osseous abnormality. CT ABDOMEN PELVIS FINDINGS Hepatobiliary: 3.2 cm ill-defined lesion anterior right liver on 49/2 is new since prior study and highly concerning for metastatic disease. Gallbladder surgically absent. No intrahepatic or extrahepatic biliary dilation. Pancreas: No focal mass lesion. No dilatation of the main duct. No intraparenchymal cyst. No peripancreatic edema. Spleen: 9 mm hypodensity in the spleen is stable since prior consistent with benign process. Adrenals/Urinary Tract: No adrenal nodule or mass. Kidneys unremarkable. No evidence for hydroureter. The urinary bladder appears normal for the degree of distention. Stomach/Bowel: Gastric wall thickening noted mid and distal stomach. Duodenum is normally positioned as is the ligament of Treitz. No small bowel wall thickening. No small bowel dilatation. The terminal ileum is normal. The appendix is normal. No gross colonic mass. No colonic wall thickening. Vascular/Lymphatic: There is mild atherosclerotic calcification of the abdominal aorta without aneurysm. 8 mm short axis gastrohepatic ligament lymph node on 49/2 is mildly increased in the interval since prior study. 10 mm short axis hepatoduodenal ligament lymph node identified on 54/2. Small juxta diaphragmatic nodes are seen anteriorly on the right measuring up to 4 mm short axis on 39/2. Stranding and subtle ill-defined nodularity noted in the gastrocolic ligament (see axial 56/2 and sagittal images 88 and 98 of series 5) with tiny enhancing  soft tissue nodules, findings suspicious for metastatic spread. Reproductive: Large left-sided exophytic fibroid. There is no adnexal mass. Other: Small volume free fluid in the cul-de-sac. Musculoskeletal: No worrisome lytic or sclerotic osseous abnormality. IMPRESSION: 1. Gastric wall thickening noted in the mid and distal stomach in this patient with a reported history of gastric carcinoma. 2. 3.2 cm ill-defined lesion anterior right liver is new since prior study and highly concerning for metastatic disease. 3.  Stranding and subtle ill-defined nodularity in the gastrocolic ligament with associated tiny enhancing soft tissue nodules, findings suspicious for metastatic spread. 4. Upper normal lymph nodes in the gastrohepatic and hepatoduodenal ligaments with small right juxta diaphragmatic lymph nodes. Metastatic disease not excluded and close attention on follow-up recommended. 5. Trace right pleural effusion with bibasilar atelectasis. 6. Large left-sided exophytic fibroid. 7. Aortic Atherosclerosis (ICD10-I70.0). Electronically Signed   By: Misty Stanley M.D.   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 - 30 pound weight loss in the last 6 months. - EGD on 01/23/2021 shows mucosal changes in the gastric body, circumferential mass-stricture - Pathology consistent with poorly differentiated carcinoma with signet ring cells - CT CAP on 02/06/2021 shows gastric wall thickening noted in the mid and distal stomach.  3.2 cm ill-defined lesion in the anterior right liver.  Abnormal lymph nodes in the gastrohepatic and hepatoduodenal ligaments with small right checks to diaphragmatic lymph nodes.   Social/family history: - Her sister lives with her.  She is independent of ADLs and IADLs.  She worked as a Programmer, applications and retired in 2021.  Non-smoker. - Father had colon  cancer.  Brother had lung cancer.  One paternal cousin had lung cancer and another paternal cousin had prostate cancer.  Paternal uncle had lung cancer and paternal aunt had pancreatic cancer.   PLAN:  Stage IV (TxN1M1) poorly differentiated gastric carcinoma: - We have reviewed pathology reports and images with the patient in detail. - Recommend biopsy of the liver lesion. - Recommend NGS testing (HER2, PD-L1 and MSI) - Recommend genetic testing. - Schedule for port placement. - Recommended plan would be FOLFOX with or without Herceptin/nivolumab based on the NGS test results. - RTC 2 to 3 weeks for follow-up.  2.  Persistent nausea: - We will start her on Compazine every 6 hours as needed.   All questions were answered. The patient knows to call the clinic with any problems, questions or concerns.   Hannah Jack, MD, 02/08/21 8:37 AM  [place] [phone]   I, Thana Ates, am acting as a scribe for Dr. Derek Knight.  I, Hannah Jack MD, have reviewed the above documentation for accuracy and completeness, and I agree with the above.

## 2021-02-08 ENCOUNTER — Inpatient Hospital Stay (HOSPITAL_COMMUNITY): Payer: 59

## 2021-02-08 ENCOUNTER — Encounter (HOSPITAL_COMMUNITY): Payer: Self-pay

## 2021-02-08 ENCOUNTER — Inpatient Hospital Stay (HOSPITAL_COMMUNITY): Payer: 59 | Attending: Hematology | Admitting: Hematology

## 2021-02-08 ENCOUNTER — Other Ambulatory Visit: Payer: Self-pay

## 2021-02-08 ENCOUNTER — Encounter (HOSPITAL_COMMUNITY): Payer: Self-pay | Admitting: Hematology

## 2021-02-08 DIAGNOSIS — R110 Nausea: Secondary | ICD-10-CM | POA: Diagnosis not present

## 2021-02-08 DIAGNOSIS — R634 Abnormal weight loss: Secondary | ICD-10-CM | POA: Insufficient documentation

## 2021-02-08 DIAGNOSIS — C169 Malignant neoplasm of stomach, unspecified: Secondary | ICD-10-CM | POA: Diagnosis present

## 2021-02-08 DIAGNOSIS — Z79899 Other long term (current) drug therapy: Secondary | ICD-10-CM | POA: Diagnosis not present

## 2021-02-08 LAB — CBC WITH DIFFERENTIAL/PLATELET
Abs Immature Granulocytes: 0.02 10*3/uL (ref 0.00–0.07)
Basophils Absolute: 0 10*3/uL (ref 0.0–0.1)
Basophils Relative: 1 %
Eosinophils Absolute: 0 10*3/uL (ref 0.0–0.5)
Eosinophils Relative: 0 %
HCT: 34.8 % — ABNORMAL LOW (ref 36.0–46.0)
Hemoglobin: 11.2 g/dL — ABNORMAL LOW (ref 12.0–15.0)
Immature Granulocytes: 0 %
Lymphocytes Relative: 31 %
Lymphs Abs: 2.3 10*3/uL (ref 0.7–4.0)
MCH: 27 pg (ref 26.0–34.0)
MCHC: 32.2 g/dL (ref 30.0–36.0)
MCV: 83.9 fL (ref 80.0–100.0)
Monocytes Absolute: 0.5 10*3/uL (ref 0.1–1.0)
Monocytes Relative: 7 %
Neutro Abs: 4.6 10*3/uL (ref 1.7–7.7)
Neutrophils Relative %: 61 %
Platelets: 378 10*3/uL (ref 150–400)
RBC: 4.15 MIL/uL (ref 3.87–5.11)
RDW: 15.9 % — ABNORMAL HIGH (ref 11.5–15.5)
WBC: 7.5 10*3/uL (ref 4.0–10.5)
nRBC: 0 % (ref 0.0–0.2)

## 2021-02-08 LAB — COMPREHENSIVE METABOLIC PANEL
ALT: 7 U/L (ref 0–44)
AST: 13 U/L — ABNORMAL LOW (ref 15–41)
Albumin: 4.1 g/dL (ref 3.5–5.0)
Alkaline Phosphatase: 77 U/L (ref 38–126)
Anion gap: 10 (ref 5–15)
BUN: 10 mg/dL (ref 8–23)
CO2: 26 mmol/L (ref 22–32)
Calcium: 9.4 mg/dL (ref 8.9–10.3)
Chloride: 105 mmol/L (ref 98–111)
Creatinine, Ser: 1.14 mg/dL — ABNORMAL HIGH (ref 0.44–1.00)
GFR, Estimated: 54 mL/min — ABNORMAL LOW (ref >60)
Glucose, Bld: 110 mg/dL — ABNORMAL HIGH (ref 70–99)
Potassium: 3.3 mmol/L — ABNORMAL LOW (ref 3.5–5.1)
Sodium: 141 mmol/L (ref 135–145)
Total Bilirubin: 0.4 mg/dL (ref 0.3–1.2)
Total Protein: 7.8 g/dL (ref 6.5–8.1)

## 2021-02-08 LAB — GENETIC SCREENING ORDER

## 2021-02-08 MED ORDER — PROCHLORPERAZINE MALEATE 10 MG PO TABS: 10.0000 mg | ORAL_TABLET | Freq: Four times a day (QID) | ORAL | 0 refills | Status: DC | PRN

## 2021-02-08 NOTE — Progress Notes (Signed)
Hannah Knight  Female DOB  [birth year] SSN  [id number] Address  [place]  [place] [address] Phone  [phone] [facility])  [phone] (Mobile) *Preferred*    RE: CT Biopsy Received: Today Criselda Peaches, MD  Valli Glance; P Ir Procedure Requests Approved for US guided core biopsy of liver mass.   HKM        Previous Messages   ----- Message -----  From: Valli Glance  Sent: 02/08/2021  10:14 AM EST  To: Ir Procedure Requests  Subject: CT Biopsy                                       CT Biopsy        Reason: Malignant neoplasm of stomach, unspecified location, Gastric cancer, liver lesions - please biopsy liver lesion       History: CT in computer       [place], March [facility]

## 2021-02-08 NOTE — Progress Notes (Signed)
[facility] testing requested on [id number] per Dr. Delton Coombes.

## 2021-02-08 NOTE — Progress Notes (Signed)
I met with the patient today during and after her visit with Dr. Delton Coombes. I provided my contact information and encouraged the patient to call with questions or concerns.

## 2021-02-08 NOTE — Patient Instructions (Addendum)
[place] at [facility] Discharge Instructions  You were seen and examined today by Dr. Delton Coombes. Dr. Delton Coombes is a medical oncologist, meaning that he specializes in the management of cancer diagnoses with medications. Dr. Delton Coombes discussed your past medical history, family history of cancers, and the events that led to you being here today.  Your recent endoscopy revealed a stomach cancer. Dr. Abbey Chatters then ordered a CT scan that revealed areas of concern for your liver in addition to lymph nodes, this is concerning for spread of the cancer. Dr. Delton Coombes has recommended a biopsy of your liver to confirm if this is related to your stomach cancer.  Dr. Delton Coombes has also recommended genetic testing because gastric cancer is not terribly common in the [place], so it is important to identify if there is any genetic predisposition placing you at higher risk for cancer development.  Despite the results of the liver biopsy, you will need chemotherapy. The safest way to administer chemotherapy is through a Port-A-Cath.  Dr. Delton Coombes will see you again following the liver biopsy.     Thank you for choosing [place] at [facility] to provide your oncology and hematology care.  To afford each patient quality time with our provider, please arrive at least 15 minutes before your scheduled appointment time.   If you have a lab appointment with the [facility] please come in thru the Main Entrance and check in at the main information desk.  You need to re-schedule your appointment should you arrive 10 or more minutes late.  We strive to give you quality time with our providers, and arriving late affects you and other patients whose appointments are after yours.  Also, if you no show three or more times for appointments you may be dismissed from the clinic at the providers discretion.     Again, thank you for choosing [facility].  Our hope is that these requests will decrease the amount of time that you wait before being seen by our physicians.       _____________________________________________________________  Should you have questions after your visit to [facility], please contact our office at [phone] and follow the prompts.  Our office hours are 8:00 a.m. and 4:30 p.m. Monday - Friday.  Please note that voicemails left after 4:00 p.m. may not be returned until the following business day.  We are closed weekends and major holidays.  You do have access to a nurse 24-7, just call the main number to the clinic [phone] and do not press any options, hold on the line and a nurse will answer the phone.    For prescription refill requests, have your pharmacy contact our office and allow 72 hours.    Due to Covid, you will need to wear a mask upon entering the hospital. If you do not have a mask, a mask will be given to you at the Main Entrance upon arrival. For doctor visits, patients may have 1 support person age 80 or older with them. For treatment visits, patients can not have anyone with them due to social distancing guidelines and our immunocompromised population.

## 2021-02-09 LAB — CEA: CEA: 1.9 ng/mL (ref 0.0–4.7)

## 2021-02-20 ENCOUNTER — Other Ambulatory Visit: Payer: Self-pay | Admitting: Student

## 2021-02-21 ENCOUNTER — Other Ambulatory Visit: Payer: Self-pay

## 2021-02-21 ENCOUNTER — Ambulatory Visit (HOSPITAL_COMMUNITY)
Admission: RE | Admit: 2021-02-21 | Discharge: 2021-02-21 | Disposition: A | Discharge status: Home / Self Care | Payer: 59 | Source: Ambulatory Visit | Attending: Hematology | Admitting: Hematology

## 2021-02-21 DIAGNOSIS — I10 Essential (primary) hypertension: Secondary | ICD-10-CM | POA: Diagnosis not present

## 2021-02-21 DIAGNOSIS — R160 Hepatomegaly, not elsewhere classified: Secondary | ICD-10-CM | POA: Insufficient documentation

## 2021-02-21 DIAGNOSIS — E0500 Thyrotoxicosis with diffuse goiter without thyrotoxic crisis or storm: Secondary | ICD-10-CM | POA: Insufficient documentation

## 2021-02-21 DIAGNOSIS — E119 Type 2 diabetes mellitus without complications: Secondary | ICD-10-CM | POA: Diagnosis not present

## 2021-02-21 DIAGNOSIS — C169 Malignant neoplasm of stomach, unspecified: Secondary | ICD-10-CM | POA: Diagnosis not present

## 2021-02-21 HISTORY — PX: IR US GUIDE BX ASP/DRAIN: IMG2392

## 2021-02-21 HISTORY — PX: IR IMAGING GUIDED PORT INSERTION: IMG5740

## 2021-02-21 LAB — PROTIME-INR
INR: 1 (ref 0.8–1.2)
Prothrombin Time: 13.2 seconds (ref 11.4–15.2)

## 2021-02-21 LAB — CBC
HCT: 34.2 % — ABNORMAL LOW (ref 36.0–46.0)
Hemoglobin: 10.8 g/dL — ABNORMAL LOW (ref 12.0–15.0)
MCH: 26.3 pg (ref 26.0–34.0)
MCHC: 31.6 g/dL (ref 30.0–36.0)
MCV: 83.2 fL (ref 80.0–100.0)
Platelets: 351 10*3/uL (ref 150–400)
RBC: 4.11 MIL/uL (ref 3.87–5.11)
RDW: 15.6 % — ABNORMAL HIGH (ref 11.5–15.5)
WBC: 8.2 10*3/uL (ref 4.0–10.5)
nRBC: 0 % (ref 0.0–0.2)

## 2021-02-21 LAB — GLUCOSE, CAPILLARY
Glucose-Capillary: 96 mg/dL (ref 70–99)
Glucose-Capillary: 96 mg/dL (ref 70–99)

## 2021-02-21 MED ORDER — LIDOCAINE-EPINEPHRINE 1 %-1:100000 IJ SOLN
INTRAMUSCULAR | Status: AC
  Filled 2021-02-21: qty 1

## 2021-02-21 MED ORDER — LIDOCAINE-EPINEPHRINE 2 %-1:100000 IJ SOLN
INTRAMUSCULAR | Status: DC | PRN
  Administered 2021-02-21: 20 mL

## 2021-02-21 MED ORDER — FENTANYL CITRATE (PF) 100 MCG/2ML IJ SOLN
INTRAMUSCULAR | Status: AC
  Filled 2021-02-21: qty 2

## 2021-02-21 MED ORDER — FENTANYL CITRATE (PF) 100 MCG/2ML IJ SOLN
INTRAMUSCULAR | Status: AC | PRN
  Administered 2021-02-21 (×2): 50 ug via INTRAVENOUS

## 2021-02-21 MED ORDER — GELATIN ABSORBABLE 12-7 MM EX MISC
CUTANEOUS | Status: AC | PRN
  Administered 2021-02-21: 1

## 2021-02-21 MED ORDER — MIDAZOLAM HCL 2 MG/2ML IJ SOLN
INTRAMUSCULAR | Status: AC
  Filled 2021-02-21: qty 2

## 2021-02-21 MED ORDER — GELATIN ABSORBABLE 12-7 MM EX MISC
CUTANEOUS | Status: AC
  Filled 2021-02-21: qty 1

## 2021-02-21 MED ORDER — MIDAZOLAM HCL 2 MG/2ML IJ SOLN
INTRAMUSCULAR | Status: AC | PRN
  Administered 2021-02-21 (×2): 1 mg via INTRAVENOUS

## 2021-02-21 MED ORDER — LIDOCAINE-EPINEPHRINE 2 %-1:100000 IJ SOLN
INTRAMUSCULAR | Status: AC | PRN
  Administered 2021-02-21: 20 mL

## 2021-02-21 NOTE — Procedures (Signed)
Interventional Radiology Procedure Note  Procedure:  1) Image guided port placement 2) Ultrasound guided liver mass biopsy  Findings: Please refer to procedural dictation for full description. Right IJ single lumen port placed.  Right anterior hepatic mass 18 ga core bx x3, placed in formalin.  Gelfoam needle track embolization.  Complications: None immediate  Estimated Blood Loss: < 5 mL  Recommendations: Strict 3 hour bedrest. Follow up Pathology results. Port ready for immediate use.   Ruthann Cancer, MD Pager: [phone]

## 2021-02-21 NOTE — H&P (Signed)
Chief Complaint: Gastric carcinoma. Team is requesting liver biopsy for further evaluation of metastases and portacath for chemotherapy access.  Referring Physician(s): Katragadda,Sreedhar  Supervising Physician: Ruthann Cancer  Patient Status: [facility] - Out-pt  History of Present Illness: Hannah Knight is a 65 y.o. female 65 y.o. female outpatient. History of  DM, Graves disease, HTN. Poorly differentiated gastric carcinoma. Team is requesting liver biopsy for further evaluation. CT CAP from 12.6.22 reads 3.2 cm ill-defined lesion anterior right liver is new since prior study and highly concerning for metastatic disease. Team is requesting liver biopsy for further evaluation of possible metastases.   Currently without any significant complaints. Patient alert and laying in bed, calm and comfortable. Denies any fevers, headache, chest pain, SOB, cough, abdominal pain, nausea, vomiting or bleeding. Return precautions and treatment recommendations and follow-up discussed with the patient  who is agreeable with the plan.    Past Medical History:  Diagnosis Date   Allergy    Arthritis    Diabetes mellitus    Graves disease    with radiation, now on Synthroid   Graves disease    Hypertension     Past Surgical History:  Procedure Laterality Date   BIOPSY  01/23/2021   Procedure: BIOPSY;  Surgeon: [name], DO;  Location: [facility];  Service: Endoscopy;;   CHOLECYSTECTOMY     COLONOSCOPY  09/22/2007   SLF: moderate internal hemorrhoids, multiple 3-4 mm sessile polyps in splenic flexure, hyperplastic.    COLONOSCOPY  09/27/2002   Rehman:Small external hemorrhoids/Six tiny   polyps ablated by cold biopsy from sigmoid colon   COLONOSCOPY N/A 06/29/2013   Moderate sized internal hemorrhoids. Sigmoid diverticulosis. Normal TI. Banding X 3.   COLONOSCOPY WITH PROPOFOL N/A 01/23/2021   Procedure: COLONOSCOPY WITH PROPOFOL;  Surgeon: [name], DO;  Location:  [facility];  Service: Endoscopy;  Laterality: N/A;  8:00am   ESOPHAGOGASTRODUODENOSCOPY (EGD) WITH PROPOFOL N/A 01/23/2021   Procedure: ESOPHAGOGASTRODUODENOSCOPY (EGD) WITH PROPOFOL;  Surgeon: [name], DO;  Location: [facility];  Service: Endoscopy;  Laterality: N/A;   HEMORRHOID BANDING  06/29/2013   Procedure: HEMORRHOID BANDING;  Surgeon: Danie Binder, MD;  Location: [facility];  Service: Endoscopy;;   TUBAL LIGATION      Allergies: Ace inhibitors  Medications: Prior to Admission medications   Medication Sig Start Date End Date Taking? Authorizing Provider  acetaminophen (TYLENOL) 650 MG CR tablet Take 1,300 mg by mouth every 8 (eight) hours as needed for pain.   Yes [provider]  amLODipine (NORVASC) 5 MG tablet Take 5 mg by mouth every evening. 11/13/20  Yes [provider]  atorvastatin (LIPITOR) 10 MG tablet Take 1 tablet (10 mg total) by mouth daily. Patient taking differently: Take 10 mg by mouth at bedtime. 02/22/19  Yes Corum, Rex Kras, MD  levothyroxine (SYNTHROID) 112 MCG tablet TAKE 1 TABLET BY MOUTH EVERY MORNING BEFORE BREAKFAST 12/27/19  Yes Brita Romp, NP  losartan (COZAAR) 100 MG tablet Take 1 tablet (100 mg total) by mouth daily. 02/22/19  Yes Corum, Rex Kras, MD  metFORMIN (GLUCOPHAGE) 500 MG tablet Take 1 tablet (500 mg total) by mouth 2 (two) times daily with a meal. 02/22/19  Yes Corum, Rex Kras, MD  prochlorperazine (COMPAZINE) 10 MG tablet Take 1 tablet (10 mg total) by mouth every 6 (six) hours as needed for nausea or vomiting. 02/08/21  Yes Derek Jack, MD     Family History  Problem  Relation Age of Onset   Colon cancer Father 52       deceased   Diabetes Mother    Heart disease Mother    Cancer - Lung Brother     Social History   Socioeconomic History   Marital status: Legally Separated    Spouse name: Not on file   Number of children: Not on file   Years of education: Not on file   Highest  education level: Not on file  Occupational History   Occupation: Self-employed    Comment: sitter  Tobacco Use   Smoking status: Never   Smokeless tobacco: Never  Vaping Use   Vaping Use: Never used  Substance and Sexual Activity   Alcohol use: No   Drug use: No   Sexual activity: Not on file  Other Topics Concern   Not on file  Social History Narrative   Not on file   Social Determinants of Health   Financial Resource Strain: Not on file  Food Insecurity: Not on file  Transportation Needs: Not on file  Physical Activity: Not on file  Stress: Not on file  Social Connections: Not on file    Review of Systems: A 12 point ROS discussed and pertinent positives are indicated in the HPI above.  All other systems are negative.  Review of Systems  Constitutional:  Negative for fatigue and fever.  HENT:  Negative for congestion.   Respiratory:  Negative for cough and shortness of breath.   Gastrointestinal:  Negative for abdominal pain, diarrhea, nausea and vomiting.   Vital Signs: BP (!) 143/79    Pulse 78    Temp 98.7 F (37.1 C) (Oral)    Resp 15    Ht 5\' 6"  (1.676 m)    Wt 184 lb (83.5 kg)    SpO2 96%    BMI 29.70 kg/m   Physical Exam Vitals and nursing note reviewed.  Constitutional:      Appearance: She is well-developed.  HENT:     Head: Normocephalic and atraumatic.  Eyes:     Conjunctiva/sclera: Conjunctivae normal.  Cardiovascular:     Rate and Rhythm: Normal rate and regular rhythm.     Heart sounds: Normal heart sounds.  Pulmonary:     Effort: Pulmonary effort is normal.     Breath sounds: Normal breath sounds.  Musculoskeletal:        General: Normal range of motion.     Cervical back: Normal range of motion.  Skin:    General: Skin is warm.  Neurological:     Mental Status: She is alert and oriented to person, place, and time.    Imaging: CT CHEST ABDOMEN PELVIS W CONTRAST  Result Date: 02/06/2021 CLINICAL DATA:  Gastric carcinoma.  Staging.  EXAM: CT CHEST, ABDOMEN, AND PELVIS WITH CONTRAST TECHNIQUE: Multidetector CT imaging of the chest, abdomen and pelvis was performed following the standard protocol during bolus administration of intravenous contrast. CONTRAST:  154mL OMNIPAQUE IOHEXOL 300 MG/ML  SOLN COMPARISON:  Abdomen/pelvis CT 05/22/2018 FINDINGS: CT CHEST FINDINGS Cardiovascular: The heart size is normal. No substantial pericardial effusion. Mediastinum/Nodes: No mediastinal lymphadenopathy. There is no hilar lymphadenopathy. The esophagus has normal imaging features. There is no axillary lymphadenopathy. Lungs/Pleura: No suspicious pulmonary nodule or mass. Atelectasis noted in both lung bases and nodular atelectasis noted medial right base on 104/6. Trace right pleural effusion evident Musculoskeletal: No worrisome lytic or sclerotic osseous abnormality. CT ABDOMEN PELVIS FINDINGS Hepatobiliary: 3.2 cm ill-defined lesion anterior right liver  on 49/2 is new since prior study and highly concerning for metastatic disease. Gallbladder surgically absent. No intrahepatic or extrahepatic biliary dilation. Pancreas: No focal mass lesion. No dilatation of the main duct. No intraparenchymal cyst. No peripancreatic edema. Spleen: 9 mm hypodensity in the spleen is stable since prior consistent with benign process. Adrenals/Urinary Tract: No adrenal nodule or mass. Kidneys unremarkable. No evidence for hydroureter. The urinary bladder appears normal for the degree of distention. Stomach/Bowel: Gastric wall thickening noted mid and distal stomach. Duodenum is normally positioned as is the ligament of Treitz. No small bowel wall thickening. No small bowel dilatation. The terminal ileum is normal. The appendix is normal. No gross colonic mass. No colonic wall thickening. Vascular/Lymphatic: There is mild atherosclerotic calcification of the abdominal aorta without aneurysm. 8 mm short axis gastrohepatic ligament lymph node on 49/2 is mildly increased in the  interval since prior study. 10 mm short axis hepatoduodenal ligament lymph node identified on 54/2. Small juxta diaphragmatic nodes are seen anteriorly on the right measuring up to 4 mm short axis on 39/2. Stranding and subtle ill-defined nodularity noted in the gastrocolic ligament (see axial 56/2 and sagittal images 88 and 98 of series 5) with tiny enhancing soft tissue nodules, findings suspicious for metastatic spread. Reproductive: Large left-sided exophytic fibroid. There is no adnexal mass. Other: Small volume free fluid in the cul-de-sac. Musculoskeletal: No worrisome lytic or sclerotic osseous abnormality. IMPRESSION: 1. Gastric wall thickening noted in the mid and distal stomach in this patient with a reported history of gastric carcinoma. 2. 3.2 cm ill-defined lesion anterior right liver is new since prior study and highly concerning for metastatic disease. 3. Stranding and subtle ill-defined nodularity in the gastrocolic ligament with associated tiny enhancing soft tissue nodules, findings suspicious for metastatic spread. 4. Upper normal lymph nodes in the gastrohepatic and hepatoduodenal ligaments with small right juxta diaphragmatic lymph nodes. Metastatic disease not excluded and close attention on follow-up recommended. 5. Trace right pleural effusion with bibasilar atelectasis. 6. Large left-sided exophytic fibroid. 7. Aortic Atherosclerosis (ICD10-I70.0). Electronically Signed   By: Misty Stanley M.D.   On: 02/06/2021 14:44    Labs:  CBC: Recent Labs    02/08/21 0846 02/21/21 1111  WBC 7.5 8.2  HGB 11.2* 10.8*  HCT 34.8* 34.2*  PLT 378 351    COAGS: Recent Labs    02/21/21 1111  INR 1.0    BMP: Recent Labs    06/23/20 0000 01/29/21 1127 02/08/21 0846  NA 141  --  141  K 3.7  --  3.3*  CL 103  --  105  CO2 21  --  26  GLUCOSE  --   --  110*  BUN 6  --  10  CALCIUM 9.7  --  9.4  CREATININE 0.9 0.94 1.14*  GFRNONAA  --  >60 54*    LIVER FUNCTION TESTS: Recent  Labs    06/23/20 0000 02/08/21 0846  BILITOT  --  0.4  AST 11* 13*  ALT 8 7  ALKPHOS 87 77  PROT  --  7.8  ALBUMIN 4.5 4.1     Assessment and Plan:  65 y.o. female outpatient. 65 y.o. female outpatient. History of  DM, Graves disease, HTN. Poorly differentiated gastric carcinoma. Team is requesting liver biopsy for further evaluation. CT CAP from 12.6.22 reads 3.2 cm ill-defined lesion anterior right liver is new since prior study and highly concerning for metastatic disease. Team is requesting liver biopsy for further evaluation of possible  metastases and portacath for chemotherapy access.  All labs and medications are within acceptable parameters. No pertinent allergies. Patient has been NPO since midnight.   Risks and benefits of liver biopsy was discussed with the patient and/or patient's family including, but not limited to bleeding, infection, damage to adjacent structures or low yield requiring additional tests.  Risks and benefits of image guided port-a-catheter placement was discussed with the patient including, but not limited to bleeding, infection, pneumothorax, or fibrin sheath development and need for additional procedures.  All of the questions were answered and there is agreement to proceed.  Consent signed and in chart.   Thank you for this interesting consult.  I greatly enjoyed meeting Arrin Ishler Takaki and look forward to participating in their care.  A copy of this report was sent to the requesting provider on this date.  Electronically Signed: Jacqualine Mau, NP 02/21/2021, 1:13 PM   I spent a total of  30 Minutes   in face to face in clinical consultation, greater than 50% of which was counseling/coordinating care for liver biopsy

## 2021-02-22 ENCOUNTER — Ambulatory Visit (HOSPITAL_COMMUNITY): Payer: 59 | Admitting: Hematology

## 2021-02-22 LAB — SURGICAL PATHOLOGY

## 2021-02-23 ENCOUNTER — Other Ambulatory Visit (HOSPITAL_COMMUNITY): Payer: 59

## 2021-02-23 ENCOUNTER — Ambulatory Visit (HOSPITAL_COMMUNITY): Payer: 59

## 2021-03-01 ENCOUNTER — Telehealth (HOSPITAL_COMMUNITY): Payer: Self-pay | Admitting: Hematology

## 2021-03-01 ENCOUNTER — Ambulatory Visit (HOSPITAL_COMMUNITY): Payer: 59 | Admitting: Hematology

## 2021-03-01 NOTE — Telephone Encounter (Signed)
PER JEN/[facility] PTS GENETIC TEST (81479) WAS DENIED STATING IT WAS NOT NM. THERE MAYBE AND OPTION FOR A P2P OR AN APPEAL. SHE WILL FAX OVER THE DENIAL LTR TO MY ATTEN FOR REVIEW

## 2021-03-07 NOTE — Progress Notes (Signed)
Hannah Knight, [place] [id number]   CLINIC:  Medical Oncology/Hematology  PCP:  Celene Squibb, MD [address] 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, a 66 y.o. 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 Hannah Knight was last seen on 02/08/2021.   Today she reports feeling fair. Her appetite is poor, and she is having difficulty swallowing. When she tries to swallow she feels phlegm in her throat and has to spit the food out; this started 2-3 weeks ago and occurs throughout the day. This sensation is improved when laying down. She has lost 16 lbs in the past month. She has been trying to drink about 1/2 to 1 can of Boost daily but occasionally will vomit after drinking. She is taking Compazine as needed for nausea. She denies any current pains. She denies tingling/numbness. She spends most of the day sitting in a recliner.   REVIEW OF SYSTEMS:  Review of Systems  Constitutional:  Positive for appetite change, fatigue and unexpected weight change (+16 lbs).  HENT:   Positive for trouble swallowing.   Respiratory:  Positive for cough and shortness of breath.   Gastrointestinal:  Positive for nausea and vomiting.  Neurological:  Positive for dizziness. Negative for numbness.   Psychiatric/Behavioral:  Positive for depression. The patient is nervous/anxious.   All other systems reviewed and are negative.  PAST MEDICAL/SURGICAL HISTORY:  Past Medical History:  Diagnosis Date   Allergy    Arthritis    Diabetes mellitus    Graves disease    with radiation, now on Synthroid   Graves disease    Hypertension    Past Surgical History:  Procedure Laterality Date   BIOPSY  01/23/2021   Procedure: BIOPSY;  Surgeon: [name], DO;  [facility];  Service: Endoscopy;;   CHOLECYSTECTOMY     COLONOSCOPY  09/22/2007   SLF: moderate internal hemorrhoids, multiple 3-4 mm sessile polyps in splenic flexure, hyperplastic.    COLONOSCOPY  09/27/2002   [facility]:Small external hemorrhoids/Six tiny   polyps ablated by cold biopsy from sigmoid colon   COLONOSCOPY N/A 06/29/2013   Moderate sized internal hemorrhoids. Sigmoid diverticulosis. Normal TI. Banding X 3.   COLONOSCOPY WITH PROPOFOL N/A 01/23/2021   Procedure: COLONOSCOPY WITH PROPOFOL;  Surgeon: [name], DO;  [facility];  Service: Endoscopy;  Laterality: N/A;  8:00am   ESOPHAGOGASTRODUODENOSCOPY (EGD) WITH PROPOFOL N/A 01/23/2021   Procedure: ESOPHAGOGASTRODUODENOSCOPY (EGD) WITH PROPOFOL;  Surgeon: [name], DO;  [facility];  Service: Endoscopy;  Laterality: N/A;   HEMORRHOID BANDING  06/29/2013   Procedure: HEMORRHOID BANDING;  Surgeon: Danie Binder, MD;  [facility];  Service: Endoscopy;;   IR IMAGING GUIDED PORT INSERTION  02/21/2021   IR US  GUIDE BX ASP/DRAIN  02/21/2021   TUBAL LIGATION      SOCIAL HISTORY:  Social History   Socioeconomic History   Marital status: Legally Separated    Spouse name: Not on file   Number of children: Not on file   Years of education: Not on file   Highest education level: Not on file  Occupational History   Occupation: Self-employed    Comment: sitter  Tobacco Use   Smoking status: Never    Smokeless tobacco: Never  Vaping Use   Vaping Use: Never used  Substance and Sexual Activity   Alcohol use: No   Drug use: No   Sexual activity: Not on file  Other Topics Concern   Not on file  Social History Narrative   Not on file   Social Determinants of Health   Financial Resource Strain: Not on file  Food Insecurity: Not on file  Transportation Needs: Not on file  Physical Activity: Not on file  Stress: Not on file  Social Connections: Not on file  Intimate Partner Violence: Not on file    FAMILY HISTORY:  Family History  Problem Relation Age of Onset   Colon cancer Father 75       deceased   Diabetes Mother    Heart disease Mother    Cancer - Lung Brother     CURRENT MEDICATIONS:  Current Outpatient Medications  Medication Sig Dispense Refill   amLODipine (NORVASC) 5 MG tablet Take 5 mg by mouth every evening.     atorvastatin (LIPITOR) 10 MG tablet Take 1 tablet (10 mg total) by mouth daily. (Patient taking differently: Take 10 mg by mouth at bedtime.) 90 tablet 3   levothyroxine (SYNTHROID) 112 MCG tablet TAKE 1 TABLET BY MOUTH EVERY MORNING BEFORE BREAKFAST 90 tablet 3   losartan (COZAAR) 100 MG tablet Take 1 tablet (100 mg total) by mouth daily. 90 tablet 3   metFORMIN (GLUCOPHAGE) 500 MG tablet Take 1 tablet (500 mg total) by mouth 2 (two) times daily with a meal. 120 tablet 5   scopolamine (TRANSDERM-SCOP) 1 MG/3DAYS Place 1 patch (1.5 mg total) onto the skin every 3 (three) days. 10 patch 12   acetaminophen (TYLENOL) 650 MG CR tablet Take 1,300 mg by mouth every 8 (eight) hours as needed for pain. (Patient not taking: Reported on 03/08/2021)     prochlorperazine (COMPAZINE) 10 MG tablet Take 1 tablet (10 mg total) by mouth every 6 (six) hours as needed for nausea or vomiting. (Patient not taking: Reported on 03/08/2021) 45 tablet 0   No current facility-administered medications for this visit.    ALLERGIES:  Allergies  Allergen Reactions   Ace Inhibitors  Anaphylaxis and Swelling    PHYSICAL EXAM:  Performance status (ECOG): 0 - Asymptomatic  Vitals:   03/08/21 1018  BP: 125/87  Pulse: 88  Resp: 18  Temp: 97.6 F (36.4 C)  SpO2: 98%   Wt Readings from Last 3 Encounters:  03/08/21 170 lb 6.4 oz (77.3 kg)  02/21/21 184 lb (83.5 kg)  02/08/21 186 lb 6.4 oz (84.6 kg)   Physical Exam Vitals reviewed.  Constitutional:      Appearance: Normal appearance.  HENT:     Mouth/Throat:     Pharynx: Oropharynx is clear.     Comments: No thrush present Cardiovascular:     Rate and Rhythm: Normal rate and regular rhythm.     Pulses: Normal pulses.     Heart sounds: Normal heart sounds.  Pulmonary:  Effort: Pulmonary effort is normal.     Breath sounds: Normal breath sounds.  Neurological:     General: No focal deficit present.     Mental Status: She is alert and oriented to person, place, and time.  Psychiatric:        Mood and Affect: Mood normal.        Behavior: Behavior normal.     LABORATORY DATA:  I have reviewed the labs as listed.  CBC Latest Ref Rng & Units 02/21/2021 02/08/2021 12/18/2018  WBC 4.0 - 10.5 K/uL 8.2 7.5 6.7  Hemoglobin 12.0 - 15.0 g/dL 10.8(L) 11.2(L) 11.9(A)  Hematocrit 36.0 - 46.0 % 34.2(L) 34.8(L) 37  Platelets 150 - 400 K/uL 351 378 307   CMP Latest Ref Rng & Units 02/08/2021 01/29/2021 06/23/2020  Glucose 70 - 99 mg/dL 110(H) - -  BUN 8 - 23 mg/dL 10 - 6  Creatinine 0.44 - 1.00 mg/dL 1.14(H) 0.94 0.9  Sodium 135 - 145 mmol/L 141 - 141  Potassium 3.5 - 5.1 mmol/L 3.3(L) - 3.7  Chloride 98 - 111 mmol/L 105 - 103  CO2 22 - 32 mmol/L 26 - 21  Calcium 8.9 - 10.3 mg/dL 9.4 - 9.7  Total Protein 6.5 - 8.1 g/dL 7.8 - -  Total Bilirubin 0.3 - 1.2 mg/dL 0.4 - -  Alkaline Phos 38 - 126 U/L 77 - 87  AST 15 - 41 U/L 13(L) - 11(A)  ALT 0 - 44 U/L 7 - 8    DIAGNOSTIC IMAGING:  I have independently reviewed the scans and discussed with the patient. IR US Guide Bx Asp/Drain  Result Date:  02/21/2021 INDICATION: 66 year old female with history of indeterminate liver mass in the setting of poorly differentiated gastric adenocarcinoma. EXAM: ULTRASOUND GUIDED LIVER LESION BIOPSY COMPARISON:  None. MEDICATIONS: None ANESTHESIA/SEDATION: Fentanyl 50 mcg IV; Versed 1 mg IV Total Moderate Sedation time:  12 minutes. The patient's level of consciousness and vital signs were monitored continuously by radiology nursing throughout the procedure under my direct supervision. COMPLICATIONS: None immediate. PROCEDURE: Informed written consent was obtained from the patient after a discussion of the risks, benefits and alternatives to treatment. The patient understands and consents the procedure. A timeout was performed prior to the initiation of the procedure. Ultrasound scanning was performed of the right upper abdominal quadrant demonstrates subhepatic, hypoechoic, right anterior liver mass measuring approximately 3.8 by 2.1 cm. The right lobe liver mass was selected for biopsy and the procedure was planned. The right upper abdominal quadrant was prepped and draped in the usual sterile fashion. The overlying soft tissues were anesthetized with 1% lidocaine with epinephrine. A 17 gauge, 6.8 cm co-axial needle was advanced into a peripheral aspect of the lesion. This was followed by total of 3 core biopsies with an 18 gauge core device under direct ultrasound guidance. The coaxial needle tract was embolized with a small amount of Gel-Foam slurry and superficial hemostasis was obtained with manual compression. Post procedural scanning was negative for definitive area of hemorrhage or additional complication. A dressing was placed. The patient tolerated the procedure well without immediate post procedural complication. IMPRESSION: Technically successful ultrasound guided core needle biopsy of right anterior liver mass. Ruthann Cancer, MD [facility] Radiology  Electronically Signed   By: Ruthann Cancer M.D.   On: 02/21/2021 16:29   IR IMAGING GUIDED PORT INSERTION  Result Date: 02/21/2021 INDICATION: 66 year old female with history of gastric cancer requiring central venous access for chemotherapy administration. EXAM:  IMPLANTED PORT A CATH PLACEMENT WITH ULTRASOUND AND FLUOROSCOPIC GUIDANCE COMPARISON:  None. MEDICATIONS: None. ANESTHESIA/SEDATION: Moderate (conscious) sedation was employed during this procedure. A total of Versed 1 mg and Fentanyl 50 mcg was administered intravenously. Moderate Sedation Time: 17 minutes. The patient's level of consciousness and vital signs were monitored continuously by radiology nursing throughout the procedure under my direct supervision. CONTRAST:  None FLUOROSCOPY TIME:  0 minutes, 24 seconds (1 mGy) COMPLICATIONS: None immediate. PROCEDURE: The procedure, risks, benefits, and alternatives were explained to the patient. Questions regarding the procedure were encouraged and answered. The patient understands and consents to the procedure. The right neck and chest were prepped with chlorhexidine in a sterile fashion, and a sterile drape was applied covering the operative field. Maximum barrier sterile technique with sterile gowns and gloves were used for the procedure. A timeout was performed prior to the initiation of the procedure. Ultrasound was used to examine the jugular vein which was compressible and free of internal echoes. A skin marker was used to demarcate the planned venotomy and port pocket incision sites. Local anesthesia was provided to these sites and the subcutaneous tunnel track with 1% lidocaine with 1:100,000 epinephrine. A small incision was created at the jugular access site and blunt dissection was performed of the subcutaneous tissues. Under ultrasound guidance, the jugular vein was accessed with a 21 ga micropuncture needle and an 0.018" wire was inserted to the superior vena cava. Real-time ultrasound  guidance was utilized for vascular access including the acquisition of a permanent ultrasound image documenting patency of the accessed vessel. A 5 Fr micopuncture set was then used, through which a 0.035" Rosen wire was passed under fluoroscopic guidance into the inferior vena cava. An 8 Fr dilator was then placed over the wire. A subcutaneous port pocket was then created along the upper chest wall utilizing a combination of sharp and blunt dissection. The pocket was irrigated with sterile saline, packed with gauze, and observed for hemorrhage. A single lumen "ISP" sized power injectable port was chosen for placement. The 8 Fr catheter was tunneled from the port pocket site to the venotomy incision. The port was placed in the pocket. The external catheter was trimmed to appropriate length. The dilator was exchanged for an 8 Fr peel-away sheath under fluoroscopic guidance. The catheter was then placed through the sheath and the sheath was removed. Final catheter positioning was confirmed and documented with a fluoroscopic spot radiograph. The port was accessed with a Huber needle, aspirated, and flushed with heparinized saline. The deep dermal layer of the port pocket incision was closed with interrupted 2-0 Vicryl suture. Dermabond was then placed over the port pocket and neck incisions. The patient tolerated the procedure well without immediate post procedural complication. FINDINGS: After catheter placement, the tip lies within the superior cavoatrial junction. The catheter aspirates and flushes normally and is ready for immediate use. IMPRESSION: Successful placement of a power injectable Port-A-Cath via the right internal jugular vein. The catheter is ready for immediate use. Ruthann Cancer, MD [facility] Radiology Electronically Signed   By: Ruthann Cancer M.D.   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 - 30 pound weight loss in the last 6 months. - EGD on 01/23/2021 shows mucosal changes in the gastric body, circumferential mass-stricture - Pathology consistent with poorly differentiated carcinoma with signet  ring cells - CT CAP on 02/06/2021 shows gastric wall thickening noted in the mid and distal stomach.  3.2 cm ill-defined lesion in the anterior right liver.  Abnormal lymph nodes in the gastrohepatic and hepatoduodenal ligaments with small right checks to diaphragmatic lymph nodes. - Liver biopsy on 02/21/2021 consistent with poorly differentiated carcinoma, gastric origin. - NGS ([facility]) on 01/23/2021-HER2 negative.  MSI-stable.  TMB-Low.  PD-L1 (28-8) negative.  Other targetable mutations negative.  ARID1A and T p53 pathogenic variants detected.     Social/family history: - Her sister lives with her.  She is independent of ADLs and IADLs.  She worked as a Programmer, applications and retired in 2021.  Non-smoker. - Father had colon cancer.  Brother had lung cancer.  One paternal cousin had lung cancer and another paternal cousin had prostate cancer.  Paternal uncle had lung cancer and paternal aunt had pancreatic cancer.   PLAN:  Stage IV (TxN1M1) poorly differentiated gastric carcinoma: - I have reviewed pathology reports from the liver biopsy. - I have also reviewed NGS mutation testing which did not show any targetable mutations. - As HER2 was negative, no indication for Herceptin. - We discussed palliative management of metastatic gastric cancer with first-line therapy with FOLFOX based regimen. - We discussed schedule and side effects of this regimen in detail.  Printed literature was given to the patient. - Might consider adding nivolumab during second cycle based on tolerance.  2.  Persistent nausea: - Continue Compazine every 6 hours as needed.  She was instructed to take Compazine 1 tablet in the  morning. - We will also start her on scopolamine patch.  3.  Weight loss: - She has lost about 16 pounds in the last 1 month.  She reports decrease in appetite. - She is drinking about half to 1 can of Ensure daily. - I have recommended to increase Ensure intake to amount to 1800 cal/day. - We will also make a referral for dietary evaluation.  4.  Normocytic anemia: - CBC on 02/21/2021 shows hemoglobin 10.8 with 83 MCV. - We will check ferritin, iron panel, B14, folic acid on the day 1 of treatment.   Orders placed this encounter:  No orders of the defined types were placed in this encounter.  Total time spent is 40 minutes with more than 50% of the time spent face-to-face discussing biopsy results, treatment plan, side effects, counseling and coordination of care.  Derek Jack, MD [place] [phone]   I, Thana Ates, am acting as a scribe for Dr. Derek Jack.  I, Derek Jack MD, have reviewed the above documentation for accuracy and completeness, and I agree with the above.

## 2021-03-08 ENCOUNTER — Encounter (HOSPITAL_COMMUNITY): Payer: Self-pay | Admitting: Hematology

## 2021-03-08 ENCOUNTER — Other Ambulatory Visit: Payer: Self-pay

## 2021-03-08 ENCOUNTER — Ambulatory Visit (HOSPITAL_COMMUNITY): Payer: 59 | Admitting: Dietician

## 2021-03-08 ENCOUNTER — Inpatient Hospital Stay (HOSPITAL_COMMUNITY): Payer: 59 | Attending: Hematology | Admitting: Hematology

## 2021-03-08 ENCOUNTER — Encounter (HOSPITAL_COMMUNITY): Payer: Self-pay

## 2021-03-08 VITALS — BP 125/87 | HR 88 | Temp 97.6°F | Resp 18 | Ht 66.0 in | Wt 170.4 lb

## 2021-03-08 DIAGNOSIS — R110 Nausea: Secondary | ICD-10-CM | POA: Diagnosis not present

## 2021-03-08 DIAGNOSIS — Z5111 Encounter for antineoplastic chemotherapy: Secondary | ICD-10-CM | POA: Diagnosis present

## 2021-03-08 DIAGNOSIS — D649 Anemia, unspecified: Secondary | ICD-10-CM | POA: Insufficient documentation

## 2021-03-08 DIAGNOSIS — Z79899 Other long term (current) drug therapy: Secondary | ICD-10-CM | POA: Diagnosis not present

## 2021-03-08 DIAGNOSIS — R634 Abnormal weight loss: Secondary | ICD-10-CM | POA: Insufficient documentation

## 2021-03-08 DIAGNOSIS — E876 Hypokalemia: Secondary | ICD-10-CM | POA: Insufficient documentation

## 2021-03-08 DIAGNOSIS — Z95828 Presence of other vascular implants and grafts: Secondary | ICD-10-CM | POA: Insufficient documentation

## 2021-03-08 DIAGNOSIS — C169 Malignant neoplasm of stomach, unspecified: Secondary | ICD-10-CM | POA: Insufficient documentation

## 2021-03-08 HISTORY — DX: Presence of other vascular implants and grafts: Z95.828

## 2021-03-08 MED ORDER — MEGESTROL ACETATE 400 MG/10ML PO SUSP: 400.0000 mg | Freq: Two times a day (BID) | ORAL | 2 refills | Status: DC

## 2021-03-08 MED ORDER — SCOPOLAMINE 1 MG/3DAYS TD PT72: 1.0000 | MEDICATED_PATCH | TRANSDERMAL | 12 refills | Status: DC

## 2021-03-08 NOTE — Patient Instructions (Signed)
Hannah Knight at [facility] Discharge Instructions   You were seen and examined today by Dr. Delton Coombes.  He reviewed the results of your liver biopsy, which shows your stomach cancer has spread to your liver.  This means we cannot cure this cancer, but it can be controlled with treatment. We will give you treatment as long as it is helping to control this cancer.   We will plan to start you on treatment with a chemotherapy regimen called FOLFOX - we will give you written information about these chemo drugs.  Return as scheduled for chemotherapy education class - we will review the chemotherapy drugs in detail, as well as how to manage side effects should they arise.  We sent a prescription to your pharmacy for a patch to place behind your ear.  This is a scopolamine patch that you will change every 3 days.  Increase your calorie intake - try drinking Ensure Plus, which has more calories.  If you are not eating any solid foods, please drink at least 5 cans of Boost/Ensure Plus every day.  Return as scheduled for lab work and treatment.    Thank you for choosing [place] at [facility] to provide your oncology and hematology care.  To afford each patient quality time with our provider, please arrive at least 15 minutes before your scheduled appointment time.   If you have a lab appointment with the [facility] please come in thru the Main Entrance and check in at the main information desk.  You need to re-schedule your appointment should you arrive 10 or more minutes late.  We strive to give you quality time with our providers, and arriving late affects you and other patients whose appointments are after yours.  Also, if you no show three or more times for appointments you may be dismissed from the clinic at the providers discretion.     Again, thank you for choosing [facility].  Our hope is that these requests will decrease the  amount of time that you wait before being seen by our physicians.       _____________________________________________________________  Should you have questions after your visit to [facility], please contact our office at [phone] and follow the prompts.  Our office hours are 8:00 a.m. and 4:30 p.m. Monday - Friday.  Please note that voicemails left after 4:00 p.m. may not be returned until the following business day.  We are closed weekends and major holidays.  You do have access to a nurse 24-7, just call the main number to the clinic [phone] and do not press any options, hold on the line and a nurse will answer the phone.    For prescription refill requests, have your pharmacy contact our office and allow 72 hours.    Due to Covid, you will need to wear a mask upon entering the hospital. If you do not have a mask, a mask will be given to you at the Main Entrance upon arrival. For doctor visits, patients may have 1 support person age 1 or older with them. For treatment visits, patients can not have anyone with them due to social distancing guidelines and our immunocompromised population.

## 2021-03-08 NOTE — Progress Notes (Signed)
Nutrition Assessment   Reason for Assessment: Provider request    ASSESSMENT: 66 year old with newly diagnosed stage IV gastric cancer. She is pending start of chemotherapy. First FOLFOX scheduled for 1/10.   Past medical history includes DM, Graves disease, HTN  Met with patient and sister in clinic. Patient is feeling nauseas, holding emesis bag. Patient reports excessive saliva production and indigestion ongoing for the past couple of months. Patient reports she has not been able to eat much due to this, states that food often will not stay down. Patient recalls tolerating small amounts of yogurt, cottage cheese, milk. She had a few bites of applesauce this morning. Patient is tolerating a couple Ensure, she likes the butter pecan flavor ok. Patient reports unable to tolerate chocolate due to indigestion.   Nutrition Focused Physical Exam:  Mild muscle depletion - temple, dorsal hand Moderate muscle depletion - clavicle and acromion  Moderate fat depletion - orbital, buccal   Medications: lipitor, synthroid, metformin, cozaar, compazine   Labs: 12/8 - K 3.3, Glucose 110, Cr 1.14 Last HgbA1c 6.9 on 4/22  Anthropometrics:   Height: 5'6" Weight: 170 lb 6.4 oz UBW: 203 lb 6.4 oz (12/26/20) BMI: 27.50   NUTRITION DIAGNOSIS: Patient meets criteria for severe malnutrition related to chronic illness (gastric cancer) as evidenced by intake <75% of estimated energy requirement for >/= one month per dietary recall, significant 16% (33 lb) wt loss in the last 10 weeks, mild/moderate fat and muscle depletions   INTERVENTION:  Encouraged small frequent meals/snacks with adequate calories and protein - handout with ideas provided Suggested drinking 2-3 Ensure Plus/equivalent daily - provided samples of Ensure Complete, Ensure Clear, CIB powder today Discussed strategies for thick saliva - handout with tips provided Discussed strategies for nausea - handout with tips provided Take nausea  medication as prescribed per MD Contact information provided    MONITORING, EVALUATION, GOAL:  Patient will tolerate adequate calories and protein to minimize further weight loss   Next Visit: ~ 4 weeks via telephone

## 2021-03-08 NOTE — Progress Notes (Signed)
I met with the patient today following treatment options discussion with Dr. Delton Coombes. Written information provided for Folfox and Opdivo as discussed by Dr. Delton Coombes. Referral made for patient to be seen by nutritionist while in the clinic today. Brief report provided to [place]. Patient agreeable to remain in clinic for visit with Vinnie Level.

## 2021-03-08 NOTE — Patient Instructions (Addendum)
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  You will be treated in the clinic every 2 weeks with a combination of chemotherapy drugs.  Those medications are oxaliplatin, leucovorin, and fluorouracil.  We may also add an immunotherapy drug called Opdivo to your treatment regimen after the first or second treatment.  The intent of treatment is to control this cancer, prevent it from spreading further, and to eliminate any symptoms you may be having related to this disease.  We will continue this treatment until it no longer works to keep this cancer under control, or if you should develop any toxicities related to this treatment. You will see the doctor regularly throughout treatment.  We will obtain blood work from you prior to every treatment and monitor your results to make sure it is safe to give your treatment. The doctor monitors your response to treatment by the way you are feeling, your blood work, and by obtaining scans periodically. There will be wait times while you are here for treatment.  It will take about 30 minutes to 1 hour for your lab work to result.  Then there will be wait times while pharmacy mixes your medications.    Medications you will receive in the clinic prior to your chemotherapy medications:  Aloxi:  ALOXI is used in adults to help prevent nausea and vomiting that happens with certain chemotherapy drugs.  Aloxi is a long acting medication, and will remain in your system for about two days.   Dexamethasone:  This is a steroid given prior to chemotherapy to help prevent allergic reactions; it may also help prevent and control nausea and diarrhea.     Oxaliplatin (Eloxatin)  About This Drug  Oxaliplatin is used to treat cancer. It is given in the vein (IV).  It takes two hours to infuse.  Possible Side Effects   Bone marrow suppression. This is a decrease in the number of white blood cells, red blood cells, and  platelets. This may raise your risk of infection, make you tired and weak (fatigue), and raise your risk of bleeding.   Tiredness   Soreness of the mouth and throat. You may have red areas, white patches, or sores that hurt.   Nausea and vomiting (throwing up)   Diarrhea (loose bowel movements)   Changes in your liver function   Effects on the nerves called peripheral neuropathy. You may feel numbness, tingling, or pain in your hands and feet, and may be worse in cold temperatures. It may be hard for you to button your clothes, open jars, or walk as usual. The effect on the nerves may get worse with more doses of the drug. These effects get better in some people after the drug is stopped but it does not get better in all people  Note: Each of the side effects above was reported in 40% or greater of patients treated with oxaliplatin. Not all possible side effects are included above.   Warnings and Precautions   Allergic reactions, including anaphylaxis, which may be life-threatening are rare but may happen in some patients. Signs of allergic reaction to this drug may be swelling of the face, feeling like your tongue or throat are swelling, trouble breathing, rash, itching, fever, chills, feeling dizzy, and/or feeling that your heart is beating in a fast or not normal way. If this happens, do not take another dose of this drug. You should get urgent medical treatment.   Inflammation (  swelling) of the lungs, which may be life-threatening. You may have a dry cough or trouble breathing.   Effects on the nerves (neuropathy) may resolve within 14 days, or it may persist beyond 14 days.   Severe decrease in white blood cells when combined with the chemotherapy agents 5-fluorouracil and leucovorin. This may be life-threatening.   Severe changes in your liver function   Abnormal heart beat and/or EKG, which can be life-threatening   Rhabdomyolysis- damage to your muscles which may release  proteins in your blood and affect how your kidneys work, which can be life-threatening. You may have severe muscle weakness and/or pain, or dark urine.  Important Information   This drug may impair your ability to drive or use machinery. Talk to your doctor and/or nurse about precautions you may need to take.   This drug may be present in the saliva, tears, sweat, urine, stool, vomit, semen, and vaginal secretions. Talk to your doctor and/or your nurse about the necessary precautions to take during this time.  * The effects on the nerves can be aggravated by exposure to cold. Avoid cold beverages, use of ice and make sure you cover your skin and dress warmly prior to being exposed to cold temperatures while you are receiving treatment with oxaliplatin*   Treating Side Effects   Manage tiredness by pacing your activities for the day.   Be sure to include periods of rest between energy-draining activities.   To decrease the risk of infection, wash your hands regularly.   Avoid close contact with people who have a cold, the flu, or other infections.  Take your temperature as your doctor or nurse tells you, and whenever you feel like you may have a fever.   To help decrease the risk of bleeding, use a soft toothbrush. Check with your nurse before using dental floss.   Be very careful when using knives or tools.   Use an electric shaver instead of a razor.   Drink plenty of fluids (a minimum of eight glasses per day is recommended).   Mouth care is very important. Your mouth care should consist of routine, gentle cleaning of your teeth or dentures and rinsing your mouth with a mixture of 1/2 teaspoon of salt in 8 ounces of water or 1/2 teaspoon of baking soda in 8 ounces of water. This should be done at least after each meal and at bedtime.   If you have mouth sores, avoid mouthwash that has alcohol. Also avoid alcohol and smoking because they can bother your mouth and throat.   To help  with nausea and vomiting, eat small, frequent meals instead of three large meals a day. Choose foods and drinks that are at room temperature. Ask your nurse or doctor about other helpful tips and medicine that is available to help stop or lessen these symptoms.   If you throw up or have loose bowel movements, you should drink more fluids so that you do not become dehydrated (lack of water in the body from losing too much fluid).   If you have diarrhea, eat low-fiber foods that are high in protein and calories and avoid foods that can irritate your digestive tracts or lead to cramping.   Ask your nurse or doctor about medicine that can lessen or stop your diarrhea.   If you have numbness and tingling in your hands and feet, be careful when cooking, walking, and handling sharp objects and hot liquids.   Do not drink cold drinks  or use ice in beverages. Drink fluids at room temperature or warmer, and drink through a straw.   Wear gloves to touch cold objects, and wear warm clothing and cover you skin during cold weather.   Food and Drug Interactions   There are no known interactions of oxaliplatin with food and other medications.   This drug may interact with other medicines. Tell your doctor and pharmacist about all the prescription and over-the-counter medicines and dietary supplements (vitamins, minerals, herbs and others) that you are taking at this time. Also, check with your doctor or pharmacist before starting any new prescription or over-the-counter medicines, or dietary supplements to make sure that there are no interactions   When to Call the Doctor  Call your doctor or nurse if you have any of these symptoms and/or any new or unusual symptoms:   Fever of 100.4 F (38 C) or higher   Chills   Tiredness that interferes with your daily activities   Feeling dizzy or lightheaded   Easy bleeding or bruising   Feeling that your heart is beating in a fast or not normal way  (palpitations)   Pain in your chest   Dry cough   Trouble breathing   Pain in your mouth or throat that makes it hard to eat or drink   Nausea that stops you from eating or drinking and/or is not relieved by prescribed medicines   Throwing up   Diarrhea, 4 times in one day or diarrhea with lack of strength or a feeling of being dizzy   Numbness, tingling, or pain in your hands and feet   Signs of possible liver problems: dark urine, pale bowel movements, bad stomach pain, feeling very tired and weak, unusual itching, or yellowing of the eyes or skin   Signs of rhabdomyolysis: decreased urine, very dark urine, muscle pain in the shoulders, thighs, or lower back; muscle weakness or trouble moving arms and legs   Signs of allergic reaction: swelling of the face, feeling like your tongue or throat are swelling, trouble breathing, rash, itching, fever, chills, feeling dizzy, and/or feeling that your heart is beating in a fast or not normal way. If this happens, call 911 for emergency care.   If you think you may be pregnant  Reproduction Warnings   Pregnancy warning: This drug may have harmful effects on the unborn baby. Women of childbearing potential should use effective methods of birth control during your cancer treatment. Let your doctor know right away if you think you may be pregnant or may have impregnated your partner.   Breastfeeding warning: It is not known if this drug passes into breast milk. For this reason, women should talk to their doctor about the risks and benefits of breastfeeding during treatment with this drug because this drug may enter the breast milk and cause harm to a breastfeeding baby.   Fertility warning: Human fertility studies have not been done with this drug. Talk with your doctor or nurse if you plan to have children. Ask for information on sperm or egg banking.   Leucovorin Calcium  About This Drug  Leucovorin is a vitamin. It is used in  combination with other cancer fighting drugs such as 5-fluorouracil and methotrexate. Leucovorin is given in the vein (IV).  This drug runs at the same time as the oxaliplatin and takes 2 hours to infuse.   Possible Side Effects  Rash and itching  Note: Leucovorin by itself has very few side effects. Other side  effects you may have can be caused by the other drugs you are taking, such as 5-fluorouracil.   Warnings and Precautions   Allergic reactions, including anaphylaxis are rare but may happen in some patients. Signs of allergic reaction to this drug may be swelling of the face, feeling like your tongue or throat are swelling, trouble breathing, rash, itching, fever, chills, feeling dizzy, and/or feeling that your heart is beating in a fast or not normal way. If this happens, do not take another dose of this drug. You should get urgent medical treatment.  Food and Drug Interactions   There are no known interactions of leucovorin with food.   This drug may interact with other medicines. Tell your doctor and pharmacist about all the prescription and over-the-counter medicines and dietary supplements (vitamins, minerals, herbs and others) that you are taking at this time.   Also, check with your doctor or pharmacist before starting any new prescription or over-the-counter medicines, or dietary supplements to make sure that there are no interactions.   When to Call the Doctor  Call your doctor or nurse if you have any of these symptoms and/or any new or unusual symptoms:   A new rash or a rash that is not relieved by prescribed medicines   Signs of allergic reaction: swelling of the face, feeling like your tongue or throat are swelling, trouble breathing, rash, itching, fever, chills, feeling dizzy, and/or feeling that your heart is beating in a fast or not normal way. If this happens, call 911 for emergency care.   If you think you may be pregnant   Reproduction Warnings   Pregnancy  warning: It is not known if this drug may harm an unborn child. For this reason, be sure to talk with your doctor if you are pregnant or planning to become pregnant while receiving this drug. Let your doctor know right away if you think you may be pregnant   Breastfeeding warning: It is not known if this drug passes into breast milk. For this reason, women should talk to their doctor about the risks and benefits of breastfeeding during treatment with this drug because this drug may enter the breast milk and cause harm to a breastfeeding baby.   Fertility warning: Human fertility studies have not been done with this drug. Talk with your doctor or nurse if you plan to have children. Ask for information on sperm or egg banking.   5-Fluorouracil (Adrucil; 5FU)  About This Drug  Fluorouracil is used to treat cancer. It is given in the vein (IV). It is given as an IV push from a syringe and also as a continuous infusion given via an ambulatory pump (a pump you take home and wear for a specified amount of time).  Possible Side Effects   Bone marrow suppression. This is a decrease in the number of white blood cells, red blood cells, and platelets. This may raise your risk of infection, make you tired and weak (fatigue), and raise your risk of bleeding   Changes in the tissue of the heart and/or heart attack. Some changes may happen that can cause your heart to have less ability to pump blood.   Blurred vision or other changes in eyesight   Nausea and throwing up (vomiting)   Diarrhea (loose bowel movements)   Ulcers - sores that may cause pain or bleeding in your digestive tract, which includes your mouth, esophagus, stomach, small/large intestines and rectum   Soreness of the mouth and throat.  You may have red areas, white patches, or sores that hurt.   Allergic reactions, including anaphylaxis are rare but may happen in some patients. Signs of allergic reaction to this drug may be swelling of  the face, feeling like your tongue or throat are swelling, trouble breathing, rash, itching, fever, chills, feeling dizzy, and/or feeling that your heart is beating in a fast or not normal way. If this happens, do not take another dose of this drug. You should get urgent medical treatment.   Sensitivity to light (photosensitivity). Photosensitivity means that you may become more sensitive to the sun and/or light. You may get a skin rash/reaction if you are in the sun or are exposed to sun lamps and tanning beds. Your eyes may water more, mostly in bright light.   Changes in your nail color, nail loss and/or brittle nail   Darkening of the skin, or changes to the color of your skin and/or veins used for infusion   Rash, dry skin, or itching  Note: Not all possible side effects are included above.  Warnings and Precautions   Hand-and-foot syndrome. The palms of your hands or soles of your feet may tingle, become numb, painful, swollen, or red.   Changes in your central nervous system can happen. The central nervous system is made up of your brain and spinal cord. You could feel extreme tiredness, agitation, confusion, hallucinations (see or hear things that are not there), trouble understanding or speaking, loss of control of your bowels or bladder, eyesight changes, numbness or lack of strength to your arms, legs, face, or body, or coma. If you start to have any of these symptoms let your doctor know right away.   Side effects of this drug may be unexpectedly severe in some patients  Note: Some of the side effects above are very rare. If you have concerns and/or questions, please discuss them with your medical team.   Important Information   This drug may be present in the saliva, tears, sweat, urine, stool, vomit, semen, and vaginal secretions. Talk to your doctor and/or your nurse about the necessary precautions to take during this time.   Treating Side Effects   Manage tiredness by  pacing your activities for the day.   Be sure to include periods of rest between energy-draining activities.   To help decrease the risk of infections, wash your hands regularly.   Avoid close contact with people who have a cold, the flu, or other infections.   Take your temperature as your doctor or nurse tells you, and whenever you feel like you may have a fever.   Use a soft toothbrush. Check with your nurse before using dental floss.   Be very careful when using knives or tools.   Use an electric shaver instead of a razor.   If you have a nose bleed, sit with your head tipped slightly forward. Apply pressure by lightly pinching the bridge of your nose between your thumb and forefinger. Call your doctor if you feel dizzy or faint or if the bleeding doesnt stop after 10 to 15 minutes.   Drink plenty of fluids (a minimum of eight glasses per day is recommended).   If you throw up or have loose bowel movements, you should drink more fluids so that you do not  become dehydrated (lack of water in the body from losing too much fluid).   To help with nausea and vomiting, eat small, frequent meals instead of three large meals a  day. Choose foods and drinks that are at room temperature. Ask your nurse or doctor about other helpful tips and medicine that is available to help, stop, or lessen these symptoms.   If you have diarrhea, eat low-fiber foods that are high in protein and calories and avoid foods that can irritate your digestive tracts or lead to cramping.   Ask your nurse or doctor about medicine that can lessen or stop your diarrhea.   Mouth care is very important. Your mouth care should consist of routine, gentle cleaning of your teeth or dentures and rinsing your mouth with a mixture of 1/2 teaspoon of salt in 8 ounces of water or 1/2 teaspoon of baking soda in 8 ounces of water. This should be done at least after each meal and at bedtime.   If you have mouth sores, avoid  mouthwash that has alcohol. Also avoid alcohol and smoking because they can bother your mouth and throat.   Keeping your nails moisturized may help with brittleness.   To help with itching, moisturize your skin several times day.   Use sunscreen with SPF 30 or higher when you are outdoors even for a short time. Cover up when you are out in the sun. Wear wide-brimmed hats, long-sleeved shirts, and pants. Keep your neck, chest, and back covered. Wear dark sun glasses when in the sun or bright lights.   If you get a rash do not put anything on it unless your doctor or nurse says you may. Keep the area around the rash clean and dry. Ask your doctor for medicine if your rash bothers you.   Keeping your pain under control is important to your well-being. Please tell your doctor or nurse if you are experiencing pain.   Food and Drug Interactions   There are no known interactions of fluorouracil with food.   Check with your doctor or pharmacist about all other prescription medicines and over-the-counter medicines and dietary supplements (vitamins, minerals, herbs and others) you are taking before starting this medicine as there are known drug interactions with 5-fluoroucacil. Also, check with your doctor or pharmacist before starting any new prescription or over-the-counter medicines, or dietary supplements to make sure that there are no interactions.  When to Call the Doctor  Call your doctor or nurse if you have any of these symptoms and/or any new or unusual symptoms:   Fever of 100.4 F (38 C) or higher   Chills   Easy bleeding or bruising   Nose bleed that doesnt stop bleeding after 10-15 minutes   Trouble breathing   Feeling dizzy or lightheaded   Feeling that your heart is beating in a fast or not normal way (palpitations)   Chest pain or symptoms of a heart attack. Most heart attacks involve pain in the center of the chest that lasts more than a few minutes. The pain may go  away and come back or it can be constant. It can feel like pressure, squeezing, fullness, or pain. Sometimes pain is felt in one or both arms, the back, neck, jaw, or stomach. If any of these symptoms last 2 minutes, call 911.   Confusion and/or agitation   Hallucinations   Trouble understanding or speaking   Loss of control of bowels or bladder   Blurry vision or changes in your eyesight   Headache that does not go away   Numbness or lack of strength to your arms, legs, face, or body   Nausea that stops you from  eating or drinking and/or is not relieved by prescribed medicines   Throwing up more than 3 times a day   Diarrhea, 4 times in one day or diarrhea with lack of strength or a feeling of being dizzy   Pain in your mouth or throat that makes it hard to eat or drink   Pain along the digestive tract - especially if worse after eating   Blood in your vomit (bright red or coffee-ground) and/or stools (bright red, or black/tarry)   Coughing up blood   Tiredness that interferes with your daily activities   Painful, red, or swollen areas on your hands or feet or around your nails   A new rash or a rash that is not relieved by prescribed medicines   Develop sensitivity to sunlight/light   Numbness and/or tingling of your hands and/or feet   Signs of allergic reaction: swelling of the face, feeling like your tongue or throat are swelling, trouble breathing, rash, itching, fever, chills, feeling dizzy, and/or feeling that your heart is beating in a fast or not normal way. If this happens, call 911 for emergency care.   If you think you are pregnant or may have impregnated your partner  Reproduction Warnings   Pregnancy warning: This drug may have harmful effects on the unborn baby. Women of child bearing potential should use effective methods of birth control during your cancer treatment and 3 months after treatment. Men with female partners of childbearing potential should  use effective methods of birth control during your cancer treatment and for 3 months after your cancer treatment. Let your doctor know right away if you think you may be pregnant or may have impregnated your partner.   Breastfeeding warning: It is not known if this drug passes into breast milk. For this reason, Women should not breastfeed during treatment because this drug could enter the breast milk and cause harm to a breastfeeding baby.   Fertility warning: In men and women both, this drug may affect your ability to have children in the future. Talk with your doctor or nurse if you plan to have children. Ask for information on sperm or egg banking.   Nivolumab (Opdivo)  About This Drug Nivolumab is used to treat cancer. It is given in the vein (IV). It will take 30 minutes to infuse.   Possible Side Effects  Nausea and vomiting   Pain in your abdomen   Diarrhea (loose bowel movements)   Constipation (not able to move bowels)   Tiredness and weakness   Fever   Decreased appetite (decreased hunger)   Joint, muscle and bone pain   Back pain   Headache   Cough and trouble breathing   Upper respiratory tract infection   Urinary tract infection   Rash and itching  Note: Each of the side effects above was reported in 20% or greater of patients treated with nivolumab alone. Your side effects may be different or more severe if you receive nivolumab in combination with other chemotherapy agents. Not all possible side effects are included above.  Warnings and Precautions  This drug works with your immune system and can cause inflammation in any of your organs and tissues and can change how they work. This may put you at risk for developing serious medical problems which can be life-threatening. These side effects may require treatment with steroids at the discretion of your doctor.   Inflammation (swelling) of the lungs which can be life-threatening. You may have a dry  cough or  trouble breathing.   Colitis, which is swelling (inflammation) in the colon. The symptoms are loose bowel movements (diarrhea), stomach cramping, and sometimes blood in the bowel movements.   Changes in your central nervous system can happen. The central nervous system is made up of your brain and spinal cord. You could feel extreme tiredness, agitation, confusion, hallucinations (see or hear things that are not there), trouble understanding or speaking, loss of control of your bowels or bladder, eyesight changes, numbness or lack of strength to your arms, legs, face, or body, and coma. If you start to have any of these symptoms let your doctor know right away.   Severe changes in your liver function, which can cause liver failure and be life-threatening.   This drug may affect some of your hormone glands (especially the thyroid, adrenals, pituitary, and pancreas).   Blood sugar levels may change, and you may develop diabetes. If you already have diabetes, changes may need to be made to your diabetes medication.   Changes in your kidney function   Allergic skin reaction which can very rarely be life-threatening. You may develop blisters on your skin that are filled with fluid or a severe red rash all over your body that may be painful.   While you are getting this drug in your vein (IV), you may have a reaction to the drug. Sometimes you may be given medication to stop or lessen these side effects. Your nurse will check you closely for these signs: fever or shaking chills, flushing, facial swelling, feeling dizzy, headache, trouble breathing, rash, itching, chest tightness, or chest pain. These reactions may happen after your infusion. If this happens, call 911 for emergency care.   Increased risk of serious complications that can be life-threatening such as graft versus host disease (GVHD) in patients who undergo a stem cell transplant before or after receiving nivolumab.   Increased risk of  organ rejection in patients who have received donor organs  Important Information  This drug may be present in the saliva, tears, sweat, urine, stool, vomit, semen, and vaginal secretions. Talk to your doctor and/or your nurse about the necessary precautions to take during this time.  Treating Side Effects  Manage tiredness by pacing your activities for the day.   Be sure to include periods of rest between energy-draining activities.   Get regular exercise. If you feel too tired to exercise vigorously, try taking a short walk.   To help decrease the risk of bleeding, use a soft toothbrush. Check with your nurse before using dental floss.   Be very careful when using knives or tools.   Use an electric shaver instead of a razor.   Drink plenty of fluids (a minimum of eight glasses per day is recommended).   To help with nausea and vomiting, eat small, frequent meals instead of three large meals a day. Choose foods and drinks that are at room temperature.   If you throw up or have diarrhea, you should drink more fluids so that you do not become dehydrated (lack of water in the body from losing too much fluid).   If you have diarrhea, eat low-fiber foods that are high in protein and calories, and avoid foods that can irritate your digestive tracts or lead to cramping.   If you are not able to move your bowels, check with your doctor or nurse before you use any enemas, laxatives, or suppositories.   Ask your doctor or nurse about medicine that  is available to help stop or lessen diarrhea, constipation, and/or nausea/vomiting.   To help with decreased appetite, eat small, frequent meals. Eat foods high in calories and protein, such as meat, poultry, fish, dry beans, tofu, eggs, nuts, milk, yogurt, cheese, ice cream, pudding, and nutritional supplements.   Consider using sauces and spices to increase taste. Daily exercise, with your doctors approval, may increase your appetite.   If you  have diabetes, keep good control of your blood sugar level. Tell your nurse or your doctor if your glucose levels are higher or lower than normal.   Keeping your pain under control is important to your well-being. Please tell your doctor or nurse if you are experiencing pain.   If you get a rash do not put anything on it unless your doctor or nurse says you may. Keep the area around the rash clean and dry. Ask your doctor for medicine if your rash bothers you.   To help with itching, moisturize your skin several times a day.   Avoid sun exposure and apply sunscreen routinely when outdoors.   If you have numbness and tingling in your hands and feet, be careful when cooking, walking, and handling sharp objects and hot liquids.   Infusion reactions may happen after your infusion. If this happens, call 911 for emergency care.  Food and Drug Interactions  There are no known interactions of nivolumab with food.   This drug may interact with other medicines. Tell your doctor and pharmacist about all the prescription and over-the-counter medicines and dietary supplements (vitamins, minerals, herbs, and others) that you are taking at this time. Also, check with your doctor or pharmacist before starting any new prescription or over-the-counter medicines, or dietary supplements to make sure that there are no interactions.  When to Call the Doctor Not all possible side effects are included. Some of these side effects, although rare, can be lifethreatening.  Lung problems:  Inflammation of the lungs   Cough   Trouble breathing   Upper respiratory tract infection  Call your doctor or nurse if you have any of these symptoms:  Wheezing or trouble breathing   New or worsening cough   Chest pain   Coughing up yellow, green, or blood mucus  Stomach problems:  Decreased appetite (decreased hunger)   Nausea and vomiting (throwing up)   Diarrhea (loose bowel movements)   Constipation  (unable to move bowels)   Pain in your abdomen   Inflammation of your colon   Blood in your stool  Call your doctor or nurse if you have any of these symptoms:  Nausea that stops you from eating or drinking or is not relieved by prescribed medicines   Throwing up more than 3 times a day   Lasting loss of appetite or rapid weight loss of five pounds in a week   Diarrhea, 4 times in one day or diarrhea with lack of strength or a feeling of being dizzy   No bowel movement for 3 days or you feel uncomfortable   Pain in your abdomen that does not go away   Blood in your stool (bright red or black/tarry)  Liver problems:  Changes in your liver function  Call your doctor or nurse if you have any of these symptoms:  Yellowing of the eyes or skin   Dark urine   Pale bowel movements   Pain on the right side of your abdomen that does not go away   Feeling very tired and  weak   Unusual itching   Easy bleeding or bruising  Hormone gland problems:  Changes in some of your hormone glands (especially the thyroid, adrenals, pituitary, and pancreas)   Blood sugar levels may change, and you may develop diabetes  Call your doctor or nurse if you have any of these symptoms:  Headache that does not go away   Tiredness and weakness that interferes with your daily activities   Feeling dizzy or lightheaded   Changes in mood or behavior such as irritability and/or feeling forgetful   Shakiness   Weight loss or weight gain   Nausea   Abnormal blood sugar   Unusual thirst or passing urine often   Feeling cold  Kidney problems:  Changes in your kidney function   Urinary tract infection  Call your doctor or nurse if you have any of these symptoms:  Decreased or very dark urine   Cloudy urine and/or urine that smells bad   Difficulty urinating   Pain or burning when you pass urine   Feeling like you have to pass urine often, but not much comes out when you do    Tender or heavy feeling in your lower abdomen   Pain on one side of your back under your ribs  Skin problems:  Rash and itching   Soreness of the mouth and throat   Allergic skin reaction  Call your doctor or nurse if you have any of these symptoms:  New rash and/or itching   Fluid-filled bumps/blisters   Rash that is not relieved by prescribed medicines   Red areas, white patches, or sores in your mouth that hurt  Inflammation of the brain:  Changes in your brain and spinal cord   Headache   Effects on the nerves  Call your doctor or nurse if you have any of these symptoms:  Headache that does not go away   Extreme tiredness, agitation, or confusion   Seizures   Hallucinations   Trouble understanding or speaking   Loss of control of bowels or bladder   Numbness or lack of strength to your arms, legs, face, or body   Numbness, tingling, pins, and needles, or pain in your arms, hands, legs, or feet  Other problems:  Low red blood cells, and platelets   Fever   Inflammation of your eye and/or other changes in vision   Allergic reaction to the drug   Heart problems   Electrolyte changes   Muscle, bone, and joint pain  Call your doctor or nurse if you have any of these symptoms:  Fever of 100.4 F (38 C) or higher   Chills, flushing   Easy bleeding or bruising   Blurred vision or other changes in eyesight   Sensitivity to light   Feeling that your heart is beating fast or in a not normal way (palpitations)    Signs of infusion reaction: fever or shaking chills, flushing, facial swelling, feeling dizzy, headache, trouble breathing, rash, itching, chest tightness, or chest pain. If this happens, call 911 for emergency care.   Pain that does not go away, or is not relieved by prescribed medicines   Extreme muscle weakness  Reproduction Warnings  Pregnancy warning: This drug can have harmful effects on the unborn baby. Women of childbearing  potential should use effective methods of birth control during your cancer treatment and for at least 5 months after stopping treatment. Let your doctor know right away if you think you may be pregnant.  Breastfeeding warning: It is not known if this drug passes into breast milk. For this reason, women should not breastfeed during treatment and for 5 months after stopping treatment because this drug could enter the breast milk and cause harm to a breastfeeding baby.  Fertility warning: Fertility studies have not been done with this drug. Talk with your doctor or nurse if you plan to have children. Ask for information on sperm or egg banking.    SELF CARE ACTIVITIES WHILE RECEIVING CHEMOTHERAPY:  Hydration Increase your fluid intake 48 hours prior to treatment and drink at least 8 to 12 cups (64 ounces) of water/decaffeinated beverages per day after treatment. You can still have your cup of coffee or soda but these beverages do not count as part of your 8 to 12 cups that you need to drink daily. No alcohol intake.  Medications Continue taking your normal prescription medication as prescribed.  If you start any new herbal or new supplements please let us know first to make sure it is safe.  Mouth Care Have teeth cleaned professionally before starting treatment. Keep dentures and partial plates clean. Use soft toothbrush and do not use mouthwashes that contain alcohol. Biotene is a good mouthwash that is available at most pharmacies or may be ordered by calling [phone]. Use warm salt water gargles (1 teaspoon salt per 1 quart warm water) before and after meals and at bedtime. If you need dental work, please let the doctor know before you go for your appointment so that we can coordinate the best possible time for you in regards to your chemo regimen. You need to also let your dentist know that you are actively taking chemo. We may need to do labs prior to your dental appointment.  Skin  Care Always use sunscreen that has not expired and with SPF (Sun Protection Factor) of 50 or higher. Wear hats to protect your head from the sun. Remember to use sunscreen on your hands, ears, face, & feet.  Use good moisturizing lotions such as udder cream, eucerin, or even Vaseline. Some chemotherapies can cause dry skin, color changes in your skin and nails.    Avoid long, hot showers or baths. Use gentle, fragrance-free soaps and laundry detergent. Use moisturizers, preferably creams or ointments rather than lotions because the thicker consistency is better at preventing skin dehydration. Apply the cream or ointment within 15 minutes of showering. Reapply moisturizer at night, and moisturize your hands every time after you wash them.  Hair Loss (if your doctor says your hair will fall out)  If your doctor says that your hair is likely to fall out, decide before you begin chemo whether you want to wear a wig. You may want to shop before treatment to match your hair color. Hats, turbans, and scarves can also camouflage hair loss, although some people prefer to leave their heads uncovered. If you go bare-headed outdoors, be sure to use sunscreen on your scalp. Cut your hair short. It eases the inconvenience of shedding lots of hair, but it also can reduce the emotional impact of watching your hair fall out. Don't perm or color your hair during chemotherapy. Those chemical treatments are already damaging to hair and can enhance hair loss. Once your chemo treatments are done and your hair has grown back, it's OK to resume dyeing or perming hair.  With chemotherapy, hair loss is almost always temporary. But when it grows back, it may be a different color or texture. In older adults  who still had hair color before chemotherapy, the new growth may be completely gray.  Often, new hair is very fine and soft.  Infection Prevention Please wash your hands for at least 30 seconds using warm soapy water.  Handwashing is the #1 way to prevent the spread of germs. Stay away from sick people or people who are getting over a cold. If you develop respiratory systems such as green/yellow mucus production or productive cough or persistent cough let us know and we will see if you need an antibiotic. It is a good idea to keep a pair of gloves on when going into grocery stores/[employer] to decrease your risk of coming into contact with germs on the carts, etc. Carry alcohol hand gel with you at all times and use it frequently if out in public. If your temperature reaches 100.5 or higher please call the clinic and let us know.  If it is after hours or on the weekend please go to the ER if your temperature is over 100.5.  Please have your own personal thermometer at home to use.    Sex and bodily fluids If you are going to have sex, a condom must be used to protect the person that isn't taking chemotherapy. Chemo can decrease your libido (sex drive). For a few days after chemotherapy, chemotherapy can be excreted through your bodily fluids.  When using the toilet please close the lid and flush the toilet twice.  Do this for a few day after you have had chemotherapy.   Effects of chemotherapy on your sex life Some changes are simple and won't last long. They won't affect your sex life permanently.  Sometimes you may feel: too tired not strong enough to be very active sick or sore  not in the mood anxious or low Your anxiety might not seem related to sex. For example, you may be worried about the cancer and how your treatment is going. Or you may be worried about money, or about how you family are coping with your illness.  These things can cause stress, which can affect your interest in sex. It's important to talk to your partner about how you feel.  Remember - the changes to your sex life don't usually last long. There's usually no medical reason to stop having sex during chemo. The drugs won't have any long term  physical effects on your performance or enjoyment of sex. Cancer can't be passed on to your partner during sex  Contraception It's important to use reliable contraception during treatment. Avoid getting pregnant while you or your partner are having chemotherapy. This is because the drugs may harm the baby. Sometimes chemotherapy drugs can leave a man or woman infertile.  This means you would not be able to have children in the future. You might want to talk to someone about permanent infertility. It can be very difficult to learn that you may no longer be able to have children. Some people find counselling helpful. There might be ways to preserve your fertility, although this is easier for men than for women. You may want to speak to a fertility expert. You can talk about sperm banking or harvesting your eggs. You can also ask about other fertility options, such as donor eggs. If you have or have had breast cancer, your doctor might advise you not to take the contraceptive pill. This is because the hormones in it might affect the cancer. It is not known for sure whether or not chemotherapy drugs  can be passed on through semen or secretions from the vagina. Because of this some doctors advise people to use a barrier method if you have sex during treatment. This applies to vaginal, anal or oral sex. Generally, doctors advise a barrier method only for the time you are actually having the treatment and for about a week after your treatment. Advice like this can be worrying, but this does not mean that you have to avoid being intimate with your partner. You can still have close contact with your partner and continue to enjoy sex.  Animals If you have cats or birds we just ask that you not change the litter or change the cage.  Please have someone else do this for you while you are on chemotherapy.   Food Safety During and After Cancer Treatment Food safety is important for people both during and after cancer  treatment. Cancer and cancer treatments, such as chemotherapy, radiation therapy, and stem cell/bone marrow transplantation, often weaken the immune system. This makes it harder for your body to protect itself from foodborne illness, also called food poisoning. Foodborne illness is caused by eating food that contains harmful bacteria, parasites, or viruses.  Foods to avoid Some foods have a higher risk of becoming tainted with bacteria. These include: Unwashed fresh fruit and vegetables, especially leafy vegetables that can hide dirt and other contaminants Raw sprouts, such as alfalfa sprouts Raw or undercooked beef, especially ground beef, or other raw or undercooked meat and poultry Fatty, fried, or spicy foods immediately before or after treatment.  These can sit heavy on your stomach and make you feel nauseous. Raw or undercooked shellfish, such as oysters. Sushi and sashimi, which often contain raw fish.  Unpasteurized beverages, such as unpasteurized fruit juices, raw milk, raw yogurt, or cider Undercooked eggs, such as soft boiled, over easy, and poached; raw, unpasteurized eggs; or foods made with raw egg, such as homemade raw cookie dough and homemade mayonnaise  Simple steps for food safety  Shop smart. Do not buy food stored or displayed in an unclean area. Do not buy bruised or damaged fruits or vegetables. Do not buy cans that have cracks, dents, or bulges. Pick up foods that can spoil at the end of your shopping trip and store them in a cooler on the way home.  Prepare and clean up foods carefully. Rinse all fresh fruits and vegetables under running water, and dry them with a clean towel or paper towel. Clean the top of cans before opening them. After preparing food, wash your hands for 20 seconds with hot water and soap. Pay special attention to areas between fingers and under nails. Clean your utensils and dishes with hot water and soap. Disinfect your kitchen and cutting  boards using 1 teaspoon of liquid, unscented bleach mixed into 1 quart of water.    Dispose of old food. Eat canned and packaged food before its expiration date (the use by or best before date). Consume refrigerated leftovers within 3 to 4 days. After that time, throw out the food. Even if the food does not smell or look spoiled, it still may be unsafe. Some bacteria, such as Listeria, can grow even on foods stored in the refrigerator if they are kept for too long.  Take precautions when eating out. At restaurants, avoid buffets and salad bars where food sits out for a long time and comes in contact with many people. Food can become contaminated when someone with a virus, often a norovirus,  or another bug handles it. Put any leftover food in a to-go container yourself, rather than having the server do it. And, refrigerate leftovers as soon as you get home. Choose restaurants that are clean and that are willing to prepare your food as you order it cooked.   AT HOME MEDICATIONS:                                                                                                                                                                Compazine/Prochlorperazine 10mg  tablet. Take 1 tablet every 6 hours as needed for nausea/vomiting. (This can make you sleepy)   EMLA cream. Apply a quarter size amount to port site 1 hour prior to chemo. Do not rub in. Cover with plastic wrap.    Diarrhea Sheet   If you are having loose stools/diarrhea, please purchase Imodium and begin taking as outlined:  At the first sign of poorly formed or loose stools you should begin taking Imodium (loperamide) 2 mg capsules.  Take two tablets (4mg ) followed by one tablet (2mg ) every 2 hours - DO NOT EXCEED 8 tablets in 24 hours.  If it is bedtime and you are having loose stools, take 2 tablets at bedtime, then 2 tablets every 4 hours until morning.   Always call the [facility] if you are having loose  stools/diarrhea that you can't get under control.  Loose stools/diarrhea leads to dehydration (loss of water) in your body.  We have other options of trying to get the loose stools/diarrhea to stop but you must let us know!   Constipation Sheet  Colace - 100 mg capsules - take 2 capsules daily.  If this doesn't help then you can increase to 2 capsules twice daily.  Please call if the above does not work for you. Do not go more than 2 days without a bowel movement.  It is very important that you do not become constipated.  It will make you feel sick to your stomach (nausea) and can cause abdominal pain and vomiting.  Nausea Sheet   Compazine/Prochlorperazine 10mg  tablet. Take 1 tablet every 6 hours as needed for nausea/vomiting (This can make you drowsy).  If you are having persistent nausea (nausea that does not stop) please call the [facility] and let us know the amount of nausea that you are experiencing.  If you begin to vomit, you need to call the [facility] and if it is the weekend and you have vomited more than one time and can't get it to stop-go to the Emergency Room.  Persistent nausea/vomiting can lead to dehydration (loss of fluid in your body) and will make you feel very weak and unwell. Ice chips, sips of clear liquids, foods that are at room temperature, crackers, and toast  tend to be better tolerated.   SYMPTOMS TO REPORT AS SOON AS POSSIBLE AFTER TREATMENT:  FEVER GREATER THAN 100.4 F  CHILLS WITH OR WITHOUT FEVER  NAUSEA AND VOMITING THAT IS NOT CONTROLLED WITH YOUR NAUSEA MEDICATION  UNUSUAL SHORTNESS OF BREATH  UNUSUAL BRUISING OR BLEEDING  TENDERNESS IN MOUTH AND THROAT WITH OR WITHOUT   PRESENCE OF ULCERS  URINARY PROBLEMS  BOWEL PROBLEMS  UNUSUAL RASH      Wear comfortable clothing and clothing appropriate for easy access to any Portacath or PICC line. Let us know if there is anything that we can do to make your therapy better!    What to do if  you need assistance after hours or on the weekends: CALL [phone].  HOLD on the line, do not hang up.  You will hear multiple messages but at the end you will be connected with a nurse triage line.  They will contact the doctor if necessary.  Most of the time they will be able to assist you.  Do not call the hospital operator.      I have been informed and understand all of the instructions given to me and have received a copy. I have been instructed to call the clinic [phone] or my family physician as soon as possible for continued medical care, if indicated. I do not have any more questions at this time but understand that I may call the [facility] or the Patient Navigator at [phone] during office hours should I have questions or need assistance in obtaining follow-up care.

## 2021-03-08 NOTE — Progress Notes (Signed)
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

## 2021-03-12 ENCOUNTER — Other Ambulatory Visit (HOSPITAL_COMMUNITY): Payer: Self-pay | Admitting: *Deleted

## 2021-03-12 ENCOUNTER — Other Ambulatory Visit: Payer: Self-pay

## 2021-03-12 ENCOUNTER — Inpatient Hospital Stay (HOSPITAL_COMMUNITY): Payer: 59

## 2021-03-12 ENCOUNTER — Telehealth (HOSPITAL_COMMUNITY): Payer: Self-pay | Admitting: *Deleted

## 2021-03-12 VITALS — BP 120/74 | HR 92 | Temp 96.9°F | Resp 18

## 2021-03-12 DIAGNOSIS — E876 Hypokalemia: Secondary | ICD-10-CM

## 2021-03-12 DIAGNOSIS — C169 Malignant neoplasm of stomach, unspecified: Secondary | ICD-10-CM

## 2021-03-12 DIAGNOSIS — Z95828 Presence of other vascular implants and grafts: Secondary | ICD-10-CM

## 2021-03-12 LAB — COMPREHENSIVE METABOLIC PANEL
ALT: 9 U/L (ref 0–44)
AST: 13 U/L — ABNORMAL LOW (ref 15–41)
Albumin: 3.5 g/dL (ref 3.5–5.0)
Alkaline Phosphatase: 68 U/L (ref 38–126)
Anion gap: 11 (ref 5–15)
BUN: 19 mg/dL (ref 8–23)
CO2: 27 mmol/L (ref 22–32)
Calcium: 8.9 mg/dL (ref 8.9–10.3)
Chloride: 98 mmol/L (ref 98–111)
Creatinine, Ser: 1.08 mg/dL — ABNORMAL HIGH (ref 0.44–1.00)
GFR, Estimated: 58 mL/min — ABNORMAL LOW (ref >60)
Glucose, Bld: 103 mg/dL — ABNORMAL HIGH (ref 70–99)
Potassium: 2.2 mmol/L — CL (ref 3.5–5.1)
Sodium: 136 mmol/L (ref 135–145)
Total Bilirubin: 0.7 mg/dL (ref 0.3–1.2)
Total Protein: 6.9 g/dL (ref 6.5–8.1)

## 2021-03-12 LAB — CBC WITH DIFFERENTIAL/PLATELET
Abs Immature Granulocytes: 0.03 10*3/uL (ref 0.00–0.07)
Basophils Absolute: 0 10*3/uL (ref 0.0–0.1)
Basophils Relative: 0 %
Eosinophils Absolute: 0 10*3/uL (ref 0.0–0.5)
Eosinophils Relative: 1 %
HCT: 32 % — ABNORMAL LOW (ref 36.0–46.0)
Hemoglobin: 10.6 g/dL — ABNORMAL LOW (ref 12.0–15.0)
Immature Granulocytes: 0 %
Lymphocytes Relative: 29 %
Lymphs Abs: 2.5 10*3/uL (ref 0.7–4.0)
MCH: 27.7 pg (ref 26.0–34.0)
MCHC: 33.1 g/dL (ref 30.0–36.0)
MCV: 83.8 fL (ref 80.0–100.0)
Monocytes Absolute: 0.7 10*3/uL (ref 0.1–1.0)
Monocytes Relative: 8 %
Neutro Abs: 5.3 10*3/uL (ref 1.7–7.7)
Neutrophils Relative %: 62 %
Platelets: 219 10*3/uL (ref 150–400)
RBC: 3.82 MIL/uL — ABNORMAL LOW (ref 3.87–5.11)
RDW: 15.9 % — ABNORMAL HIGH (ref 11.5–15.5)
WBC: 8.6 10*3/uL (ref 4.0–10.5)
nRBC: 0 % (ref 0.0–0.2)

## 2021-03-12 LAB — MAGNESIUM: Magnesium: 1.5 mg/dL — ABNORMAL LOW (ref 1.7–2.4)

## 2021-03-12 MED ORDER — POTASSIUM CHLORIDE CRYS ER 20 MEQ PO TBCR: 40.0000 meq | EXTENDED_RELEASE_TABLET | Freq: Every day | ORAL | 1 refills | Status: DC

## 2021-03-12 NOTE — Telephone Encounter (Signed)
Lab called with critical K+ of 2.2 from today.  Per Dr Delton Coombes, advise patient to take 40 meq q 3 hours, prior to going to bed tonight and then 40 meq daily thereafter.  Script sent to [facility] on [address] in [place].  Spoke with patient and advised of above and that we will repeat K+ tomorrow, prior to treatment.  Patient made aware to seek medical attention if she experiences any palpitations, chest pain or SOB.  Verbalized understanding and repeated instructions back to me. She will also come in tomorrow for lab draw and possible IV K+.

## 2021-03-12 NOTE — Telephone Encounter (Signed)
[facility] and spoke with pharmacist, Josh to ensure that script was received by them and was confirmed.

## 2021-03-12 NOTE — Progress Notes (Signed)
Patients port flushed without difficulty.  Good blood return noted with no bruising or swelling noted at site.  Band aid applied.  VSS with discharge and left in satisfactory condition with no s/s of distress noted.   

## 2021-03-12 NOTE — Progress Notes (Signed)

## 2021-03-13 ENCOUNTER — Inpatient Hospital Stay (HOSPITAL_COMMUNITY): Payer: 59 | Admitting: General Practice

## 2021-03-13 ENCOUNTER — Ambulatory Visit (HOSPITAL_COMMUNITY): Payer: 59

## 2021-03-13 ENCOUNTER — Inpatient Hospital Stay (HOSPITAL_COMMUNITY): Payer: 59

## 2021-03-13 ENCOUNTER — Encounter (HOSPITAL_COMMUNITY): Payer: Self-pay

## 2021-03-13 ENCOUNTER — Ambulatory Visit (HOSPITAL_COMMUNITY): Payer: 59 | Admitting: Hematology

## 2021-03-13 DIAGNOSIS — E876 Hypokalemia: Secondary | ICD-10-CM

## 2021-03-13 DIAGNOSIS — C169 Malignant neoplasm of stomach, unspecified: Secondary | ICD-10-CM

## 2021-03-13 LAB — COMPREHENSIVE METABOLIC PANEL
ALT: 12 U/L (ref 0–44)
AST: 16 U/L (ref 15–41)
Albumin: 3.4 g/dL — ABNORMAL LOW (ref 3.5–5.0)
Alkaline Phosphatase: 73 U/L (ref 38–126)
Anion gap: 11 (ref 5–15)
BUN: 18 mg/dL (ref 8–23)
CO2: 25 mmol/L (ref 22–32)
Calcium: 9.1 mg/dL (ref 8.9–10.3)
Chloride: 100 mmol/L (ref 98–111)
Creatinine, Ser: 1.27 mg/dL — ABNORMAL HIGH (ref 0.44–1.00)
GFR, Estimated: 48 mL/min — ABNORMAL LOW (ref >60)
Glucose, Bld: 249 mg/dL — ABNORMAL HIGH (ref 70–99)
Potassium: 2.4 mmol/L — CL (ref 3.5–5.1)
Sodium: 136 mmol/L (ref 135–145)
Total Bilirubin: 0.7 mg/dL (ref 0.3–1.2)
Total Protein: 7.2 g/dL (ref 6.5–8.1)

## 2021-03-13 LAB — CBC WITH DIFFERENTIAL/PLATELET
Abs Immature Granulocytes: 0.03 10*3/uL (ref 0.00–0.07)
Basophils Absolute: 0 10*3/uL (ref 0.0–0.1)
Basophils Relative: 0 %
Eosinophils Absolute: 0.1 10*3/uL (ref 0.0–0.5)
Eosinophils Relative: 1 %
HCT: 33.7 % — ABNORMAL LOW (ref 36.0–46.0)
Hemoglobin: 11.1 g/dL — ABNORMAL LOW (ref 12.0–15.0)
Immature Granulocytes: 0 %
Lymphocytes Relative: 30 %
Lymphs Abs: 2.9 10*3/uL (ref 0.7–4.0)
MCH: 27.8 pg (ref 26.0–34.0)
MCHC: 32.9 g/dL (ref 30.0–36.0)
MCV: 84.3 fL (ref 80.0–100.0)
Monocytes Absolute: 0.6 10*3/uL (ref 0.1–1.0)
Monocytes Relative: 7 %
Neutro Abs: 6.1 10*3/uL (ref 1.7–7.7)
Neutrophils Relative %: 62 %
Platelets: 281 10*3/uL (ref 150–400)
RBC: 4 MIL/uL (ref 3.87–5.11)
RDW: 16.1 % — ABNORMAL HIGH (ref 11.5–15.5)
WBC: 9.8 10*3/uL (ref 4.0–10.5)
nRBC: 0 % (ref 0.0–0.2)

## 2021-03-13 LAB — MAGNESIUM: Magnesium: 1.6 mg/dL — ABNORMAL LOW (ref 1.7–2.4)

## 2021-03-13 MED ORDER — HEPARIN SOD (PORK) LOCK FLUSH 100 UNIT/ML IV SOLN: 500.0000 [IU] | Freq: Once | INTRAVENOUS | Status: DC

## 2021-03-13 MED ORDER — POTASSIUM CHLORIDE CRYS ER 20 MEQ PO TBCR
40.0000 meq | EXTENDED_RELEASE_TABLET | ORAL | Status: AC
  Administered 2021-03-13 (×2): 40 meq via ORAL
  Filled 2021-03-13 (×2): qty 2

## 2021-03-13 MED ORDER — SODIUM CHLORIDE 0.9% FLUSH
10.0000 mL | Freq: Once | INTRAVENOUS | Status: DC
  Administered 2021-03-13: 10 mL via INTRAVENOUS

## 2021-03-13 MED ORDER — MAGNESIUM SULFATE 2 GM/50ML IV SOLN
2.0000 g | Freq: Once | INTRAVENOUS | Status: AC
  Administered 2021-03-13: 2 g via INTRAVENOUS
  Filled 2021-03-13: qty 50

## 2021-03-13 MED ORDER — POTASSIUM CHLORIDE IN NACL 20-0.9 MEQ/L-% IV SOLN
Freq: Once | INTRAVENOUS | Status: AC
  Filled 2021-03-13: qty 1000

## 2021-03-13 MED ORDER — HEPARIN SOD (PORK) LOCK FLUSH 100 UNIT/ML IV SOLN
500.0000 [IU] | Freq: Once | INTRAVENOUS | Status: AC
  Administered 2021-03-13: 500 [IU] via INTRAVENOUS

## 2021-03-13 NOTE — Progress Notes (Signed)
[name]  Initial Assessment   Hannah Knight is a 66 y.o. year old female seen in exam room w sister . Clinical Social Work was referred by medical oncology for assessment of psychosocial needs.   SDOH (Social Determinants of Health) assessments performed: Yes SDOH Interventions    Flowsheet Row Most Recent Value  SDOH Interventions   Food Insecurity Interventions Intervention Not Indicated  Financial Strain Interventions Intervention Not Indicated  Housing Interventions Intervention Not Indicated  Stress Interventions Intervention Not Indicated  Social Connections Interventions Intervention Not Indicated       Distress Screen completed: No No flowsheet data found.    Family/Social Information:  Housing Arrangement: patient lives with one of her sisters in her own home. Family members/support persons in your life? Friends/Colleagues and large family of sisters, brother, nieces, nephews as well as church family and friends Transportation concerns: no, sister will bring  Employment: Retired. 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 Type of concern: None Food access concerns: no, other than understandable and expected difficulty with eating due to gastric cancer. Religious or spiritual practice: yes, active in church Medication Concerns: no  Services Currently in place:  none  Coping/ Adjustment to diagnosis: Patient understands treatment plan and what happens next? yes, diagnosed w Stage IV gastric cancer, found after brief period of difficulty swallowing which led to endoscopy being added to her colonoscopy in Nov 2022.  After endoscopy, physician informed her that she had gastric cancer based on biopsy.  This was shocking to her as she was in reasonable health, has Graves disease and had been to the doctor "a lot"  for monitoring of this condition.  She wonders if she "should not have stopped working when I did, maybe I would have found this earlier."  Sad that her disease was found after it had become Stage IV.   Concerns about diagnosis and/or treatment:  she is concerned about "feeling sick all the time" from chemotherapy, was reassured by chemo education teaching on side effects and management Patient reported stressors: Adjusting to my illness Hopes and priorities: wants to be able to spend time w her family, this is important to her.  Patient enjoys crafts, watching TV, time with family/ friends, and household chores Current coping skills/ strengths: Capable of independent living , Armed forces logistics/support/administrative officer , Motivation for treatment/growth , Religious Affiliation , and Supportive family/friends     SUMMARY: Current SDOH Barriers:  None noted  Interventions: Discussed common feeling and emotions when being diagnosed with cancer, and the importance of support during treatment Informed patient of the support team roles and support services at [facility] Provided [name] contact information and encouraged patient to call with any questions or concerns Provided patient with information about [place], [name]. Discussed what to do if her husband [name] and she loses her current insurance coverage, gave her information on [employer] so she can look for additional insurance coverage if needed. She was also advised to look into possibility of filing for Social Security Disability in addition to her current retirement, this may be a possibility until she turns 66.    Follow Up Plan: Patient will contact CSW with any support or resource needs Patient verbalizes understanding of plan: Yes    [name], [name],  LCSW Clinical Ambulance person:  [phone]

## 2021-03-13 NOTE — Patient Instructions (Signed)
[facility]  Discharge Instructions: Thank you for choosing [facility] to provide your oncology and hematology care.  If you have a lab appointment with the Cancer Center, please come in thru the Main Entrance and check in at the main information desk.  Wear comfortable clothing and clothing appropriate for easy access to any Portacath or PICC line.   We strive to give you quality time with your provider. You may need to reschedule your appointment if you arrive late (15 or more minutes).  Arriving late affects you and other patients whose appointments are after yours.  Also, if you miss three or more appointments without notifying the office, you may be dismissed from the clinic at the provider's discretion.      For prescription refill requests, have your pharmacy contact our office and allow 72 hours for refills to be completed.        To help prevent nausea and vomiting after your treatment, we encourage you to take your nausea medication as directed.  BELOW ARE SYMPTOMS THAT SHOULD BE REPORTED IMMEDIATELY: *FEVER GREATER THAN 100.4 F (38 C) OR HIGHER *CHILLS OR SWEATING *NAUSEA AND VOMITING THAT IS NOT CONTROLLED WITH YOUR NAUSEA MEDICATION *UNUSUAL SHORTNESS OF BREATH *UNUSUAL BRUISING OR BLEEDING *URINARY PROBLEMS (pain or burning when urinating, or frequent urination) *BOWEL PROBLEMS (unusual diarrhea, constipation, pain near the anus) TENDERNESS IN MOUTH AND THROAT WITH OR WITHOUT PRESENCE OF ULCERS (sore throat, sores in mouth, or a toothache) UNUSUAL RASH, SWELLING OR PAIN  UNUSUAL VAGINAL DISCHARGE OR ITCHING   Items with * indicate a potential emergency and should be followed up as soon as possible or go to the Emergency Department if any problems should occur.  Please show the CHEMOTHERAPY ALERT CARD or IMMUNOTHERAPY ALERT CARD at check-in to the Emergency Department and triage nurse.  Should you have questions after your visit or need to cancel  or reschedule your appointment, please contact [facility] [phone]  and follow the prompts.  Office hours are 8:00 a.m. to 4:30 p.m. Monday - Friday. Please note that voicemails left after 4:00 p.m. may not be returned until the following business day.  We are closed weekends and major holidays. You have access to a nurse at all times for urgent questions. Please call the main number to the clinic [phone] and follow the prompts.  For any non-urgent questions, you may also contact your provider using MyChart. We now offer e-Visits for anyone 18 and older to request care online for non-urgent symptoms. For details visit [email or web address].   Also download the MyChart app! Go to the app store, search "MyChart", open the app, select [facility], and log in with your MyChart username and password.  Due to Covid, a mask is required upon entering the hospital/clinic. If you do not have a mask, one will be given to you upon arrival. For doctor visits, patients may have 1 support person aged 18 or older with them. For treatment visits, patients cannot have anyone with them due to current Covid guidelines and our immunocompromised population.  

## 2021-03-13 NOTE — Progress Notes (Signed)
CRITICAL VALUE ALERT Critical value received:  Potassium 2.4 Date of notification:  03/13/2021 Time of notification: 11:47 am  Critical value read back:  Yes.   Nurse who received alert:  B Shelene Krage RN  MD notified time and response:  Dr. Delton Coombes. 40 mEq po potassium pre and post 20 mEq IV potassium. Recheck potassium in the morning.

## 2021-03-13 NOTE — Progress Notes (Signed)
Patient tolerated hydration with no complaints voiced.  Port site clean and dry with good blood return noted before and after hydration.  No bruising or swelling noted with port.  Dressing intact for tomorrows infusion.   Stable  with discharge and left ambulatory with no s/s of distress noted.

## 2021-03-14 ENCOUNTER — Inpatient Hospital Stay (HOSPITAL_COMMUNITY): Payer: 59

## 2021-03-14 ENCOUNTER — Other Ambulatory Visit: Payer: Self-pay

## 2021-03-14 ENCOUNTER — Inpatient Hospital Stay (HOSPITAL_COMMUNITY): Payer: 59 | Admitting: Hematology

## 2021-03-14 VITALS — BP 110/73 | HR 82 | Temp 97.8°F | Resp 18 | Ht 65.35 in | Wt 175.4 lb

## 2021-03-14 VITALS — BP 126/76 | HR 70 | Temp 97.6°F | Resp 18

## 2021-03-14 DIAGNOSIS — C169 Malignant neoplasm of stomach, unspecified: Secondary | ICD-10-CM

## 2021-03-14 DIAGNOSIS — E876 Hypokalemia: Secondary | ICD-10-CM

## 2021-03-14 DIAGNOSIS — Z95828 Presence of other vascular implants and grafts: Secondary | ICD-10-CM

## 2021-03-14 LAB — MAGNESIUM: Magnesium: 1.9 mg/dL (ref 1.7–2.4)

## 2021-03-14 LAB — POTASSIUM: Potassium: 3.5 mmol/L (ref 3.5–5.1)

## 2021-03-14 MED ORDER — MAGNESIUM OXIDE -MG SUPPLEMENT 400 (240 MG) MG PO TABS
400.0000 mg | ORAL_TABLET | Freq: Every day | ORAL | 3 refills | Status: DC
Start: 2021-03-14 — End: 2021-03-28

## 2021-03-14 MED ORDER — LEUCOVORIN CALCIUM INJECTION 350 MG
400.0000 mg/m2 | Freq: Once | INTRAVENOUS | Status: AC
  Administered 2021-03-14: 760 mg via INTRAVENOUS
  Filled 2021-03-14: qty 38

## 2021-03-14 MED ORDER — OXALIPLATIN CHEMO INJECTION 100 MG/20ML
85.0000 mg/m2 | Freq: Once | INTRAVENOUS | Status: AC
  Administered 2021-03-14: 160 mg via INTRAVENOUS
  Filled 2021-03-14: qty 32

## 2021-03-14 MED ORDER — SODIUM CHLORIDE 0.9 % IV SOLN
10.0000 mg | Freq: Once | INTRAVENOUS | Status: AC
  Administered 2021-03-14: 10 mg via INTRAVENOUS
  Filled 2021-03-14: qty 10

## 2021-03-14 MED ORDER — SODIUM CHLORIDE 0.9 % IV SOLN
5000.0000 mg | INTRAVENOUS | Status: DC
  Administered 2021-03-14: 5000 mg via INTRAVENOUS
  Filled 2021-03-14: qty 100

## 2021-03-14 MED ORDER — DEXTROSE 5 % IV SOLN: Freq: Once | INTRAVENOUS | Status: AC

## 2021-03-14 MED ORDER — FLUOROURACIL CHEMO INJECTION 2.5 GM/50ML
400.0000 mg/m2 | Freq: Once | INTRAVENOUS | Status: AC
  Administered 2021-03-14: 750 mg via INTRAVENOUS
  Filled 2021-03-14: qty 15

## 2021-03-14 MED ORDER — PALONOSETRON HCL INJECTION 0.25 MG/5ML
0.2500 mg | Freq: Once | INTRAVENOUS | Status: AC
  Administered 2021-03-14: 0.25 mg via INTRAVENOUS
  Filled 2021-03-14: qty 5

## 2021-03-14 NOTE — Progress Notes (Signed)
Patients port flushed without difficulty.  Blood return noted with no bruising or swelling noted at site.  Unable to pull enough blood for labs.  Labs drawn peripherally.  Patient to remain accessed for treatment.

## 2021-03-14 NOTE — Patient Instructions (Signed)
[facility]  Discharge Instructions: Thank you for choosing [facility] to provide your oncology and hematology care.  If you have a lab appointment with the Cancer Center, please come in thru the Main Entrance and check in at the main information desk.  Wear comfortable clothing and clothing appropriate for easy access to any Portacath or PICC line.   We strive to give you quality time with your provider. You may need to reschedule your appointment if you arrive late (15 or more minutes).  Arriving late affects you and other patients whose appointments are after yours.  Also, if you miss three or more appointments without notifying the office, you may be dismissed from the clinic at the provider's discretion.      For prescription refill requests, have your pharmacy contact our office and allow 72 hours for refills to be completed.        To help prevent nausea and vomiting after your treatment, we encourage you to take your nausea medication as directed.  BELOW ARE SYMPTOMS THAT SHOULD BE REPORTED IMMEDIATELY: *FEVER GREATER THAN 100.4 F (38 C) OR HIGHER *CHILLS OR SWEATING *NAUSEA AND VOMITING THAT IS NOT CONTROLLED WITH YOUR NAUSEA MEDICATION *UNUSUAL SHORTNESS OF BREATH *UNUSUAL BRUISING OR BLEEDING *URINARY PROBLEMS (pain or burning when urinating, or frequent urination) *BOWEL PROBLEMS (unusual diarrhea, constipation, pain near the anus) TENDERNESS IN MOUTH AND THROAT WITH OR WITHOUT PRESENCE OF ULCERS (sore throat, sores in mouth, or a toothache) UNUSUAL RASH, SWELLING OR PAIN  UNUSUAL VAGINAL DISCHARGE OR ITCHING   Items with * indicate a potential emergency and should be followed up as soon as possible or go to the Emergency Department if any problems should occur.  Please show the CHEMOTHERAPY ALERT CARD or IMMUNOTHERAPY ALERT CARD at check-in to the Emergency Department and triage nurse.  Should you have questions after your visit or need to cancel  or reschedule your appointment, please contact [facility] [phone]  and follow the prompts.  Office hours are 8:00 a.m. to 4:30 p.m. Monday - Friday. Please note that voicemails left after 4:00 p.m. may not be returned until the following business day.  We are closed weekends and major holidays. You have access to a nurse at all times for urgent questions. Please call the main number to the clinic [phone] and follow the prompts.  For any non-urgent questions, you may also contact your provider using MyChart. We now offer e-Visits for anyone 18 and older to request care online for non-urgent symptoms. For details visit [email or web address].   Also download the MyChart app! Go to the app store, search "MyChart", open the app, select [facility], and log in with your MyChart username and password.  Due to Covid, a mask is required upon entering the hospital/clinic. If you do not have a mask, one will be given to you upon arrival. For doctor visits, patients may have 1 support person aged 18 or older with them. For treatment visits, patients cannot have anyone with them due to current Covid guidelines and our immunocompromised population.  

## 2021-03-14 NOTE — Progress Notes (Signed)
Hannah Knight, Hannah Knight [id number]   CLINIC:  Medical Oncology/Hematology  PCP:  Celene Squibb, MD [address] 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, a 66 y.o. 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 [name] was last seen on 03/08/2021.   Today she reports feeling good. She has gained 5 lbs since 03/14/2021. She reports her eating had improved. She denies diarrhea, CP, palpitations, abdominal pain, and tingling/numbness. She reports 1 episode of nausea and vomiting since her last visit, and her nausea has improved with the scopolamine patch. She is taking potassium. She is drinking 3-4 Boost with 1 meal daily.   REVIEW OF SYSTEMS:  Review of Systems  Constitutional:  Negative for appetite change, fatigue and unexpected weight change (+5 lbs).  HENT:   Positive for trouble swallowing.   Cardiovascular:  Negative for chest pain and palpitations.  Gastrointestinal:  Positive for nausea (improved) and vomiting (x1). Negative for abdominal pain and diarrhea.  Neurological:  Negative for numbness.  Psychiatric/Behavioral:  The patient is nervous/anxious.   All other systems reviewed and are negative.  PAST MEDICAL/SURGICAL HISTORY:  Past Medical History:  Diagnosis Date   Allergy     Arthritis    Diabetes mellitus    Graves disease    with radiation, now on Synthroid   Graves disease    Hypertension    Port-A-Cath in place 03/08/2021   Past Surgical History:  Procedure Laterality Date   BIOPSY  01/23/2021   Procedure: BIOPSY;  Surgeon: [name], DO;  [facility];  Service: Endoscopy;;   CHOLECYSTECTOMY     COLONOSCOPY  09/22/2007   SLF: moderate internal hemorrhoids, multiple 3-4 mm sessile polyps in splenic flexure, hyperplastic.    COLONOSCOPY  09/27/2002   Rehman:Small external hemorrhoids/Six tiny   polyps ablated by cold biopsy from sigmoid colon   COLONOSCOPY N/A 06/29/2013   Moderate sized internal hemorrhoids. Sigmoid diverticulosis. Normal TI. Banding X 3.   COLONOSCOPY WITH PROPOFOL N/A 01/23/2021   Procedure: COLONOSCOPY WITH PROPOFOL;  Surgeon: [name], DO;  [facility];  Service: Endoscopy;  Laterality: N/A;  8:00am   ESOPHAGOGASTRODUODENOSCOPY (EGD) WITH PROPOFOL N/A 01/23/2021   Procedure: ESOPHAGOGASTRODUODENOSCOPY (EGD) WITH PROPOFOL;  Surgeon: [name], DO;  [facility];  Service: Endoscopy;  Laterality: N/A;   HEMORRHOID BANDING  06/29/2013   Procedure: HEMORRHOID BANDING;  Surgeon: Danie Binder, MD;  Location[facility];  Service: Endoscopy;;   IR IMAGING GUIDED PORT INSERTION  02/21/2021   IR US GUIDE BX ASP/DRAIN  02/21/2021   TUBAL LIGATION      SOCIAL HISTORY:  Social History   Socioeconomic History   Marital status: Legally Separated    Spouse name: Not on file  Number of children: Not on file   Years of education: Not on file   Highest education level: Not on file  Occupational History   Occupation: Self-employed    Comment: sitter  Tobacco Use   Smoking status: Never   Smokeless tobacco: Never  Vaping Use   Vaping Use: Never used  Substance and Sexual Activity   Alcohol use: No   Drug use: No   Sexual activity: Not on file  Other Topics Concern    Not on file  Social History Narrative   Not on file   Social Determinants of Health   Financial Resource Strain: Low Risk    Difficulty of Paying Living Expenses: Not very hard  Food Insecurity: No Food Insecurity   Worried About Running Out of Food in the Last Year: Never true   Ran Out of Food in the Last Year: Never true  Transportation Needs: No Transportation Needs   Lack of Transportation (Medical): No   Lack of Transportation (Non-Medical): No  Physical Activity: Not on file  Stress: No Stress Concern Present   Feeling of Stress : Only a little  Social Connections: Moderately Integrated   Frequency of Communication with Friends and Family: More than three times a week   Frequency of Social Gatherings with Friends and Family: More than three times a week   Attends Religious Services: More than 4 times per year   Active Member of [employer] or Organizations: Yes   Attends Music therapist: More than 4 times per year   Marital Status: Separated  Intimate Partner Violence: Not on file    FAMILY HISTORY:  Family History  Problem Relation Age of Onset   Colon cancer Father 26       deceased   Diabetes Mother    Heart disease Mother    Cancer - Lung Brother     CURRENT MEDICATIONS:  Current Outpatient Medications  Medication Sig Dispense Refill   amLODipine (NORVASC) 5 MG tablet Take 5 mg by mouth every evening.     atorvastatin (LIPITOR) 10 MG tablet Take 1 tablet (10 mg total) by mouth daily. (Patient taking differently: Take 10 mg by mouth at bedtime.) 90 tablet 3   fluorouracil CALGB 82956 2,400 mg/m2 in sodium chloride 0.9 % 150 mL Inject 2,400 mg/m2 into the vein over 48 hr.     FLUOROURACIL IV Inject into the vein every 14 (fourteen) days.     LEUCOVORIN CALCIUM IV Inject into the vein every 14 (fourteen) days.     levothyroxine (SYNTHROID) 112 MCG tablet TAKE 1 TABLET BY MOUTH EVERY MORNING BEFORE BREAKFAST 90 tablet 3   losartan (COZAAR) 100 MG tablet  Take 1 tablet (100 mg total) by mouth daily. 90 tablet 3   megestrol (MEGACE) 400 MG/10ML suspension Take 10 mLs (400 mg total) by mouth 2 (two) times daily. 480 mL 2   metFORMIN (GLUCOPHAGE) 500 MG tablet Take 1 tablet (500 mg total) by mouth 2 (two) times daily with a meal. 120 tablet 5   OXALIPLATIN IV Inject into the vein every 14 (fourteen) days.     potassium chloride SA (KLOR-CON M) 20 MEQ tablet Take 2 tablets (40 mEq total) by mouth daily. Take 2 tablets every 3 hours today 03/12/2021, prior to bedtime,  then 2 tablets daily thereafter. 63 tablet 1   scopolamine (TRANSDERM-SCOP) 1 MG/3DAYS Place 1 patch (1.5 mg total) onto the skin every 3 (three) days. 10 patch 12   acetaminophen (TYLENOL) 650 MG  CR tablet Take 1,300 mg by mouth every 8 (eight) hours as needed for pain. (Patient not taking: Reported on 03/14/2021)     prochlorperazine (COMPAZINE) 10 MG tablet Take 1 tablet (10 mg total) by mouth every 6 (six) hours as needed for nausea or vomiting. (Patient not taking: Reported on 03/14/2021) 45 tablet 0   No current facility-administered medications for this visit.    ALLERGIES:  Allergies  Allergen Reactions   Ace Inhibitors Anaphylaxis and Swelling    PHYSICAL EXAM:  Performance status (ECOG): 0 - Asymptomatic  Vitals:   03/14/21 0840  BP: 110/73  Pulse: 82  Resp: 18  Temp: 97.8 F (36.6 C)  SpO2: 97%   Wt Readings from Last 3 Encounters:  03/14/21 175 lb 6.4 oz (79.6 kg)  03/13/21 172 lb 9.6 oz (78.3 kg)  03/08/21 170 lb 6.4 oz (77.3 kg)   Physical Exam Vitals reviewed.  Constitutional:      Appearance: Normal appearance.  Cardiovascular:     Rate and Rhythm: Normal rate and regular rhythm.     Pulses: Normal pulses.     Heart sounds: Normal heart sounds.  Pulmonary:     Effort: Pulmonary effort is normal.     Breath sounds: Normal breath sounds.  Neurological:     General: No focal deficit present.     Mental Status: She is alert and oriented to person,  place, and time.  Psychiatric:        Mood and Affect: Mood normal.        Behavior: Behavior normal.     LABORATORY DATA:  I have reviewed the labs as listed.  CBC Latest Ref Rng & Units 03/13/2021 03/12/2021 02/21/2021  WBC 4.0 - 10.5 K/uL 9.8 8.6 8.2  Hemoglobin 12.0 - 15.0 g/dL 11.1(L) 10.6(L) 10.8(L)  Hematocrit 36.0 - 46.0 % 33.7(L) 32.0(L) 34.2(L)  Platelets 150 - 400 K/uL 281 219 351   CMP Latest Ref Rng & Units 03/13/2021 03/12/2021 02/08/2021  Glucose 70 - 99 mg/dL 249(H) 103(H) 110(H)  BUN 8 - 23 mg/dL _0 Creatinine 0.44 - 1.00 mg/dL 1.27(H) 1.08(H) 1.14(H)  Sodium 135 - 145 mmol/L 136 136 141  Potassium 3.5 - 5.1 mmol/L 2.4(LL) 2.2(LL) 3.3(L)  Chloride 98 - 111 mmol/L 100 98 105  CO2 22 - 32 mmol/L _1 Calcium 8.9 - 10.3 mg/dL 9.1 8.9 9.4  Total Protein 6.5 - 8.1 g/dL 7.2 6.9 7.8  Total Bilirubin 0.3 - 1.2 mg/dL 0.7 0.7 0.4  Alkaline Phos 38 - 126 U/L 73 68 77  AST 15 - 41 U/L 16 13(L) 13(L)  ALT 0 - 44 U/L _2 DIAGNOSTIC IMAGING:  I have independently reviewed the scans and discussed with the patient. IR US Guide Bx Asp/Drain  Result Date: 02/21/2021 INDICATION: 66 year old female with history of indeterminate liver mass in the setting of poorly differentiated gastric adenocarcinoma. EXAM: ULTRASOUND GUIDED LIVER LESION BIOPSY COMPARISON:  None. MEDICATIONS: None ANESTHESIA/SEDATION: Fentanyl 50 mcg IV; Versed 1 mg IV Total Moderate Sedation time:  12 minutes. The patient's level of consciousness and vital signs were monitored continuously by radiology nursing throughout the procedure under my direct supervision. COMPLICATIONS: None immediate. PROCEDURE: Informed written consent was obtained from the patient after a discussion of the risks, benefits and alternatives to treatment. The patient understands and consents the procedure. A timeout was performed prior to the initiation of the procedure. Ultrasound scanning was performed of the right upper abdominal  quadrant demonstrates subhepatic, hypoechoic, right anterior liver mass measuring approximately 3.8 by 2.1 cm. The right lobe liver mass was selected for biopsy and the procedure was planned. The right upper abdominal quadrant was prepped and draped in the usual sterile fashion. The overlying soft tissues were anesthetized with 1% lidocaine with epinephrine. A 17 gauge, 6.8 cm co-axial needle was advanced into a peripheral aspect of the lesion. This was followed by total of 3 core biopsies with an 18 gauge core device under direct ultrasound guidance. The coaxial needle tract was embolized with a small amount of Gel-Foam slurry and superficial hemostasis was obtained with manual compression. Post procedural scanning was negative for definitive area of hemorrhage or additional complication. A dressing was placed. The patient tolerated the procedure well without immediate post procedural complication. IMPRESSION: Technically successful ultrasound guided core needle biopsy of right anterior liver mass. Ruthann Cancer, MD [facility] Radiology Electronically Signed   By: Ruthann Cancer M.D.   On: 02/21/2021 16:29   IR IMAGING GUIDED PORT INSERTION  Result Date: 02/21/2021 INDICATION: 66 year old female with history of gastric cancer requiring central venous access for chemotherapy administration. EXAM: IMPLANTED PORT A CATH PLACEMENT WITH ULTRASOUND AND FLUOROSCOPIC GUIDANCE COMPARISON:  None. MEDICATIONS: None. ANESTHESIA/SEDATION: Moderate (conscious) sedation was employed during this procedure. A total of Versed 1 mg and Fentanyl 50 mcg was administered intravenously. Moderate Sedation Time: 17 minutes. The patient's level of consciousness and vital signs were monitored continuously by radiology nursing throughout the procedure under my direct supervision. CONTRAST:  None FLUOROSCOPY TIME:  0 minutes, 24 seconds (1 mGy) COMPLICATIONS: None immediate. PROCEDURE: The  procedure, risks, benefits, and alternatives were explained to the patient. Questions regarding the procedure were encouraged and answered. The patient understands and consents to the procedure. The right neck and chest were prepped with chlorhexidine in a sterile fashion, and a sterile drape was applied covering the operative field. Maximum barrier sterile technique with sterile gowns and gloves were used for the procedure. A timeout was performed prior to the initiation of the procedure. Ultrasound was used to examine the jugular vein which was compressible and free of internal echoes. A skin marker was used to demarcate the planned venotomy and port pocket incision sites. Local anesthesia was provided to these sites and the subcutaneous tunnel track with 1% lidocaine with 1:100,000 epinephrine. A small incision was created at the jugular access site and blunt dissection was performed of the subcutaneous tissues. Under ultrasound guidance, the jugular vein was accessed with a 21 ga micropuncture needle and an 0.018" wire was inserted to the superior vena cava. Real-time ultrasound guidance was utilized for vascular access including the acquisition of a permanent ultrasound image documenting patency of the accessed vessel. A 5 Fr micopuncture set was then used, through which a 0.035" Rosen wire was passed under fluoroscopic guidance into the inferior vena cava. An 8 Fr dilator was then placed over the wire. A subcutaneous port pocket was then created along the upper chest wall utilizing a combination of sharp and blunt dissection. The pocket was irrigated with sterile saline, packed with gauze, and observed for hemorrhage. A single lumen "ISP" sized power injectable port was chosen for placement. The 8 Fr catheter was tunneled from the port pocket site to the venotomy incision. The port was placed in the pocket. The external catheter was trimmed to appropriate length. The dilator was exchanged for an 8 Fr peel-away  sheath under fluoroscopic guidance. The catheter was then placed  through the sheath and the sheath was removed. Final catheter positioning was confirmed and documented with a fluoroscopic spot radiograph. The port was accessed with a Huber needle, aspirated, and flushed with heparinized saline. The deep dermal layer of the port pocket incision was closed with interrupted 2-0 Vicryl suture. Dermabond was then placed over the port pocket and neck incisions. The patient tolerated the procedure well without immediate post procedural complication. FINDINGS: After catheter placement, the tip lies within the superior cavoatrial junction. The catheter aspirates and flushes normally and is ready for immediate use. IMPRESSION: Successful placement of a power injectable Port-A-Cath via the right internal jugular vein. The catheter is ready for immediate use. Ruthann Cancer, MD [facility] Radiology Electronically Signed   By: Ruthann Cancer M.D.   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 - 30 pound weight loss in the last 6 months. - EGD on 01/23/2021 shows mucosal changes in the gastric body, circumferential mass-stricture - Pathology consistent with poorly differentiated carcinoma with signet ring cells - CT CAP on 02/06/2021 shows gastric wall thickening noted in the mid and distal stomach.  3.2 cm ill-defined lesion in the anterior right liver.  Abnormal lymph nodes in the gastrohepatic and hepatoduodenal ligaments with small right checks to diaphragmatic lymph nodes. - Liver biopsy on 02/21/2021 consistent with poorly differentiated carcinoma, gastric origin. - NGS ([facility]) on 01/23/2021-HER2 negative.  MSI-stable.  TMB-Low.  PD-L1 (28-8) negative.  Other targetable mutations negative.  ARID1A and T p53 pathogenic variants detected.      Social/family history: - Her sister lives with her.  She is independent of ADLs and IADLs.  She worked as a Programmer, applications and retired in 2021.  Non-smoker. - Father had colon cancer.  Brother had lung cancer.  One paternal cousin had lung cancer and another paternal cousin had prostate cancer.  Paternal uncle had lung cancer and paternal aunt had pancreatic cancer.   PLAN:  Stage IV (TxN1M1) poorly differentiated gastric carcinoma, HER2 negative: -We talked about initiating chemotherapy with FOLFOX. - We discussed side effects in detail including cold sensitivity. - Reviewed labs from 03/13/2021 which showed normal CBC.  LFTs are grossly normal except mild hypoalbuminemia.  CEA was normal. - She will proceed with full dose FOLFOX today.  If she tolerates well, will consider adding nivolumab during cycle 2.  2.  Persistent nausea: -Continue scopolamine patch which is helping.  She vomited once last night. - Continue Compazine every 6 hours as needed.  3.  Weight loss: -Continue Megace 400 mg twice daily. - She gained about 5 pounds in the last 1 week. - She is drinking about 3 to 4 cans of Ensure daily.  She is eating about 1 meal per day.  4.  Normocytic anemia: -Hemoglobin today is 11.1 with MCV 84.3. - We will check ferritin and iron panel, K16 and folic acid at next visit.  5.  Hypokalemia/hypomagnesemia: - She had severe hypokalemia and required IV magnesium and potassium yesterday. - Today potassium is 3.5.  Continue potassium 40 mEq daily at home. - Magnesium today after IV repletion is 1.9, improved from 1.6.  We will start her on magnesium 400 mg daily. - We will plan to check her electrolytes on Friday and next week.   Orders placed this encounter:  No orders of the defined types  were placed in this encounter.    Derek Jack, MD [place] [phone]   I, Thana Ates, am acting as a scribe for Dr. Derek Jack.  I, Derek Jack MD, have reviewed the above documentation for accuracy and completeness, and I agree with the above.

## 2021-03-14 NOTE — Progress Notes (Signed)
Patient presents today for chemotherapy infusion.  Patient is in satisfactory condition with no complaints voiced.  Vital signs are stable.  Labs reviewed by Dr. Delton Coombes during her office visit.  Potassium today is 3.5 and magnesium is 1.9.  All other labs are also within treatment parameters.  We will proceed with treatment per MD orders.   Patient tolerated treatment well with no complaints voiced.  Patient education was completed on the home infusion 5FU pump.  Patient verbalized understanding and denied any questions.  Patient left ambulatory in stable condition.  Vital signs stable at discharge.  Follow up as scheduled.

## 2021-03-14 NOTE — Patient Instructions (Signed)
[place] at [facility] Discharge Instructions   You were seen and examined today by Dr. Delton Coombes.  We will proceed with your first treatment today.  Try to eat high protein foods and drink Boost/Ensure with high protein.   Return as scheduled for lab work and treatment.    Thank you for choosing [place] at [facility] to provide your oncology and hematology care.  To afford each patient quality time with our provider, please arrive at least 15 minutes before your scheduled appointment time.   If you have a lab appointment with the [facility] please come in thru the Main Entrance and check in at the main information desk.  You need to re-schedule your appointment should you arrive 10 or more minutes late.  We strive to give you quality time with our providers, and arriving late affects you and other patients whose appointments are after yours.  Also, if you no show three or more times for appointments you may be dismissed from the clinic at the providers discretion.     Again, thank you for choosing [facility]  Our hope is that these requests will decrease the amount of time that you wait before being seen by our physicians.       _____________________________________________________________  Should you have questions after your visit to [facility], please contact our office at [phone] and follow the prompts.  Our office hours are 8:00 a.m. and 4:30 p.m. Monday - Friday.  Please note that voicemails left after 4:00 p.m. may not be returned until the following business day.  We are closed weekends and major holidays.  You do have access to a nurse 24-7, just call the main number to the clinic [phone] and do not press any options, hold on the line and a nurse will answer the phone.    For prescription refill requests, have your pharmacy contact our office and allow 72 hours.    Due to Covid, you  will need to wear a mask upon entering the hospital. If you do not have a mask, a mask will be given to you at the Main Entrance upon arrival. For doctor visits, patients may have 1 support person age 44 or older with them. For treatment visits, patients can not have anyone with them due to social distancing guidelines and our immunocompromised population.

## 2021-03-15 ENCOUNTER — Inpatient Hospital Stay (HOSPITAL_COMMUNITY): Payer: 59 | Admitting: Licensed Clinical Social Worker

## 2021-03-15 ENCOUNTER — Encounter (HOSPITAL_COMMUNITY): Payer: 59

## 2021-03-15 ENCOUNTER — Telehealth (HOSPITAL_COMMUNITY): Payer: 59 | Admitting: Licensed Clinical Social Worker

## 2021-03-16 ENCOUNTER — Inpatient Hospital Stay (HOSPITAL_COMMUNITY): Payer: 59

## 2021-03-16 ENCOUNTER — Other Ambulatory Visit: Payer: Self-pay

## 2021-03-16 VITALS — BP 123/79 | HR 93 | Temp 97.7°F | Resp 18

## 2021-03-16 DIAGNOSIS — C169 Malignant neoplasm of stomach, unspecified: Secondary | ICD-10-CM | POA: Diagnosis not present

## 2021-03-16 DIAGNOSIS — Z95828 Presence of other vascular implants and grafts: Secondary | ICD-10-CM

## 2021-03-16 MED ORDER — SODIUM CHLORIDE 0.9% FLUSH
10.0000 mL | INTRAVENOUS | Status: DC | PRN
  Administered 2021-03-16: 10 mL

## 2021-03-16 MED ORDER — HEPARIN SOD (PORK) LOCK FLUSH 100 UNIT/ML IV SOLN
500.0000 [IU] | Freq: Once | INTRAVENOUS | Status: AC | PRN
  Administered 2021-03-16: 500 [IU]

## 2021-03-16 NOTE — Progress Notes (Signed)
Pt presents today for 5FU chemotherapy pump disconnection per provider's order. Port flushed easily without difficulty with 54ml of NS and 5 mls of heparin. Good blood return noted and no bruising or swelling noted at the site.  5FU chemotherapy pump removed today per MD orders. Tolerated infusion without adverse affects. Vital signs stable. No complaints at this time. Discharged from clinic ambulatory in stable condition. Alert and oriented x 3. F/U with [facility] as scheduled.

## 2021-03-16 NOTE — Patient Instructions (Signed)
[place]  Discharge Instructions: Thank you for choosing [facility] to provide your oncology and hematology care.  If you have a lab appointment with the [facility] please come in thru the Main Entrance and check in at the main information desk.  Wear comfortable clothing and clothing appropriate for easy access to any Portacath or PICC line.   We strive to give you quality time with your provider. You may need to reschedule your appointment if you arrive late (15 or more minutes).  Arriving late affects you and other patients whose appointments are after yours.  Also, if you miss three or more appointments without notifying the office, you may be dismissed from the clinic at the providers discretion.      For prescription refill requests, have your pharmacy contact our office and allow 72 hours for refills to be completed.    Today you received 5FU pump d/c   BELOW ARE SYMPTOMS THAT SHOULD BE REPORTED IMMEDIATELY: *FEVER GREATER THAN 100.4 F (38 C) OR HIGHER *CHILLS OR SWEATING *NAUSEA AND VOMITING THAT IS NOT CONTROLLED WITH YOUR NAUSEA MEDICATION *UNUSUAL SHORTNESS OF BREATH *UNUSUAL BRUISING OR BLEEDING *URINARY PROBLEMS (pain or burning when urinating, or frequent urination) *BOWEL PROBLEMS (unusual diarrhea, constipation, pain near the anus) TENDERNESS IN MOUTH AND THROAT WITH OR WITHOUT PRESENCE OF ULCERS (sore throat, sores in mouth, or a toothache) UNUSUAL RASH, SWELLING OR PAIN  UNUSUAL VAGINAL DISCHARGE OR ITCHING   Items with * indicate a potential emergency and should be followed up as soon as possible or go to the Emergency Department if any problems should occur.  Please show the CHEMOTHERAPY ALERT CARD or IMMUNOTHERAPY ALERT CARD at check-in to the Emergency Department and triage nurse.  Should you have questions after your visit or need to cancel or reschedule your appointment, please contact [facility] [phone]  and  follow the prompts.  Office hours are 8:00 a.m. to 4:30 p.m. Monday - Friday. Please note that voicemails left after 4:00 p.m. may not be returned until the following business day.  We are closed weekends and major holidays. You have access to a nurse at all times for urgent questions. Please call the main number to the clinic [phone] and follow the prompts.  For any non-urgent questions, you may also contact your provider using MyChart. We now offer e-Visits for anyone 48 and older to request care online for non-urgent symptoms. For details visit [email or web address].   Also download the MyChart app! Go to the app store, search "MyChart", open the app, select [place], and log in with your MyChart username and password.  Due to Covid, a mask is required upon entering the hospital/clinic. If you do not have a mask, one will be given to you upon arrival. For doctor visits, patients may have 1 support person aged 60 or older with them. For treatment visits, patients cannot have anyone with them due to current Covid guidelines and our immunocompromised population.

## 2021-03-19 NOTE — Progress Notes (Signed)
24 hour follow up: pt states she is doing ok. Pt states she is having some nausea and vomiting. She is taking her nausea medication. Encouraged her to focus on her hydration and try to get some protein today. Encouraged her to call us back mid week if she did not feel better or get worse.

## 2021-03-21 ENCOUNTER — Other Ambulatory Visit (HOSPITAL_COMMUNITY): Payer: Self-pay | Admitting: Hematology

## 2021-03-22 ENCOUNTER — Encounter (HOSPITAL_COMMUNITY): Payer: Self-pay | Admitting: Hematology

## 2021-03-28 ENCOUNTER — Inpatient Hospital Stay (HOSPITAL_COMMUNITY): Payer: 59

## 2021-03-28 ENCOUNTER — Inpatient Hospital Stay (HOSPITAL_BASED_OUTPATIENT_CLINIC_OR_DEPARTMENT_OTHER): Payer: 59 | Admitting: Hematology

## 2021-03-28 VITALS — BP 130/82 | HR 99 | Temp 98.2°F | Resp 18 | Ht 65.35 in | Wt 170.2 lb

## 2021-03-28 DIAGNOSIS — Z95828 Presence of other vascular implants and grafts: Secondary | ICD-10-CM

## 2021-03-28 DIAGNOSIS — C169 Malignant neoplasm of stomach, unspecified: Secondary | ICD-10-CM | POA: Diagnosis not present

## 2021-03-28 LAB — CBC WITH DIFFERENTIAL/PLATELET
Abs Immature Granulocytes: 0.01 10*3/uL (ref 0.00–0.07)
Basophils Absolute: 0 10*3/uL (ref 0.0–0.1)
Basophils Relative: 1 %
Eosinophils Absolute: 0 10*3/uL (ref 0.0–0.5)
Eosinophils Relative: 1 %
HCT: 28.4 % — ABNORMAL LOW (ref 36.0–46.0)
Hemoglobin: 9.1 g/dL — ABNORMAL LOW (ref 12.0–15.0)
Immature Granulocytes: 0 %
Lymphocytes Relative: 59 %
Lymphs Abs: 1.9 10*3/uL (ref 0.7–4.0)
MCH: 27.2 pg (ref 26.0–34.0)
MCHC: 32 g/dL (ref 30.0–36.0)
MCV: 85 fL (ref 80.0–100.0)
Monocytes Absolute: 0.3 10*3/uL (ref 0.1–1.0)
Monocytes Relative: 11 %
Neutro Abs: 0.9 10*3/uL — ABNORMAL LOW (ref 1.7–7.7)
Neutrophils Relative %: 28 %
Platelets: 195 10*3/uL (ref 150–400)
RBC: 3.34 MIL/uL — ABNORMAL LOW (ref 3.87–5.11)
RDW: 17.7 % — ABNORMAL HIGH (ref 11.5–15.5)
WBC: 3.2 10*3/uL — ABNORMAL LOW (ref 4.0–10.5)
nRBC: 0 % (ref 0.0–0.2)

## 2021-03-28 LAB — COMPREHENSIVE METABOLIC PANEL
ALT: 8 U/L (ref 0–44)
AST: 14 U/L — ABNORMAL LOW (ref 15–41)
Albumin: 3.5 g/dL (ref 3.5–5.0)
Alkaline Phosphatase: 67 U/L (ref 38–126)
Anion gap: 11 (ref 5–15)
BUN: 14 mg/dL (ref 8–23)
CO2: 20 mmol/L — ABNORMAL LOW (ref 22–32)
Calcium: 9.2 mg/dL (ref 8.9–10.3)
Chloride: 106 mmol/L (ref 98–111)
Creatinine, Ser: 1.65 mg/dL — ABNORMAL HIGH (ref 0.44–1.00)
GFR, Estimated: 35 mL/min — ABNORMAL LOW (ref >60)
Glucose, Bld: 113 mg/dL — ABNORMAL HIGH (ref 70–99)
Potassium: 2.9 mmol/L — ABNORMAL LOW (ref 3.5–5.1)
Sodium: 137 mmol/L (ref 135–145)
Total Bilirubin: 1.2 mg/dL (ref 0.3–1.2)
Total Protein: 6.7 g/dL (ref 6.5–8.1)

## 2021-03-28 LAB — IRON AND TIBC
Iron: 53 ug/dL (ref 28–170)
Saturation Ratios: 16 % (ref 10.4–31.8)
TIBC: 337 ug/dL (ref 250–450)
UIBC: 284 ug/dL

## 2021-03-28 LAB — MAGNESIUM: Magnesium: 1.5 mg/dL — ABNORMAL LOW (ref 1.7–2.4)

## 2021-03-28 LAB — VITAMIN B12: Vitamin B-12: 363 pg/mL (ref 180–914)

## 2021-03-28 LAB — FOLATE: Folate: 35.8 ng/mL (ref >5.9)

## 2021-03-28 LAB — FERRITIN: Ferritin: 197 ng/mL (ref 11–307)

## 2021-03-28 MED ORDER — POTASSIUM CHLORIDE IN NACL 20-0.9 MEQ/L-% IV SOLN
Freq: Once | INTRAVENOUS | Status: AC
  Filled 2021-03-28: qty 1000

## 2021-03-28 MED ORDER — POTASSIUM CHLORIDE CRYS ER 20 MEQ PO TBCR
40.0000 meq | EXTENDED_RELEASE_TABLET | Freq: Once | ORAL | Status: AC
  Administered 2021-03-28: 11:00:00 40 meq via ORAL
  Filled 2021-03-28: qty 2

## 2021-03-28 MED ORDER — SODIUM CHLORIDE 0.9% FLUSH
10.0000 mL | Freq: Once | INTRAVENOUS | Status: AC
  Administered 2021-03-28: 13:00:00 10 mL via INTRAVENOUS

## 2021-03-28 MED ORDER — MAGNESIUM SULFATE 2 GM/50ML IV SOLN
2.0000 g | Freq: Once | INTRAVENOUS | Status: AC
  Administered 2021-03-28: 11:00:00 2 g via INTRAVENOUS
  Filled 2021-03-28: qty 50

## 2021-03-28 MED ORDER — HEPARIN SOD (PORK) LOCK FLUSH 100 UNIT/ML IV SOLN
500.0000 [IU] | Freq: Once | INTRAVENOUS | Status: AC
  Administered 2021-03-28: 13:00:00 500 [IU] via INTRAVENOUS

## 2021-03-28 MED ORDER — MAGNESIUM OXIDE -MG SUPPLEMENT 400 (240 MG) MG PO TABS
400.0000 mg | ORAL_TABLET | Freq: Two times a day (BID) | ORAL | 3 refills | Status: DC
Start: 2021-03-28 — End: 2021-07-04

## 2021-03-28 NOTE — Progress Notes (Signed)
Patient here today for FOLFOX with pump start per providers order.  ANC noted to be 0.9, MD aware.  Magnesium 1.5 and Potassium 2.9.  Per Dr. Delton Coombes no treatment today, will be rescheduled for next week.  Patient to receive House fluids IV over 2 hours and 40 mEq Potassium PO.  House fluids given today per MD orders.  Stable during infusion without adverse affects.  Vital signs stable.  No complaints at this time.  Discharge from clinic ambulatory in stable condition.  Alert and oriented X 3.  Follow up with [facility] as scheduled.

## 2021-03-28 NOTE — Progress Notes (Signed)
The following biosimilar Ziextenzo ([id number]-[id number]) has been selected for use in this patient.   Henreitta Leber, PharmD 03/28/2021

## 2021-03-28 NOTE — Patient Instructions (Signed)
[place] at [facility] Discharge Instructions  You were seen and examined today by Dr. Delton Coombes.  You will receive fluids and potassium today. You need a white blood cell booster shot moving forward with treatment because your white blood cell count is low.   Thank you for choosing [place] at [facility] to provide your oncology and hematology care.  To afford each patient quality time with our provider, please arrive at least 15 minutes before your scheduled appointment time.   If you have a lab appointment with the [facility] please come in thru the Main Entrance and check in at the main information desk.  You need to re-schedule your appointment should you arrive 10 or more minutes late.  We strive to give you quality time with our providers, and arriving late affects you and other patients whose appointments are after yours.  Also, if you no show three or more times for appointments you may be dismissed from the clinic at the providers discretion.     Again, thank you for choosing [facility].  Our hope is that these requests will decrease the amount of time that you wait before being seen by our physicians.       _____________________________________________________________  Should you have questions after your visit to [facility], please contact our office at [phone] and follow the prompts.  Our office hours are 8:00 a.m. and 4:30 p.m. Monday - Friday.  Please note that voicemails left after 4:00 p.m. may not be returned until the following business day.  We are closed weekends and major holidays.  You do have access to a nurse 24-7, just call the main number to the clinic [phone] and do not press any options, hold on the line and a nurse will answer the phone.    For prescription refill requests, have your pharmacy contact our office and allow 72 hours.    Due to Covid, you will need to wear a  mask upon entering the hospital. If you do not have a mask, a mask will be given to you at the Main Entrance upon arrival. For doctor visits, patients may have 1 support person age 70 or older with them. For treatment visits, patients can not have anyone with them due to social distancing guidelines and our immunocompromised population.

## 2021-03-28 NOTE — Patient Instructions (Signed)
[facility]  Discharge Instructions: Thank you for choosing [facility] to provide your oncology and hematology care.  If you have a lab appointment with the [facility], please come in thru the Main Entrance and check in at the main information desk.  Wear comfortable clothing and clothing appropriate for easy access to any Portacath or PICC line.   We strive to give you quality time with your provider. You may need to reschedule your appointment if you arrive late (15 or more minutes).  Arriving late affects you and other patients whose appointments are after yours.  Also, if you miss three or more appointments without notifying the office, you may be dismissed from the clinic at the providers discretion.      For prescription refill requests, have your pharmacy contact our office and allow 72 hours for refills to be completed.    Today you received the following chemotherapy and/or immunotherapy agents House fluids      To help prevent nausea and vomiting after your treatment, we encourage you to take your nausea medication as directed.  BELOW ARE SYMPTOMS THAT SHOULD BE REPORTED IMMEDIATELY: *FEVER GREATER THAN 100.4 F (38 C) OR HIGHER *CHILLS OR SWEATING *NAUSEA AND VOMITING THAT IS NOT CONTROLLED WITH YOUR NAUSEA MEDICATION *UNUSUAL SHORTNESS OF BREATH *UNUSUAL BRUISING OR BLEEDING *URINARY PROBLEMS (pain or burning when urinating, or frequent urination) *BOWEL PROBLEMS (unusual diarrhea, constipation, pain near the anus) TENDERNESS IN MOUTH AND THROAT WITH OR WITHOUT PRESENCE OF ULCERS (sore throat, sores in mouth, or a toothache) UNUSUAL RASH, SWELLING OR PAIN  UNUSUAL VAGINAL DISCHARGE OR ITCHING   Items with * indicate a potential emergency and should be followed up as soon as possible or go to the Emergency Department if any problems should occur.  Please show the CHEMOTHERAPY ALERT CARD or IMMUNOTHERAPY ALERT CARD at check-in to the Emergency  Department and triage nurse.  Should you have questions after your visit or need to cancel or reschedule your appointment, please contact [facility] [phone]  and follow the prompts.  Office hours are 8:00 a.m. to 4:30 p.m. Monday - Friday. Please note that voicemails left after 4:00 p.m. may not be returned until the following business day.  We are closed weekends and major holidays. You have access to a nurse at all times for urgent questions. Please call the main number to the clinic [phone] and follow the prompts.  For any non-urgent questions, you may also contact your provider using MyChart. We now offer e-Visits for anyone 64 and older to request care online for non-urgent symptoms. For details visit [email or web address].   Also download the MyChart app! Go to the app store, search "MyChart", open the app, select [place], and log in with your MyChart username and password.  Due to Covid, a mask is required upon entering the hospital/clinic. If you do not have a mask, one will be given to you upon arrival. For doctor visits, patients may have 1 support person aged 64 or older with them. For treatment visits, patients cannot have anyone with them due to current Covid guidelines and our immunocompromised population.

## 2021-03-28 NOTE — Progress Notes (Signed)
[place], [place] [address]   CLINIC:  Medical Oncology/Hematology  PCP:  Hannah Squibb, MD [address] 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 female, returns for routine follow-up and consideration for next cycle of chemotherapy. Hannah [place] was last seen on 03/14/2021.  Due for cycle #2 of FOLFOX today.   Overall, she tells me she has been feeling pretty well. She denies vomiting and tingling/numbness. She reports her appetite waxes and wanes but has improved. She eats 2 small meals daily. She has lost 5 lbs since 1/11. She is taking magnesium daily. She last took potassium as a 1/2 tablet 1 day ago. She reports diarrhea, and nausea which is well controlled with Megace. She reports her energy has improved slightly. She denies abdominal pain. She reports cold sensitivity for 4-5 days following treatment. She denies ankle swellings.   Overall, she feels ready for next cycle of chemo today.    REVIEW OF SYSTEMS:  Review of Systems  Constitutional:  Positive for unexpected weight change (-5 lbs). Negative for appetite change (improved) and fatigue.  HENT:   Positive for trouble swallowing.   Respiratory:  Positive for cough.   Cardiovascular:  Negative for leg swelling.  Gastrointestinal:  Negative for diarrhea, nausea and  vomiting.  Neurological:  Negative for numbness.  Psychiatric/Behavioral:  Positive for depression and sleep disturbance. The patient is nervous/anxious.   All other systems reviewed and are negative.  PAST MEDICAL/SURGICAL HISTORY:  Past Medical History:  Diagnosis Date   Allergy    Arthritis    Diabetes mellitus    Graves disease    with radiation, now on Synthroid   Graves disease    Hypertension    Port-A-Cath in place 03/08/2021   Past Surgical History:  Procedure Laterality Date   BIOPSY  01/23/2021   Procedure: BIOPSY;  Surgeon: [name], DO;  [facility];  Service: Endoscopy;;   CHOLECYSTECTOMY     COLONOSCOPY  09/22/2007   SLF: moderate internal hemorrhoids, multiple 3-4 mm sessile polyps in splenic flexure, hyperplastic.    COLONOSCOPY  09/27/2002   [facility]hman:Small external hemorrhoids/Six tiny   polyps ablated by cold biopsy from sigmoid colon   COLONOSCOPY N/A 06/29/2013   Moderate sized internal hemorrhoids. Sigmoid diverticulosis. Normal TI. Banding X 3.   COLONOSCOPY WITH PROPOFOL N/A 01/23/2021   Procedure: COLONOSCOPY WITH PROPOFOL;  Surgeon: [name], DO;  Location: [facility];  Service: Endoscopy;  Laterality: N/A;  8:00am   ESOPHAGOGASTRODUODENOSCOPY (EGD) WITH PROPOFOL N/A 01/23/2021   Procedure: ESOPHAGOGASTRODUODENOSCOPY (EGD) WITH PROPOFOL;  Surgeon: [name], DO;  [facility];  Service: Endoscopy;  Laterality: N/A;   HEMORRHOID BANDING  06/29/2013   Procedure: HEMORRHOID BANDING;  Surgeon: Danie Binder, MD;  Location: [facility];  Service: Endoscopy;;   IR IMAGING GUIDED PORT INSERTION  02/21/2021   IR US GUIDE BX ASP/DRAIN  02/21/2021   TUBAL LIGATION      SOCIAL HISTORY:  Social History   Socioeconomic History   Marital status: Legally Separated    Spouse name: Not on file   Number of children: Not on file   Years of education: Not on file   Highest education level: Not on file   Occupational History   Occupation: Self-employed    Comment: sitter  Tobacco Use   Smoking status: Never   Smokeless tobacco: Never  Vaping Use   Vaping Use: Never used  Substance and Sexual Activity   Alcohol use: No   Drug use: No   Sexual activity: Not on file  Other Topics Concern   Not on file  Social History Narrative   Not on file   Social Determinants of Health   Financial Resource Strain: Low Risk    Difficulty of Paying Living Expenses: Not very hard  Food Insecurity: No Food Insecurity   Worried About Charity fundraiser in the Last Year: Never true   Ran Out of Food in the Last Year: Never true  Transportation Needs: No Transportation Needs   Lack of Transportation (Medical): No   Lack of Transportation (Non-Medical): No  Physical Activity: Not on file  Stress: No Stress Concern Present   Feeling of Stress : Only a little  Social Connections: Moderately Integrated   Frequency of Communication with Friends and Family: More than three times a week   Frequency of Social Gatherings with Friends and Family: More than three times a week   Attends Religious Services: More than 4 times per year   Active Member of [employer] or Organizations: Yes   Attends Music therapist: More than 4 times per year   Marital Status: Separated  Intimate Partner Violence: Not on file    FAMILY HISTORY:  Family History  Problem Relation Age of Onset   Colon cancer Father 20       deceased   Diabetes Mother    Heart disease Mother    Cancer - Lung Brother     CURRENT MEDICATIONS:  Current Outpatient Medications  Medication Sig Dispense Refill   acetaminophen (TYLENOL) 650 MG CR tablet Take 1,300 mg by mouth every 8 (eight) hours as needed for pain. (Patient not taking: Reported on 03/16/2021)     amLODipine (NORVASC) 5 MG tablet Take 5 mg by mouth every evening.     atorvastatin (LIPITOR) 10 MG tablet Take 1 tablet (10 mg total) by mouth daily. (Patient taking  differently: Take 10 mg by mouth at bedtime.) 90 tablet 3   fluorouracil CALGB 40981 2,400 mg/m2 in sodium chloride 0.9 % 150 mL Inject 2,400 mg/m2 into the vein over 48 hr.     FLUOROURACIL IV Inject into the vein every 14 (fourteen) days.     LEUCOVORIN CALCIUM IV Inject into the vein every 14 (fourteen) days.     levothyroxine (SYNTHROID) 112 MCG tablet TAKE 1 TABLET BY MOUTH EVERY MORNING BEFORE BREAKFAST 90 tablet 3   losartan (COZAAR) 100 MG tablet Take 1 tablet (100 mg total) by mouth daily. 90 tablet 3   magnesium oxide (MAG-OX) 400 (240 Mg) MG tablet Take 1 tablet (400 mg total) by mouth daily. 30 tablet 3   megestrol (MEGACE) 400 MG/10ML suspension Take 10 mLs (400 mg total) by mouth 2 (two) times daily. 480 mL 2  metFORMIN (GLUCOPHAGE) 500 MG tablet Take 1 tablet (500 mg total) by mouth 2 (two) times daily with a meal. 120 tablet 5   OXALIPLATIN IV Inject into the vein every 14 (fourteen) days.     potassium chloride SA (KLOR-CON M) 20 MEQ tablet Take 2 tablets (40 mEq total) by mouth daily. Take 2 tablets every 3 hours today 03/12/2021, prior to bedtime,  then 2 tablets daily thereafter. 63 tablet 1   prochlorperazine (COMPAZINE) 10 MG tablet TAKE 1 TABLET(10 MG) BY MOUTH EVERY 6 HOURS AS NEEDED FOR NAUSEA OR VOMITING 60 tablet 4   scopolamine (TRANSDERM-SCOP) 1 MG/3DAYS Place 1 patch (1.5 mg total) onto the skin every 3 (three) days. 10 patch 12   No current facility-administered medications for this visit.    ALLERGIES:  Allergies  Allergen Reactions   Ace Inhibitors Anaphylaxis and Swelling    PHYSICAL EXAM:  Performance status (ECOG): 0 - Asymptomatic  There were no vitals filed for this visit. Wt Readings from Last 3 Encounters:  03/14/21 175 lb 6.4 oz (79.6 kg)  03/13/21 172 lb 9.6 oz (78.3 kg)  03/08/21 170 lb 6.4 oz (77.3 kg)   Physical Exam Vitals reviewed.  Constitutional:      Appearance: Normal appearance.  Cardiovascular:     Rate and Rhythm: Normal rate  and regular rhythm.     Pulses: Normal pulses.     Heart sounds: Normal heart sounds.  Pulmonary:     Effort: Pulmonary effort is normal.     Breath sounds: Normal breath sounds.  Neurological:     General: No focal deficit present.     Mental Status: She is alert and oriented to person, place, and time.  Psychiatric:        Mood and Affect: Mood normal.        Behavior: Behavior normal.    LABORATORY DATA:  I have reviewed the labs as listed.  CBC Latest Ref Rng & Units 03/13/2021 03/12/2021 02/21/2021  WBC 4.0 - 10.5 K/uL 9.8 8.6 8.2  Hemoglobin 12.0 - 15.0 g/dL 11.1(L) 10.6(L) 10.8(L)  Hematocrit 36.0 - 46.0 % 33.7(L) 32.0(L) 34.2(L)  Platelets 150 - 400 K/uL 281 219 351   CMP Latest Ref Rng & Units 03/14/2021 03/13/2021 03/12/2021  Glucose 70 - 99 mg/dL - 249(H) 103(H)  BUN 8 - 23 mg/dL - 18 19  Creatinine 0.44 - 1.00 mg/dL - 1.27(H) 1.08(H)  Sodium 135 - 145 mmol/L - 136 136  Potassium 3.5 - 5.1 mmol/L 3.5 2.4(LL) 2.2(LL)  [place] 98 - 111 mmol/L - 100 98  CO2 22 - 32 mmol/L - 25 27  Calcium 8.9 - 10.3 mg/dL - 9.1 8.9  Total Protein 6.5 - 8.1 g/dL - 7.2 6.9  Total Bilirubin 0.3 - 1.2 mg/dL - 0.7 0.7  Alkaline Phos 38 - 126 U/L - 73 68  AST 15 - 41 U/L - 16 13(L)  ALT 0 - 44 U/L - 12 9    DIAGNOSTIC IMAGING:  I have independently reviewed the scans and discussed with the patient. No results found.   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 - 30 pound weight loss in the last 6 months. - EGD on 01/23/2021 shows mucosal changes in the gastric body, circumferential mass-stricture - Pathology consistent with poorly differentiated carcinoma with signet ring cells - CT CAP on 02/06/2021 shows gastric wall thickening noted in the mid  and distal stomach.  3.2 cm ill-defined lesion in the anterior right liver.  Abnormal lymph nodes in the gastrohepatic and hepatoduodenal  ligaments with small right checks to diaphragmatic lymph nodes. - Liver biopsy on 02/21/2021 consistent with poorly differentiated carcinoma, gastric origin. - NGS ([facility]) on 01/23/2021-HER2 negative.  MSI-stable.  TMB-Low.  PD-L1 (28-8) negative.  Other targetable mutations negative.  ARID1A and T p53 pathogenic variants detected.     Social/family history: - Her sister lives with her.  She is independent of ADLs and IADLs.  She worked as a Programmer, applications and retired in 2021.  Non-smoker. - Father had colon cancer.  Brother had lung cancer.  One paternal cousin had lung cancer and another paternal cousin had prostate cancer.  Paternal uncle had lung cancer and paternal aunt had pancreatic cancer.   PLAN:  Stage IV (TxN1M1) poorly differentiated gastric carcinoma, HER2 negative: - She has tolerated first cycle reasonably well.  She had cold sensitivity lasted about 4 to 5 days. - Today she denies any tingling or numbness in the extremities. - Reviewed labs today which showed white count of 3.2 with ANC of 0.9.  Platelet count was normal. - We will hold her treatment today until next week.  I plan to include long-acting G-CSF on day 3 of chemotherapy with each cycle. - RTC 2 weeks after cycle 2.  2.  Persistent nausea: - Continue scopolamine patch every 72 hours. - Continue Compazine every 6 hours.  3.  Weight loss: - Continue Megace 400 mg twice daily. - He lost about 5 pounds since last visit.  She is drinking about 2 to 3 cans of Ensure per day.  She has bowel movement after drinking Ensure.  I have recommended switching to [employer].  We have given some samples.  She reports that she has been eating well.  We will closely monitor her weight.  4.  Normocytic anemia: - Hemoglobin today is 9.1, likely from myelosuppression and relative iron deficiency. - Ferritin is 197 and percent saturation is 16.  F29 and folic acid was normal.  We will consider parenteral iron therapy.  5.   Hypokalemia/hypomagnesemia: - Potassium is low at 2.7.  She will receive IV potassium and potassium 40 mEq p.o. - She had trouble swallowing potassium pills at home.  She took her last potassium 2 days ago.  I have told her to restart back on potassium daily. - Magnesium is low at 1.5.  She received 2 g of magnesium today.  We will increase magnesium to twice daily.  6.  Elevated creatinine: - Blood pressure today is 130/82. - Her creatinine increased to 1.65 today.  She will receive hydration. - I have recommended discontinuing Cozaar.   Orders placed this encounter:  No orders of the defined types were placed in this encounter.    Derek Jack, MD [place] [phone]   I, Thana Ates, am acting as a scribe for Dr. Derek Jack.  I, Derek Jack MD, have reviewed the above documentation for accuracy and completeness, and I agree with the above.

## 2021-03-30 ENCOUNTER — Encounter (HOSPITAL_COMMUNITY): Payer: 59

## 2021-04-02 ENCOUNTER — Encounter (HOSPITAL_COMMUNITY): Payer: Self-pay | Admitting: Dietician

## 2021-04-02 ENCOUNTER — Encounter (HOSPITAL_COMMUNITY): Payer: Self-pay | Admitting: Hematology

## 2021-04-02 ENCOUNTER — Inpatient Hospital Stay (HOSPITAL_COMMUNITY): Payer: 59

## 2021-04-02 ENCOUNTER — Other Ambulatory Visit: Payer: Self-pay

## 2021-04-02 ENCOUNTER — Inpatient Hospital Stay (HOSPITAL_COMMUNITY): Payer: 59 | Admitting: Hematology

## 2021-04-02 VITALS — BP 130/76 | HR 96 | Temp 97.4°F | Resp 18 | Ht 65.0 in | Wt 172.8 lb

## 2021-04-02 DIAGNOSIS — Z95828 Presence of other vascular implants and grafts: Secondary | ICD-10-CM

## 2021-04-02 DIAGNOSIS — C169 Malignant neoplasm of stomach, unspecified: Secondary | ICD-10-CM

## 2021-04-02 DIAGNOSIS — D702 Other drug-induced agranulocytosis: Secondary | ICD-10-CM | POA: Insufficient documentation

## 2021-04-02 LAB — COMPREHENSIVE METABOLIC PANEL
ALT: 8 U/L (ref 0–44)
AST: 12 U/L — ABNORMAL LOW (ref 15–41)
Albumin: 3.4 g/dL — ABNORMAL LOW (ref 3.5–5.0)
Alkaline Phosphatase: 79 U/L (ref 38–126)
Anion gap: 8 (ref 5–15)
BUN: 15 mg/dL (ref 8–23)
CO2: 19 mmol/L — ABNORMAL LOW (ref 22–32)
Calcium: 9.4 mg/dL (ref 8.9–10.3)
Chloride: 109 mmol/L (ref 98–111)
Creatinine, Ser: 1.12 mg/dL — ABNORMAL HIGH (ref 0.44–1.00)
GFR, Estimated: 55 mL/min — ABNORMAL LOW (ref >60)
Glucose, Bld: 117 mg/dL — ABNORMAL HIGH (ref 70–99)
Potassium: 4.8 mmol/L (ref 3.5–5.1)
Sodium: 136 mmol/L (ref 135–145)
Total Bilirubin: 0.8 mg/dL (ref 0.3–1.2)
Total Protein: 6.8 g/dL (ref 6.5–8.1)

## 2021-04-02 LAB — MAGNESIUM: Magnesium: 1.5 mg/dL — ABNORMAL LOW (ref 1.7–2.4)

## 2021-04-02 LAB — CBC WITH DIFFERENTIAL/PLATELET
Basophils Absolute: 0 10*3/uL (ref 0.0–0.1)
Basophils Relative: 0 %
Eosinophils Absolute: 0 10*3/uL (ref 0.0–0.5)
Eosinophils Relative: 1 %
HCT: 29.2 % — ABNORMAL LOW (ref 36.0–46.0)
Hemoglobin: 9.1 g/dL — ABNORMAL LOW (ref 12.0–15.0)
Lymphocytes Relative: 73 %
Lymphs Abs: 2.4 10*3/uL (ref 0.7–4.0)
MCH: 26.9 pg (ref 26.0–34.0)
MCHC: 31.2 g/dL (ref 30.0–36.0)
MCV: 86.4 fL (ref 80.0–100.0)
Monocytes Absolute: 0.4 10*3/uL (ref 0.1–1.0)
Monocytes Relative: 13 %
Neutro Abs: 0.4 10*3/uL — CL (ref 1.7–7.7)
Neutrophils Relative %: 13 %
Platelets: 197 10*3/uL (ref 150–400)
RBC: 3.38 MIL/uL — ABNORMAL LOW (ref 3.87–5.11)
RDW: 19.2 % — ABNORMAL HIGH (ref 11.5–15.5)
WBC: 3.3 10*3/uL — ABNORMAL LOW (ref 4.0–10.5)
nRBC: 0 % (ref 0.0–0.2)

## 2021-04-02 MED ORDER — SODIUM CHLORIDE 0.9% FLUSH
10.0000 mL | Freq: Once | INTRAVENOUS | Status: AC
  Administered 2021-04-02: 10 mL via INTRAVENOUS

## 2021-04-02 MED ORDER — FILGRASTIM-SNDZ 480 MCG/0.8ML IJ SOSY
480.0000 ug | PREFILLED_SYRINGE | Freq: Once | INTRAMUSCULAR | Status: AC
  Administered 2021-04-02: 480 ug via SUBCUTANEOUS
  Filled 2021-04-02: qty 0.8

## 2021-04-02 MED ORDER — SODIUM CHLORIDE 0.9 % IV SOLN: INTRAVENOUS | Status: DC

## 2021-04-02 MED ORDER — HEPARIN SOD (PORK) LOCK FLUSH 100 UNIT/ML IV SOLN
500.0000 [IU] | Freq: Once | INTRAVENOUS | Status: AC
  Administered 2021-04-02: 500 [IU] via INTRAVENOUS

## 2021-04-02 MED ORDER — MAGNESIUM SULFATE 2 GM/50ML IV SOLN
2.0000 g | Freq: Once | INTRAVENOUS | Status: AC
  Administered 2021-04-02: 2 g via INTRAVENOUS
  Filled 2021-04-02: qty 50

## 2021-04-02 NOTE — Patient Instructions (Signed)
[facility]  Discharge Instructions: Thank you for choosing [facility] to provide your oncology and hematology care.  If you have a lab appointment with the [facility], please come in thru the Main Entrance and check in at the main information desk.  Wear comfortable clothing and clothing appropriate for easy access to any Portacath or PICC line.   We strive to give you quality time with your provider. You may need to reschedule your appointment if you arrive late (15 or more minutes).  Arriving late affects you and other patients whose appointments are after yours.  Also, if you miss three or more appointments without notifying the office, you may be dismissed from the clinic at the providers discretion.      For prescription refill requests, have your pharmacy contact our office and allow 72 hours for refills to be completed.    Today you received the following chemotherapy and/or immunotherapy agents 2g of magnesium sulfate and a Zarxio injection. Return tomorrow for labs and possible treatment.   To help prevent nausea and vomiting after your treatment, we encourage you to take your nausea medication as directed.  BELOW ARE SYMPTOMS THAT SHOULD BE REPORTED IMMEDIATELY: *FEVER GREATER THAN 100.4 F (38 C) OR HIGHER *CHILLS OR SWEATING *NAUSEA AND VOMITING THAT IS NOT CONTROLLED WITH YOUR NAUSEA MEDICATION *UNUSUAL SHORTNESS OF BREATH *UNUSUAL BRUISING OR BLEEDING *URINARY PROBLEMS (pain or burning when urinating, or frequent urination) *BOWEL PROBLEMS (unusual diarrhea, constipation, pain near the anus) TENDERNESS IN MOUTH AND THROAT WITH OR WITHOUT PRESENCE OF ULCERS (sore throat, sores in mouth, or a toothache) UNUSUAL RASH, SWELLING OR PAIN  UNUSUAL VAGINAL DISCHARGE OR ITCHING   Items with * indicate a potential emergency and should be followed up as soon as possible or go to the Emergency Department if any problems should occur.  Please show the  CHEMOTHERAPY ALERT CARD or IMMUNOTHERAPY ALERT CARD at check-in to the Emergency Department and triage nurse.  Should you have questions after your visit or need to cancel or reschedule your appointment, please contact [facility] [phone]  and follow the prompts.  Office hours are 8:00 a.m. to 4:30 p.m. Monday - Friday. Please note that voicemails left after 4:00 p.m. may not be returned until the following business day.  We are closed weekends and major holidays. You have access to a nurse at all times for urgent questions. Please call the main number to the clinic [phone] and follow the prompts.  For any non-urgent questions, you may also contact your provider using MyChart. We now offer e-Visits for anyone 33 and older to request care online for non-urgent symptoms. For details visit [email or web address].   Also download the MyChart app! Go to the app store, search "MyChart", open the app, select [facility], and log in with your MyChart username and password.  Due to Covid, a mask is required upon entering the hospital/clinic. If you do not have a mask, one will be given to you upon arrival. For doctor visits, patients may have 1 support person aged 39 or older with them. For treatment visits, patients cannot have anyone with them due to current Covid guidelines and our immunocompromised population.

## 2021-04-02 NOTE — Progress Notes (Signed)
Patient presents today for possible FOLFOX. ANC 0.4 and Magnesium 1.5. Per Dr. Cam Hai treatment today, give 2g of Magnesium and Zarxio injection. Patient is to return to clinic tomorrow for possible treatment.  Patient tolerated Zarxio  injection with no complaints voiced. Site clean and dry with no bruising or swelling noted at site. See MAR for details. Band aid applied.  Patient stable during and after injection.   Patient tolerated Magnesium with no complaints voiced. Side effects with management reviewed with understanding verbalized. Port site clean and dry with no bruising or swelling noted at site. Good blood return noted before and after administration of therapy. Patient left accessed. Patient left in satisfactory condition with VSS and no s/s of distress noted.

## 2021-04-02 NOTE — Patient Instructions (Addendum)
[place] at [facility] Discharge Instructions   You were seen and examined today by Dr. Delton Coombes.  Your white blood cell count is low.  We will give you a shot to help boost those cells today, and then we will bring you in for treatment tomorrow.  We will give you IV magnesium today.  Your level is low at 1.5.  Increase your magnesium 2 tablets twice a day.  Return as scheduled tomorrow.    Thank you for choosing [place] at [facility] to provide your oncology and hematology care.  To afford each patient quality time with our provider, please arrive at least 15 minutes before your scheduled appointment time.   If you have a lab appointment with the [facility] please come in thru the Main Entrance and check in at the main information desk.  You need to re-schedule your appointment should you arrive 10 or more minutes late.  We strive to give you quality time with our providers, and arriving late affects you and other patients whose appointments are after yours.  Also, if you no show three or more times for appointments you may be dismissed from the clinic at the providers discretion.     Again, thank you for choosing [facility].  Our hope is that these requests will decrease the amount of time that you wait before being seen by our physicians.       _____________________________________________________________  Should you have questions after your visit to [facility], please contact our office at [phone] and follow the prompts.  Our office hours are 8:00 a.m. and 4:30 p.m. Monday - Friday.  Please note that voicemails left after 4:00 p.m. may not be returned until the following business day.  We are closed weekends and major holidays.  You do have access to a nurse 24-7, just call the main number to the clinic [phone] and do not press any options, hold on the line and a nurse will answer the phone.     For prescription refill requests, have your pharmacy contact our office and allow 72 hours.    Due to Covid, you will need to wear a mask upon entering the hospital. If you do not have a mask, a mask will be given to you at the Main Entrance upon arrival. For doctor visits, patients may have 1 support person age 85 or older with them. For treatment visits, patients can not have anyone with them due to social distancing guidelines and our immunocompromised population.

## 2021-04-02 NOTE — Progress Notes (Signed)
Hannah Knight, [place] [address]   CLINIC:  Medical Oncology/Hematology  PCP:  Celene Squibb, MD [address] 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, a 66 y.o. female, returns for routine follow-up and consideration for next cycle of chemotherapy. Keia was last seen on 03/28/2021.  Due for cycle #2 of FOLFOX today.   Overall, she tells me she has been feeling pretty well. She reports she is eating better; she is eating 3 solid meals along with 2 Ensure daily. She gained 2 lbs since 01/25. Her nausea has improved with compazine and scopolamine patches. Her energy has improved, and she denies tingling/numbness.   Overall, she feels ready for next cycle of chemo today.   REVIEW OF SYSTEMS:  Review of Systems  Constitutional:  Negative for appetite change (improved), fatigue and unexpected weight change (+2 lbs).  Respiratory:  Positive for shortness of breath.   Gastrointestinal:  Nausea: improved.  Neurological:  Negative for numbness.  All other systems reviewed and are negative.  PAST MEDICAL/SURGICAL HISTORY:  Past Medical History:  Diagnosis Date   Allergy    Arthritis    Diabetes mellitus    Graves disease    with radiation, now on Synthroid   Graves disease    Hypertension    Port-A-Cath in place 03/08/2021   Past  Surgical History:  Procedure Laterality Date   BIOPSY  01/23/2021   Procedure: BIOPSY;  Surgeon: [name], DO;  [facility];  Service: Endoscopy;;   CHOLECYSTECTOMY     COLONOSCOPY  09/22/2007   SLF: moderate internal hemorrhoids, multiple 3-4 mm sessile polyps in splenic flexure, hyperplastic.    COLONOSCOPY  09/27/2002   Rehman:Small external hemorrhoids/Six tiny   polyps ablated by cold biopsy from sigmoid colon   COLONOSCOPY N/A 06/29/2013   Moderate sized internal hemorrhoids. Sigmoid diverticulosis. Normal TI. Banding X 3.   COLONOSCOPY WITH PROPOFOL N/A 01/23/2021   Procedure: COLONOSCOPY WITH PROPOFOL;  Surgeon: [name], DO;  Location: [facility];  Service: Endoscopy;  Laterality: N/A;  8:00am   ESOPHAGOGASTRODUODENOSCOPY (EGD) WITH PROPOFOL N/A 01/23/2021   Procedure: ESOPHAGOGASTRODUODENOSCOPY (EGD) WITH PROPOFOL;  Surgeon: [name], DO;  [facility];  Service: Endoscopy;  Laterality: N/A;   HEMORRHOID BANDING  06/29/2013   Procedure: HEMORRHOID BANDING;  Surgeon: Danie Binder, MD;  Location: [facility];  Service: Endoscopy;;   IR IMAGING GUIDED PORT INSERTION  02/21/2021   IR US GUIDE BX ASP/DRAIN  02/21/2021   TUBAL LIGATION      SOCIAL HISTORY:  Social History   Socioeconomic History   Marital status: Legally Separated    Spouse name: Not on file   Number of children: Not on file   Years of education: Not on file   Highest education level: Not  on file  Occupational History   Occupation: Self-employed    CommentQuarry manager  Tobacco Use   Smoking status: Never   Smokeless tobacco: Never  Vaping Use   Vaping Use: Never used  Substance and Sexual Activity   Alcohol use: No   Drug use: No   Sexual activity: Not on file  Other Topics Concern   Not on file  Social History Narrative   Not on file   Social Determinants of Health   Financial Resource Strain: Low Risk    Difficulty of Paying Living Expenses:  Not very hard  Food Insecurity: No Food Insecurity   Worried About Charity fundraiser in the Last Year: Never true   Ran Out of Food in the Last Year: Never true  Transportation Needs: No Transportation Needs   Lack of Transportation (Medical): No   Lack of Transportation (Non-Medical): No  Physical Activity: Not on file  Stress: No Stress Concern Present   Feeling of Stress : Only a little  Social Connections: Moderately Integrated   Frequency of Communication with Friends and Family: More than three times a week   Frequency of Social Gatherings with Friends and Family: More than three times a week   Attends Religious Services: More than 4 times per year   Active Member of [employer] or Organizations: Yes   Attends Music therapist: More than 4 times per year   Marital Status: Separated  Intimate Partner Violence: Not on file    FAMILY HISTORY:  Family History  Problem Relation Age of Onset   Colon cancer Father 52       deceased   Diabetes Mother    Heart disease Mother    Cancer - Lung Brother     CURRENT MEDICATIONS:  Current Outpatient Medications  Medication Sig Dispense Refill   acetaminophen (TYLENOL) 650 MG CR tablet Take 1,300 mg by mouth every 8 (eight) hours as needed for pain.     amLODipine (NORVASC) 5 MG tablet Take 5 mg by mouth every evening.     atorvastatin (LIPITOR) 10 MG tablet Take 1 tablet (10 mg total) by mouth daily. (Patient taking differently: Take 10 mg by mouth at bedtime.) 90 tablet 3   fluorouracil CALGB 47425 2,400 mg/m2 in sodium chloride 0.9 % 150 mL Inject 2,400 mg/m2 into the vein over 48 hr.     FLUOROURACIL IV Inject into the vein every 14 (fourteen) days.     LEUCOVORIN CALCIUM IV Inject into the vein every 14 (fourteen) days.     levothyroxine (SYNTHROID) 112 MCG tablet TAKE 1 TABLET BY MOUTH EVERY MORNING BEFORE BREAKFAST 90 tablet 3   losartan (COZAAR) 100 MG tablet Take 1 tablet (100 mg total) by mouth daily. 90 tablet 3    magnesium oxide (MAG-OX) 400 (240 Mg) MG tablet Take 1 tablet (400 mg total) by mouth 2 (two) times daily. 60 tablet 3   megestrol (MEGACE) 400 MG/10ML suspension Take 10 mLs (400 mg total) by mouth 2 (two) times daily. 480 mL 2   metFORMIN (GLUCOPHAGE) 500 MG tablet Take 1 tablet (500 mg total) by mouth 2 (two) times daily with a meal. 120 tablet 5   OXALIPLATIN IV Inject into the vein every 14 (fourteen) days.     potassium chloride SA (KLOR-CON M) 20 MEQ tablet Take 2 tablets (40 mEq total) by mouth daily. Take 2 tablets every 3 hours today 03/12/2021, prior to bedtime,  then 2 tablets daily thereafter. 63 tablet 1  prochlorperazine (COMPAZINE) 10 MG tablet TAKE 1 TABLET(10 MG) BY MOUTH EVERY 6 HOURS AS NEEDED FOR NAUSEA OR VOMITING 60 tablet 4   scopolamine (TRANSDERM-SCOP) 1 MG/3DAYS Place 1 patch (1.5 mg total) onto the skin every 3 (three) days. 10 patch 12   No current facility-administered medications for this visit.    ALLERGIES:  Allergies  Allergen Reactions   Ace Inhibitors Anaphylaxis and Swelling    PHYSICAL EXAM:  Performance status (ECOG): 0 - Asymptomatic  Vitals:   04/02/21 0812  BP: 130/76  Pulse: 96  Resp: 18  Temp: (!) 97.4 F (36.3 C)  SpO2: 100%   Wt Readings from Last 3 Encounters:  04/02/21 172 lb 12.8 oz (78.4 kg)  03/28/21 170 lb 3.2 oz (77.2 kg)  03/14/21 175 lb 6.4 oz (79.6 kg)   Physical Exam Vitals reviewed.  Constitutional:      Appearance: Normal appearance.  Cardiovascular:     Rate and Rhythm: Normal rate and regular rhythm.     Pulses: Normal pulses.     Heart sounds: Normal heart sounds.  Pulmonary:     Effort: Pulmonary effort is normal.     Breath sounds: Normal breath sounds.  Neurological:     General: No focal deficit present.     Mental Status: She is alert and oriented to person, place, and time.  Psychiatric:        Mood and Affect: Mood normal.        Behavior: Behavior normal.    LABORATORY DATA:  I have reviewed the  labs as listed.  CBC Latest Ref Rng & Units 04/02/2021 03/28/2021 03/13/2021  WBC 4.0 - 10.5 K/uL 3.3(L) 3.2(L) 9.8  Hemoglobin 12.0 - 15.0 g/dL 9.1(L) 9.1(L) 11.1(L)  Hematocrit 36.0 - 46.0 % 29.2(L) 28.4(L) 33.7(L)  Platelets 150 - 400 K/uL 197 195 281   CMP Latest Ref Rng & Units 04/02/2021 03/28/2021 03/14/2021  Glucose 70 - 99 mg/dL 117(H) 113(H) -  BUN 8 - 23 mg/dL 15 14 -  Creatinine 0.44 - 1.00 mg/dL 1.12(H) 1.65(H) -  Sodium 135 - 145 mmol/L 136 137 -  Potassium 3.5 - 5.1 mmol/L 4.8 2.9(L) 3.5  Chloride 98 - 111 mmol/L 109 106 -  CO2 22 - 32 mmol/L 19(L) 20(L) -  Calcium 8.9 - 10.3 mg/dL 9.4 9.2 -  Total Protein 6.5 - 8.1 g/dL 6.8 6.7 -  Total Bilirubin 0.3 - 1.2 mg/dL 0.8 1.2 -  Alkaline Phos 38 - 126 U/L 79 67 -  AST 15 - 41 U/L 12(L) 14(L) -  ALT 0 - 44 U/L 8 8 -    DIAGNOSTIC IMAGING:  I have independently reviewed the scans and discussed with the patient. No results found.   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 - 30 pound weight loss in the last 6 months. - EGD on 01/23/2021 shows mucosal changes in the gastric body, circumferential mass-stricture - Pathology consistent with poorly differentiated carcinoma with signet ring cells - CT CAP on 02/06/2021 shows gastric wall thickening noted in the mid and distal stomach.  3.2 cm ill-defined lesion in the anterior right liver.  Abnormal lymph nodes in the gastrohepatic and hepatoduodenal ligaments with small right checks to diaphragmatic lymph nodes. - Liver biopsy on 02/21/2021 consistent with poorly differentiated carcinoma, gastric origin. - NGS ([facility]) on 01/23/2021-HER2 negative.  MSI-stable.  TMB-Low.  PD-L1 (28-8) negative.  Other targetable  mutations negative.  ARID1A and T p53 pathogenic variants detected.     Social/family history: - Her sister lives with her.  She is independent of ADLs and IADLs.   She worked as a Programmer, applications and retired in 2021.  Non-smoker. - Father had colon cancer.  Brother had lung cancer.  One paternal cousin had lung cancer and another paternal cousin had prostate cancer.  Paternal uncle had lung cancer and paternal aunt had pancreatic cancer.   PLAN:  Stage IV (TxN1M1) poorly differentiated gastric carcinoma, HER2 negative: - Last week we held her treatment because of neutropenia. - Today her white count is 3.3 with ANC of 400.  We will hold her chemo.  She will be given G-CSF 480 mcg.  We will check her CBC tomorrow.  If her [place] is more than 1000, we will proceed with her cycle 2.  We will also use long-acting G-CSF on the day of pump DC. - RTC 2 weeks for follow-up.  2.  Persistent nausea: - Continue scopolamine patch every 72 hours. - Continue Compazine every 6 hours.  3.  Weight loss: - Continue Megace 400 mg twice daily. - She gained about 2 pounds in the last 1 week.  She is drinking 2 cans of Ensure and eating 3 small meals per day.  4.  Normocytic anemia: - Hemoglobin is 9.1 today.  Ferritin is 197 and percent saturation 16.  She has mild degree of CKD. - We will consider parenteral iron therapy if there is any worsening.  5.  Hypokalemia/hypomagnesemia: - Continue potassium 20 mEq twice daily. - Magnesium is low at 1.5.  Will increase magnesium to 2 tablets twice daily.  6.  Elevated creatinine: - We have held Cozaar last week.  Blood pressure is 130/76.  Creatinine improved to 1.12.   Orders placed this encounter:  No orders of the defined types were placed in this encounter.    Derek Jack, MD [place] [phone]   I, Thana Ates, am acting as a scribe for Dr. Derek Jack.  I, Derek Jack MD, have reviewed the above documentation for accuracy and completeness, and I agree with the above.

## 2021-04-02 NOTE — Progress Notes (Signed)
Patients port flushed without difficulty.  Good blood return noted with no bruising or swelling noted at site.  Stable during access and blood draw.  Patient to remain accessed for treatment. 

## 2021-04-02 NOTE — Progress Notes (Signed)
Unable to reach patient via telephone for nutrition follow-up. Left voicemail with request for return call. Contact information provided.

## 2021-04-03 ENCOUNTER — Inpatient Hospital Stay (HOSPITAL_COMMUNITY): Payer: 59

## 2021-04-03 VITALS — BP 135/83 | HR 86 | Temp 98.8°F | Resp 18

## 2021-04-03 DIAGNOSIS — C169 Malignant neoplasm of stomach, unspecified: Secondary | ICD-10-CM

## 2021-04-03 DIAGNOSIS — Z95828 Presence of other vascular implants and grafts: Secondary | ICD-10-CM

## 2021-04-03 LAB — CBC WITH DIFFERENTIAL/PLATELET
Abs Immature Granulocytes: 0.11 10*3/uL — ABNORMAL HIGH (ref 0.00–0.07)
Basophils Absolute: 0.1 10*3/uL (ref 0.0–0.1)
Basophils Relative: 1 %
Eosinophils Absolute: 0.1 10*3/uL (ref 0.0–0.5)
Eosinophils Relative: 1 %
HCT: 30.3 % — ABNORMAL LOW (ref 36.0–46.0)
Hemoglobin: 9.9 g/dL — ABNORMAL LOW (ref 12.0–15.0)
Immature Granulocytes: 1 %
Lymphocytes Relative: 31 %
Lymphs Abs: 4.1 10*3/uL — ABNORMAL HIGH (ref 0.7–4.0)
MCH: 28.3 pg (ref 26.0–34.0)
MCHC: 32.7 g/dL (ref 30.0–36.0)
MCV: 86.6 fL (ref 80.0–100.0)
Monocytes Absolute: 1.7 10*3/uL — ABNORMAL HIGH (ref 0.1–1.0)
Monocytes Relative: 13 %
Neutro Abs: 7.4 10*3/uL (ref 1.7–7.7)
Neutrophils Relative %: 53 %
Platelets: 228 10*3/uL (ref 150–400)
RBC: 3.5 MIL/uL — ABNORMAL LOW (ref 3.87–5.11)
RDW: 19.8 % — ABNORMAL HIGH (ref 11.5–15.5)
WBC: 13.5 10*3/uL — ABNORMAL HIGH (ref 4.0–10.5)
nRBC: 0.5 % — ABNORMAL HIGH (ref 0.0–0.2)

## 2021-04-03 MED ORDER — LEUCOVORIN CALCIUM INJECTION 350 MG
400.0000 mg/m2 | Freq: Once | INTRAVENOUS | Status: AC
  Administered 2021-04-03: 760 mg via INTRAVENOUS
  Filled 2021-04-03: qty 38

## 2021-04-03 MED ORDER — FLUOROURACIL CHEMO INJECTION 2.5 GM/50ML
400.0000 mg/m2 | Freq: Once | INTRAVENOUS | Status: AC
  Administered 2021-04-03: 750 mg via INTRAVENOUS
  Filled 2021-04-03: qty 15

## 2021-04-03 MED ORDER — SODIUM CHLORIDE 0.9 % IV SOLN
5000.0000 mg | INTRAVENOUS | Status: DC
  Administered 2021-04-03: 5000 mg via INTRAVENOUS
  Filled 2021-04-03: qty 100

## 2021-04-03 MED ORDER — PALONOSETRON HCL INJECTION 0.25 MG/5ML
0.2500 mg | Freq: Once | INTRAVENOUS | Status: AC
  Administered 2021-04-03: 0.25 mg via INTRAVENOUS
  Filled 2021-04-03: qty 5

## 2021-04-03 MED ORDER — DEXTROSE 5 % IV SOLN: Freq: Once | INTRAVENOUS | Status: AC

## 2021-04-03 MED ORDER — SODIUM CHLORIDE 0.9 % IV SOLN
10.0000 mg | Freq: Once | INTRAVENOUS | Status: AC
  Administered 2021-04-03: 10 mg via INTRAVENOUS
  Filled 2021-04-03: qty 10

## 2021-04-03 MED ORDER — OXALIPLATIN CHEMO INJECTION 100 MG/20ML
150.0000 mg | Freq: Once | INTRAVENOUS | Status: AC
  Administered 2021-04-03: 150 mg via INTRAVENOUS
  Filled 2021-04-03: qty 20

## 2021-04-03 NOTE — Progress Notes (Signed)
Patients port flushed without difficulty.  Good blood return noted with no bruising or swelling noted at site.  Stable during access and blood draw.  Patient to remain accessed for treatment. 

## 2021-04-03 NOTE — Progress Notes (Signed)
Pt presents today for Folfox per provider's order. Vital signs stable and labs WNL for treatment today. Okay to proceed with treatment today.  Folfox given today per MD orders. Tolerated infusion without adverse affects. Vital signs stable. No complaints at this time. Discharged from clinic ambulatory in stable condition. Alert and oriented x 3. F/U with [facility] as scheduled. 5FU ambulatory pump infusing.

## 2021-04-03 NOTE — Patient Instructions (Signed)
[facility]  Discharge Instructions: Thank you for choosing [facility] to provide your oncology and hematology care.  If you have a lab appointment with the [facility], please come in thru the Main Entrance and check in at the main information desk.  Wear comfortable clothing and clothing appropriate for easy access to any Portacath or PICC line.   We strive to give you quality time with your provider. You may need to reschedule your appointment if you arrive late (15 or more minutes).  Arriving late affects you and other patients whose appointments are after yours.  Also, if you miss three or more appointments without notifying the office, you may be dismissed from the clinic at the providers discretion.      For prescription refill requests, have your pharmacy contact our office and allow 72 hours for refills to be completed.    Today you received the following chemotherapy and/or immunotherapy agents Folfox   To help prevent nausea and vomiting after your treatment, we encourage you to take your nausea medication as directed.  BELOW ARE SYMPTOMS THAT SHOULD BE REPORTED IMMEDIATELY: *FEVER GREATER THAN 100.4 F (38 C) OR HIGHER *CHILLS OR SWEATING *NAUSEA AND VOMITING THAT IS NOT CONTROLLED WITH YOUR NAUSEA MEDICATION *UNUSUAL SHORTNESS OF BREATH *UNUSUAL BRUISING OR BLEEDING *URINARY PROBLEMS (pain or burning when urinating, or frequent urination) *BOWEL PROBLEMS (unusual diarrhea, constipation, pain near the anus) TENDERNESS IN MOUTH AND THROAT WITH OR WITHOUT PRESENCE OF ULCERS (sore throat, sores in mouth, or a toothache) UNUSUAL RASH, SWELLING OR PAIN  UNUSUAL VAGINAL DISCHARGE OR ITCHING   Items with * indicate a potential emergency and should be followed up as soon as possible or go to the Emergency Department if any problems should occur.  Please show the CHEMOTHERAPY ALERT CARD or IMMUNOTHERAPY ALERT CARD at check-in to the Emergency Department  and triage nurse.  Should you have questions after your visit or need to cancel or reschedule your appointment, please contact [facility] [phone]  and follow the prompts.  Office hours are 8:00 a.m. to 4:30 p.m. Monday - Friday. Please note that voicemails left after 4:00 p.m. may not be returned until the following business day.  We are closed weekends and major holidays. You have access to a nurse at all times for urgent questions. Please call the main number to the clinic [phone] and follow the prompts.  For any non-urgent questions, you may also contact your provider using MyChart. We now offer e-Visits for anyone 72 and older to request care online for non-urgent symptoms. For details visit [email or web address].   Also download the MyChart app! Go to the app store, search "MyChart", open the app, select [facility], and log in with your MyChart username and password.  Due to Covid, a mask is required upon entering the hospital/clinic. If you do not have a mask, one will be given to you upon arrival. For doctor visits, patients may have 1 support person aged 9 or older with them. For treatment visits, patients cannot have anyone with them due to current Covid guidelines and our immunocompromised population.

## 2021-04-04 ENCOUNTER — Encounter (HOSPITAL_COMMUNITY): Payer: 59

## 2021-04-05 ENCOUNTER — Inpatient Hospital Stay (HOSPITAL_COMMUNITY): Payer: 59 | Attending: Hematology

## 2021-04-05 VITALS — BP 132/75 | HR 90 | Temp 98.9°F | Resp 18

## 2021-04-05 DIAGNOSIS — Z79899 Other long term (current) drug therapy: Secondary | ICD-10-CM | POA: Diagnosis not present

## 2021-04-05 DIAGNOSIS — Z95828 Presence of other vascular implants and grafts: Secondary | ICD-10-CM

## 2021-04-05 DIAGNOSIS — D649 Anemia, unspecified: Secondary | ICD-10-CM | POA: Diagnosis not present

## 2021-04-05 DIAGNOSIS — E876 Hypokalemia: Secondary | ICD-10-CM | POA: Diagnosis not present

## 2021-04-05 DIAGNOSIS — C169 Malignant neoplasm of stomach, unspecified: Secondary | ICD-10-CM | POA: Insufficient documentation

## 2021-04-05 DIAGNOSIS — Z5111 Encounter for antineoplastic chemotherapy: Secondary | ICD-10-CM | POA: Diagnosis present

## 2021-04-05 MED ORDER — PEGFILGRASTIM-BMEZ 6 MG/0.6ML ~~LOC~~ SOSY
6.0000 mg | PREFILLED_SYRINGE | Freq: Once | SUBCUTANEOUS | Status: AC
  Administered 2021-04-05: 6 mg via SUBCUTANEOUS
  Filled 2021-04-05: qty 0.6

## 2021-04-05 MED ORDER — SODIUM CHLORIDE 0.9% FLUSH
10.0000 mL | INTRAVENOUS | Status: DC | PRN
  Administered 2021-04-05: 10 mL

## 2021-04-05 MED ORDER — HEPARIN SOD (PORK) LOCK FLUSH 100 UNIT/ML IV SOLN
500.0000 [IU] | Freq: Once | INTRAVENOUS | Status: AC | PRN
  Administered 2021-04-05: 500 [IU]

## 2021-04-05 NOTE — Patient Instructions (Signed)
[facility]  Discharge Instructions: Thank you for choosing [facility] to provide your oncology and hematology care.  If you have a lab appointment with the [facility], please come in thru the Main Entrance and check in at the main information desk.  Wear comfortable clothing and clothing appropriate for easy access to any Portacath or PICC line.   We strive to give you quality time with your provider. You may need to reschedule your appointment if you arrive late (15 or more minutes).  Arriving late affects you and other patients whose appointments are after yours.  Also, if you miss three or more appointments without notifying the office, you may be dismissed from the clinic at the providers discretion.      For prescription refill requests, have your pharmacy contact our office and allow 72 hours for refills to be completed.    Today you received the following chemotherapy and/or immunotherapy agents Pump DC and Ziextenzo      To help prevent nausea and vomiting after your treatment, we encourage you to take your nausea medication as directed.  BELOW ARE SYMPTOMS THAT SHOULD BE REPORTED IMMEDIATELY: *FEVER GREATER THAN 100.4 F (38 C) OR HIGHER *CHILLS OR SWEATING *NAUSEA AND VOMITING THAT IS NOT CONTROLLED WITH YOUR NAUSEA MEDICATION *UNUSUAL SHORTNESS OF BREATH *UNUSUAL BRUISING OR BLEEDING *URINARY PROBLEMS (pain or burning when urinating, or frequent urination) *BOWEL PROBLEMS (unusual diarrhea, constipation, pain near the anus) TENDERNESS IN MOUTH AND THROAT WITH OR WITHOUT PRESENCE OF ULCERS (sore throat, sores in mouth, or a toothache) UNUSUAL RASH, SWELLING OR PAIN  UNUSUAL VAGINAL DISCHARGE OR ITCHING   Items with * indicate a potential emergency and should be followed up as soon as possible or go to the Emergency Department if any problems should occur.  Please show the CHEMOTHERAPY ALERT CARD or IMMUNOTHERAPY ALERT CARD at check-in to the  Emergency Department and triage nurse.  Should you have questions after your visit or need to cancel or reschedule your appointment, please contact [facility] [phone]  and follow the prompts.  Office hours are 8:00 a.m. to 4:30 p.m. Monday - Friday. Please note that voicemails left after 4:00 p.m. may not be returned until the following business day.  We are closed weekends and major holidays. You have access to a nurse at all times for urgent questions. Please call the main number to the clinic [phone] and follow the prompts.  For any non-urgent questions, you may also contact your provider using MyChart. We now offer e-Visits for anyone 53 and older to request care online for non-urgent symptoms. For details visit [email or web address].   Also download the MyChart app! Go to the app store, search "MyChart", open the app, select [place], and log in with your MyChart username and password.  Due to Covid, a mask is required upon entering the hospital/clinic. If you do not have a mask, one will be given to you upon arrival. For doctor visits, patients may have 1 support person aged 43 or older with them. For treatment visits, patients cannot have anyone with them due to current Covid guidelines and our immunocompromised population.

## 2021-04-05 NOTE — Progress Notes (Signed)
Patient presents today for 5FU pump stop and disconnection after 46 hour continous infusion.   5FU pump deaccessed.  Patients port flushed without difficulty.  Good blood return noted with no bruising or swelling noted at site.  Needle removed intact.  Band aid applied.  Ziextenzo administration without incident; injection site WNL; see MAR for injection details.  Patient tolerated procedure well and without incident.  No questions or complaints noted at this time. VSS with discharge and left in satisfactory condition ambulatory with no s/s of distress noted.

## 2021-04-11 ENCOUNTER — Ambulatory Visit (HOSPITAL_COMMUNITY): Payer: 59 | Admitting: Hematology

## 2021-04-11 ENCOUNTER — Ambulatory Visit (HOSPITAL_COMMUNITY): Payer: 59

## 2021-04-11 ENCOUNTER — Other Ambulatory Visit (HOSPITAL_COMMUNITY): Payer: 59

## 2021-04-12 ENCOUNTER — Telehealth (HOSPITAL_COMMUNITY): Payer: 59 | Admitting: Licensed Clinical Social Worker

## 2021-04-13 ENCOUNTER — Encounter (HOSPITAL_COMMUNITY): Payer: 59

## 2021-04-16 ENCOUNTER — Ambulatory Visit (HOSPITAL_COMMUNITY): Payer: 59 | Admitting: Hematology

## 2021-04-16 ENCOUNTER — Ambulatory Visit (HOSPITAL_COMMUNITY): Payer: 59

## 2021-04-16 ENCOUNTER — Telehealth (HOSPITAL_COMMUNITY): Payer: Self-pay | Admitting: Dietician

## 2021-04-16 ENCOUNTER — Encounter (HOSPITAL_COMMUNITY): Payer: 59 | Admitting: Dietician

## 2021-04-16 ENCOUNTER — Other Ambulatory Visit (HOSPITAL_COMMUNITY): Payer: 59

## 2021-04-16 NOTE — Telephone Encounter (Signed)
Nutrition Follow-up:  Patient receiving FOLFOX for stage IV gastric cancer.   Spoke with patient via telephone. She reports feeling "really tired" since starting chemotherapy. Patient reports mild cold sensitivity lasting ~4-5 days after treatment. Patient has poor appetite due to thick secretions. Patient is using scopolamine patch. Nausea has improved with this. Patient has used baking soda salt water rinses a few times. She had part of chicken wing yesterday and 2 Ensure Plus. She denies nausea, vomiting, diarrhea, constipation.   Medications: Mag-ox, Compazine, Klor-con, Megace, Scopolamine  Labs: 1/31 labs reviewed   Anthropometrics: Last weight 172 lb 12.8 oz on 1/30 increased   1/25 - 170 lb 3.2 oz  1/11 - 175 lb 6.4 oz  1/05 - 170 lb 6.4 oz   NUTRITION DIAGNOSIS: Severe malnutrition continues    INTERVENTION:  Continued encouragement to eat small amounts of food frequently with adequate calories and protein Discussed ideas for warm supplements - will provide recipes Recommend pt drink 3 Ensure Plus/equivalent daily One complimentary case of Ensure Plus High Protein and recipes left at registration desk for pickup on 2/14 - pt aware     MONITORING, EVALUATION, GOAL: weight trends, intake    NEXT VISIT: Thursday March 2

## 2021-04-17 ENCOUNTER — Other Ambulatory Visit: Payer: Self-pay

## 2021-04-17 ENCOUNTER — Inpatient Hospital Stay (HOSPITAL_COMMUNITY): Payer: 59

## 2021-04-17 ENCOUNTER — Inpatient Hospital Stay (HOSPITAL_COMMUNITY): Payer: 59 | Admitting: Hematology

## 2021-04-17 VITALS — BP 120/71 | HR 83 | Temp 97.2°F | Resp 18

## 2021-04-17 VITALS — BP 107/82 | HR 104 | Temp 97.3°F | Resp 18 | Ht 65.0 in | Wt 157.4 lb

## 2021-04-17 DIAGNOSIS — C169 Malignant neoplasm of stomach, unspecified: Secondary | ICD-10-CM

## 2021-04-17 DIAGNOSIS — D702 Other drug-induced agranulocytosis: Secondary | ICD-10-CM

## 2021-04-17 LAB — COMPREHENSIVE METABOLIC PANEL
ALT: 11 U/L (ref 0–44)
AST: 15 U/L (ref 15–41)
Albumin: 3.9 g/dL (ref 3.5–5.0)
Alkaline Phosphatase: 87 U/L (ref 38–126)
Anion gap: 15 (ref 5–15)
BUN: 31 mg/dL — ABNORMAL HIGH (ref 8–23)
CO2: 15 mmol/L — ABNORMAL LOW (ref 22–32)
Calcium: 10.1 mg/dL (ref 8.9–10.3)
Chloride: 107 mmol/L (ref 98–111)
Creatinine, Ser: 1.72 mg/dL — ABNORMAL HIGH (ref 0.44–1.00)
GFR, Estimated: 33 mL/min — ABNORMAL LOW (ref >60)
Glucose, Bld: 177 mg/dL — ABNORMAL HIGH (ref 70–99)
Potassium: 4 mmol/L (ref 3.5–5.1)
Sodium: 137 mmol/L (ref 135–145)
Total Bilirubin: 0.5 mg/dL (ref 0.3–1.2)
Total Protein: 7.2 g/dL (ref 6.5–8.1)

## 2021-04-17 LAB — CBC WITH DIFFERENTIAL/PLATELET
Abs Immature Granulocytes: 0.45 10*3/uL — ABNORMAL HIGH (ref 0.00–0.07)
Basophils Absolute: 0.1 10*3/uL (ref 0.0–0.1)
Basophils Relative: 0 %
Eosinophils Absolute: 0 10*3/uL (ref 0.0–0.5)
Eosinophils Relative: 0 %
HCT: 30.7 % — ABNORMAL LOW (ref 36.0–46.0)
Hemoglobin: 9.6 g/dL — ABNORMAL LOW (ref 12.0–15.0)
Immature Granulocytes: 4 %
Lymphocytes Relative: 35 %
Lymphs Abs: 4.1 10*3/uL — ABNORMAL HIGH (ref 0.7–4.0)
MCH: 26.8 pg (ref 26.0–34.0)
MCHC: 31.3 g/dL (ref 30.0–36.0)
MCV: 85.8 fL (ref 80.0–100.0)
Monocytes Absolute: 0.8 10*3/uL (ref 0.1–1.0)
Monocytes Relative: 7 %
Neutro Abs: 6.1 10*3/uL (ref 1.7–7.7)
Neutrophils Relative %: 54 %
Platelets: 231 10*3/uL (ref 150–400)
RBC: 3.58 MIL/uL — ABNORMAL LOW (ref 3.87–5.11)
RDW: 20.4 % — ABNORMAL HIGH (ref 11.5–15.5)
WBC: 11.5 10*3/uL — ABNORMAL HIGH (ref 4.0–10.5)
nRBC: 0.5 % — ABNORMAL HIGH (ref 0.0–0.2)

## 2021-04-17 LAB — MAGNESIUM: Magnesium: 1.7 mg/dL (ref 1.7–2.4)

## 2021-04-17 MED ORDER — HEPARIN SOD (PORK) LOCK FLUSH 100 UNIT/ML IV SOLN
500.0000 [IU] | Freq: Once | INTRAVENOUS | Status: AC | PRN
  Administered 2021-04-17: 500 [IU]

## 2021-04-17 MED ORDER — POTASSIUM CHLORIDE IN NACL 20-0.9 MEQ/L-% IV SOLN
Freq: Once | INTRAVENOUS | Status: AC
  Filled 2021-04-17: qty 1000

## 2021-04-17 MED ORDER — SODIUM CHLORIDE 0.9% FLUSH
10.0000 mL | Freq: Once | INTRAVENOUS | Status: AC | PRN
  Administered 2021-04-17: 10 mL

## 2021-04-17 MED ORDER — MAGNESIUM SULFATE 2 GM/50ML IV SOLN
2.0000 g | Freq: Once | INTRAVENOUS | Status: AC
  Administered 2021-04-17: 2 g via INTRAVENOUS
  Filled 2021-04-17: qty 50

## 2021-04-17 NOTE — Patient Instructions (Signed)
[facility]  Discharge Instructions: Thank you for choosing [facility] to provide your oncology and hematology care.  If you have a lab appointment with the [facility], please come in thru the Main Entrance and check in at the main information desk.  Wear comfortable clothing and clothing appropriate for easy access to any Portacath or PICC line.   We strive to give you quality time with your provider. You may need to reschedule your appointment if you arrive late (15 or more minutes).  Arriving late affects you and other patients whose appointments are after yours.  Also, if you miss three or more appointments without notifying the office, you may be dismissed from the clinic at the providers discretion.      For prescription refill requests, have your pharmacy contact our office and allow 72 hours for refills to be completed.    Today you received the following House fluids. Dr. Delton Coombes would like for you to stop the scopolamine patch and switch to compazine to see if that helps decrease thick secretions in mouth and improve oral intake. Return as scheduled.   To help prevent nausea and vomiting after your treatment, we encourage you to take your nausea medication as directed.  BELOW ARE SYMPTOMS THAT SHOULD BE REPORTED IMMEDIATELY: *FEVER GREATER THAN 100.4 F (38 C) OR HIGHER *CHILLS OR SWEATING *NAUSEA AND VOMITING THAT IS NOT CONTROLLED WITH YOUR NAUSEA MEDICATION *UNUSUAL SHORTNESS OF BREATH *UNUSUAL BRUISING OR BLEEDING *URINARY PROBLEMS (pain or burning when urinating, or frequent urination) *BOWEL PROBLEMS (unusual diarrhea, constipation, pain near the anus) TENDERNESS IN MOUTH AND THROAT WITH OR WITHOUT PRESENCE OF ULCERS (sore throat, sores in mouth, or a toothache) UNUSUAL RASH, SWELLING OR PAIN  UNUSUAL VAGINAL DISCHARGE OR ITCHING   Items with * indicate a potential emergency and should be followed up as soon as possible or go to the  Emergency Department if any problems should occur.  Please show the CHEMOTHERAPY ALERT CARD or IMMUNOTHERAPY ALERT CARD at check-in to the Emergency Department and triage nurse.  Should you have questions after your visit or need to cancel or reschedule your appointment, please contact [facility] [phone]  and follow the prompts.  Office hours are 8:00 a.m. to 4:30 p.m. Monday - Friday. Please note that voicemails left after 4:00 p.m. may not be returned until the following business day.  We are closed weekends and major holidays. You have access to a nurse at all times for urgent questions. Please call the main number to the clinic [phone] and follow the prompts.  For any non-urgent questions, you may also contact your provider using MyChart. We now offer e-Visits for anyone 64 and older to request care online for non-urgent symptoms. For details visit [email or web address].   Also download the MyChart app! Go to the app store, search "MyChart", open the app, select [facility], and log in with your MyChart username and password.  Due to Covid, a mask is required upon entering the hospital/clinic. If you do not have a mask, one will be given to you upon arrival. For doctor visits, patients may have 1 support person aged 74 or older with them. For treatment visits, patients cannot have anyone with them due to current Covid guidelines and our immunocompromised population.

## 2021-04-17 NOTE — Progress Notes (Signed)
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 female, returns for routine follow-up and consideration for next cycle of chemotherapy. Hannah Knight was last seen on 04/02/2021.  Due for cycle #3 of FOLFOX today.   Overall, she tells me she has been feeling pretty well. She reports fatigue. She has lost 15 lbs since her last visit. She is taking Megace, and she stopped taking losartan. She reports watery diarrhea 30 minutes following drinking Ensure. She is drinking 3 Ensure daily. She does not have diarrhea if she does not drink Ensure. Her appetite is poor and she reports difficulty swallowing. She is drinking soup and broth as she is unable to swallow solid food.  She denies feeling food getting stuck after swallowing, and she has difficulty saliva in her mouth. She denies tingling/numbness. She continues to have cold sensitivity for 5 days following treatments. She reports occasional cough and fatigue. She denies abdominal pain.  Overall, she is not ready for next cycle of chemo today.   REVIEW OF SYSTEMS:  Review of Systems  Constitutional:  Positive for appetite change, fatigue and unexpected weight change (-15 lbs).  HENT:    Positive for trouble swallowing.   Respiratory:  Positive for cough and shortness of breath.   Cardiovascular:  Positive for palpitations.  Gastrointestinal:  Positive for diarrhea. Negative for abdominal pain.  All other systems reviewed and are negative.  PAST MEDICAL/SURGICAL HISTORY:  Past Medical History:  Diagnosis Date   Allergy    Arthritis    Diabetes mellitus    Graves disease    with radiation, now on Synthroid   Graves disease    Hypertension    Port-A-Cath in place 03/08/2021   Past Surgical History:  Procedure Laterality Date   BIOPSY  01/23/2021   Procedure: BIOPSY;  Surgeon: [name], DO;  [facility];  Service: Endoscopy;;   CHOLECYSTECTOMY     COLONOSCOPY  09/22/2007   SLF: moderate internal hemorrhoids, multiple 3-4 mm sessile polyps in splenic flexure, hyperplastic.    COLONOSCOPY  09/27/2002   [place]:Small external hemorrhoids/Six tiny   polyps ablated by cold biopsy from sigmoid colon   COLONOSCOPY N/A 06/29/2013   Moderate sized internal hemorrhoids. Sigmoid diverticulosis. Normal TI. Banding X 3.   COLONOSCOPY WITH PROPOFOL N/A 01/23/2021   Procedure: COLONOSCOPY WITH PROPOFOL;  Surgeon: [name], DO;  Location: [facility];  Service: Endoscopy;  Laterality: N/A;  8:00am   ESOPHAGOGASTRODUODENOSCOPY (EGD) WITH PROPOFOL N/A 01/23/2021   Procedure: ESOPHAGOGASTRODUODENOSCOPY (EGD) WITH PROPOFOL;  Surgeon: [name], DO;  [facility];  Service: Endoscopy;  Laterality: N/A;   HEMORRHOID BANDING  06/29/2013   Procedure: HEMORRHOID BANDING;  Surgeon: Danie Binder, MD;  Location: [facility];  Service: Endoscopy;;   IR IMAGING GUIDED PORT INSERTION  02/21/2021   IR US GUIDE BX ASP/DRAIN  02/21/2021   TUBAL LIGATION      SOCIAL HISTORY:  Social History   Socioeconomic History   Marital status: Legally Separated    Spouse name: Not on file   Number of children: Not on file   Years of education: Not on  file   Highest education level: Not on file  Occupational History   Occupation: Self-employed    Comment: sitter  Tobacco Use   Smoking status: Never   Smokeless tobacco: Never  Vaping Use   Vaping Use: Never used  Substance and Sexual Activity   Alcohol use: No   Drug use: No   Sexual activity: Not on file  Other Topics Concern   Not on file  Social History Narrative   Not on file   Social Determinants of Health   Financial Resource Strain: Low Risk    Difficulty of Paying Living Expenses: Not very hard  Food Insecurity: No Food Insecurity   Worried About Charity fundraiser in the Last Year: Never true   Ran Out of Food in the Last Year: Never true  Transportation Needs: No Transportation Needs   Lack of Transportation (Medical): No   Lack of Transportation (Non-Medical): No  Physical Activity: Not on file  Stress: No Stress Concern Present   Feeling of Stress : Only a little  Social Connections: Moderately Integrated   Frequency of Communication with Friends and Family: More than three times a week   Frequency of Social Gatherings with Friends and Family: More than three times a week   Attends Religious Services: More than 4 times per year   Active Member of [employer] or Organizations: Yes   Attends Music therapist: More than 4 times per year   Marital Status: Separated  Intimate Partner Violence: Not on file    FAMILY HISTORY:  Family History  Problem Relation Age of Onset   Colon cancer Father 13       deceased   Diabetes Mother    Heart disease Mother    Cancer - Lung Brother     CURRENT MEDICATIONS:  Current Outpatient Medications  Medication Sig Dispense Refill   acetaminophen (TYLENOL) 650 MG CR tablet Take 1,300 mg by mouth every 8 (eight) hours as needed for pain.     amLODipine (NORVASC) 5 MG tablet Take 5 mg by mouth every evening.     atorvastatin (LIPITOR) 10 MG tablet Take 1 tablet (10 mg total) by mouth daily. (Patient taking  differently: Take 10 mg by mouth at bedtime.) 90 tablet 3   fluorouracil CALGB 37628 2,400 mg/m2 in sodium chloride 0.9 % 150 mL Inject 2,400 mg/m2 into the vein over 48 hr.     FLUOROURACIL IV Inject into the vein every 14 (fourteen) days.     LEUCOVORIN CALCIUM IV Inject into the vein every 14 (fourteen) days.     levothyroxine (SYNTHROID) 112 MCG tablet TAKE 1 TABLET BY MOUTH EVERY MORNING BEFORE BREAKFAST 90 tablet 3   losartan (COZAAR) 100 MG tablet Take 1 tablet (100 mg total) by mouth daily. 90 tablet 3   magnesium oxide (MAG-OX) 400 (240 Mg) MG tablet Take 1 tablet (400 mg total) by mouth 2 (two) times daily. 60 tablet 3   megestrol (MEGACE) 400 MG/10ML suspension Take 10 mLs (400 mg total) by mouth  2 (two) times daily. 480 mL 2   metFORMIN (GLUCOPHAGE) 500 MG tablet Take 1 tablet (500 mg total) by mouth 2 (two) times daily with a meal. 120 tablet 5   OXALIPLATIN IV Inject into the vein every 14 (fourteen) days.     potassium chloride SA (KLOR-CON M) 20 MEQ tablet Take 2 tablets (40 mEq total) by mouth daily. Take 2 tablets every 3 hours today 03/12/2021, prior to bedtime,  then 2 tablets daily thereafter. 63 tablet 1   prochlorperazine (COMPAZINE) 10 MG tablet TAKE 1 TABLET(10 MG) BY MOUTH EVERY 6 HOURS AS NEEDED FOR NAUSEA OR VOMITING 60 tablet 4   scopolamine (TRANSDERM-SCOP) 1 MG/3DAYS Place 1 patch (1.5 mg total) onto the skin every 3 (three) days. 10 patch 12   No current facility-administered medications for this visit.    ALLERGIES:  Allergies  Allergen Reactions   Ace Inhibitors Anaphylaxis and Swelling    PHYSICAL EXAM:  Performance status (ECOG): 0 - Asymptomatic  Vitals:   04/17/21 0843  BP: 107/82  Pulse: (!) 104  Resp: 18  Temp: (!) 97.3 F (36.3 C)  SpO2: 100%   Wt Readings from Last 3 Encounters:  04/17/21 157 lb 6.4 oz (71.4 kg)  04/02/21 172 lb 12.8 oz (78.4 kg)  03/28/21 170 lb 3.2 oz (77.2 kg)   Physical Exam Vitals reviewed.  Constitutional:       Appearance: Normal appearance.  HENT:     Mouth/Throat:     Mouth: No oral lesions.  Cardiovascular:     Rate and Rhythm: Normal rate and regular rhythm.     Pulses: Normal pulses.     Heart sounds: Normal heart sounds.  Pulmonary:     Effort: Pulmonary effort is normal.     Breath sounds: Normal breath sounds.  Neurological:     General: No focal deficit present.     Mental Status: She is alert and oriented to person, place, and time.  Psychiatric:        Mood and Affect: Mood normal.        Behavior: Behavior normal.    LABORATORY DATA:  I have reviewed the labs as listed.  CBC Latest Ref Rng & Units 04/17/2021 04/03/2021 04/02/2021  WBC 4.0 - 10.5 K/uL 11.5(H) 13.5(H) 3.3(L)  Hemoglobin 12.0 - 15.0 g/dL 9.6(L) 9.9(L) 9.1(L)  Hematocrit 36.0 - 46.0 % 30.7(L) 30.3(L) 29.2(L)  Platelets 150 - 400 K/uL 231 228 197   CMP Latest Ref Rng & Units 04/17/2021 04/02/2021 03/28/2021  Glucose 70 - 99 mg/dL 177(H) 117(H) 113(H)  BUN 8 - 23 mg/dL 31(H) 15 14  Creatinine 0.44 - 1.00 mg/dL 1.72(H) 1.12(H) 1.65(H)  Sodium 135 - 145 mmol/L 137 136 137  Potassium 3.5 - 5.1 mmol/L 4.0 4.8 2.9(L)  Chloride 98 - 111 mmol/L 107 109 106  CO2 22 - 32 mmol/L 15(L) 19(L) 20(L)  Calcium 8.9 - 10.3 mg/dL 10.1 9.4 9.2  Total Protein 6.5 - 8.1 g/dL 7.2 6.8 6.7  Total Bilirubin 0.3 - 1.2 mg/dL 0.5 0.8 1.2  Alkaline Phos 38 - 126 U/L 87 79 67  AST 15 - 41 U/L 15 12(L) 14(L)  ALT 0 - 44 U/L _0 DIAGNOSTIC IMAGING:  I have independently reviewed the scans and discussed with the patient. No results found.   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 - 30 pound weight  loss in the last 6 months. - EGD on 01/23/2021 shows mucosal changes in the gastric body, circumferential mass-stricture - Pathology consistent with poorly differentiated carcinoma with signet ring cells - CT CAP on 02/06/2021  shows gastric wall thickening noted in the mid and distal stomach.  3.2 cm ill-defined lesion in the anterior right liver.  Abnormal lymph nodes in the gastrohepatic and hepatoduodenal ligaments with small right checks to diaphragmatic lymph nodes. - Liver biopsy on 02/21/2021 consistent with poorly differentiated carcinoma, gastric origin. - NGS ([facility]) on 01/23/2021-HER2 negative.  MSI-stable.  TMB-Low.  PD-L1 (28-8) negative.  Other targetable mutations negative.  ARID1A and T p53 pathogenic variants detected.     Social/family history: - Her sister lives with her.  She is independent of ADLs and IADLs.  She worked as a Programmer, applications and retired in 2021.  Non-smoker. - Father had colon cancer.  Brother had lung cancer.  One paternal cousin had lung cancer and another paternal cousin had prostate cancer.  Paternal uncle had lung cancer and paternal aunt had pancreatic cancer.   PLAN:  Stage IV (TxN1M1) poorly differentiated gastric carcinoma, HER2 negative: - She has lost 15 pounds since last treatment. - She also had elevated creatinine of 1.72. - Because of weight loss and renal insufficiency, recommend holding treatment today.  She will receive 1 L of fluid with electrolytes over 2 hours. - RTC 1 week with labs and possible treatment.  2.  Persistent nausea: - She reports complete improvement in nausea. - Would hold off on scopolamine patch because of difficulty swallowing.  We will continue Compazine as needed.  3.  Weight loss: - She has lost 15 pounds in the last 2 weeks after cycle 2. - She is taking Megace 400 mg twice daily. - She reports difficulty chewing solid foods.  She is mostly eating chicken/beef broth.  She is also drinking Ensure high-protein 2 to 3/day.  She reports that Ensure goes straight through her.  She was able to better tolerate Dillard [place]. - We will reach out to our dietitian to see if we can change her nutritional supplements. - Would also recommend  discontinuing scopolamine patch as it is likely that it is making her swallowing worse.  4.  Normocytic anemia: - Hemoglobin is 9.6 today.  Ferritin was 197 and percent saturation 16. - She has mild CKD. - We will consider parenteral iron therapy if there is any worsening.  5.  Hypokalemia/hypomagnesemia: - Continue potassium 20 mEq twice daily. - Continue magnesium 2 tablets twice daily.  Potassium and magnesium are normal today.  6.  Elevated creatinine: - We have held Cozaar a few weeks ago with improvement in creatinine. - However creatinine today is worse at 1.72 with BUN 31, likely from dehydration. - Would recommend 1 L of fluids over 2 hours.   Orders placed this encounter:  Orders Placed This Encounter  Procedures   CEA     Derek Jack, MD [place] [phone]   I, Thana Ates, am acting as a scribe for Dr. Derek Jack.  I, Derek Jack MD, have reviewed the above documentation for accuracy and completeness, and I agree with the above.

## 2021-04-17 NOTE — Patient Instructions (Addendum)
[place] at [facility] Discharge Instructions   You were seen and examined today by Dr. Delton Coombes.  He reviewed your lab results most of which are normal/stable. However, your kidney numbers have gone up.  Try to drink plenty of fluids, water preferably, to make sure you are staying hydrated. Try to drink at least 64 ounces of fluids per day. This will help improve your kidney function.   You have lost an additional 15 lbs since your last visit.  Continue to drink at least 3 cans of Ensure or similar.    We will hold treatment today. We will give you IV fluids today.  Return as scheduled for lab work, office visit, and treatment.    Thank you for choosing [place] at [facility] to provide your oncology and hematology care.  To afford each patient quality time with our provider, please arrive at least 15 minutes before your scheduled appointment time.   If you have a lab appointment with the [facility] please come in thru the Main Entrance and check in at the main information desk.  You need to re-schedule your appointment should you arrive 10 or more minutes late.  We strive to give you quality time with our providers, and arriving late affects you and other patients whose appointments are after yours.  Also, if you no show three or more times for appointments you may be dismissed from the clinic at the providers discretion.     Again, thank you for choosing [facility].  Our hope is that these requests will decrease the amount of time that you wait before being seen by our physicians.       _____________________________________________________________  Should you have questions after your visit to [facility], please contact our office at [phone] and follow the prompts.  Our office hours are 8:00 a.m. and 4:30 p.m. Monday - Friday.  Please note that voicemails left after 4:00 p.m. may not be returned  until the following business day.  We are closed weekends and major holidays.  You do have access to a nurse 24-7, just call the main number to the clinic [phone] and do not press any options, hold on the line and a nurse will answer the phone.    For prescription refill requests, have your pharmacy contact our office and allow 72 hours.    Due to Covid, you will need to wear a mask upon entering the hospital. If you do not have a mask, a mask will be given to you at the Main Entrance upon arrival. For doctor visits, patients may have 1 support person age 64 or older with them. For treatment visits, patients can not have anyone with them due to social distancing guidelines and our immunocompromised population.

## 2021-04-17 NOTE — Progress Notes (Signed)
Patient presents today for FOLFOX. No treatment today per Dr. Delton Coombes d/t weight loss and Ser. Creatinine of 1.72. RN received orders for house fluids over 2 hours. Per Dr. Delton Coombes scopolamine patch and switch to compazine to see if that helps decrease thick secretions in mouth and improve oral intake, patient verbalized understanding. Patient tolerated hydration therapy with no complaints voiced. Side effects with management reviewed with understanding verbalized. Port site clean and dry with no bruising or swelling noted at site. Good blood return noted before and after administration of therapy. Band aid applied. Patient left in satisfactory condition with VSS and no s/s of distress noted.

## 2021-04-18 ENCOUNTER — Encounter (HOSPITAL_COMMUNITY): Payer: 59

## 2021-04-18 LAB — CEA: CEA: 3.9 ng/mL (ref 0.0–4.7)

## 2021-04-19 ENCOUNTER — Encounter (HOSPITAL_COMMUNITY): Payer: 59

## 2021-04-24 ENCOUNTER — Inpatient Hospital Stay (HOSPITAL_COMMUNITY): Payer: 59

## 2021-04-24 ENCOUNTER — Other Ambulatory Visit: Payer: Self-pay

## 2021-04-24 ENCOUNTER — Inpatient Hospital Stay (HOSPITAL_COMMUNITY): Payer: 59 | Admitting: Hematology

## 2021-04-24 VITALS — BP 137/81 | HR 85 | Temp 97.4°F | Resp 18

## 2021-04-24 VITALS — BP 125/76 | HR 110 | Temp 98.4°F | Resp 20 | Ht 65.0 in | Wt 160.8 lb

## 2021-04-24 DIAGNOSIS — C169 Malignant neoplasm of stomach, unspecified: Secondary | ICD-10-CM | POA: Diagnosis not present

## 2021-04-24 DIAGNOSIS — D702 Other drug-induced agranulocytosis: Secondary | ICD-10-CM | POA: Diagnosis not present

## 2021-04-24 DIAGNOSIS — Z95828 Presence of other vascular implants and grafts: Secondary | ICD-10-CM

## 2021-04-24 LAB — CBC WITH DIFFERENTIAL/PLATELET
Abs Immature Granulocytes: 0.07 10*3/uL (ref 0.00–0.07)
Basophils Absolute: 0.1 10*3/uL (ref 0.0–0.1)
Basophils Relative: 0 %
Eosinophils Absolute: 0 10*3/uL (ref 0.0–0.5)
Eosinophils Relative: 0 %
HCT: 28.3 % — ABNORMAL LOW (ref 36.0–46.0)
Hemoglobin: 9 g/dL — ABNORMAL LOW (ref 12.0–15.0)
Immature Granulocytes: 1 %
Lymphocytes Relative: 23 %
Lymphs Abs: 3 10*3/uL (ref 0.7–4.0)
MCH: 27.7 pg (ref 26.0–34.0)
MCHC: 31.8 g/dL (ref 30.0–36.0)
MCV: 87.1 fL (ref 80.0–100.0)
Monocytes Absolute: 0.9 10*3/uL (ref 0.1–1.0)
Monocytes Relative: 7 %
Neutro Abs: 9 10*3/uL — ABNORMAL HIGH (ref 1.7–7.7)
Neutrophils Relative %: 69 %
Platelets: 247 10*3/uL (ref 150–400)
RBC: 3.25 MIL/uL — ABNORMAL LOW (ref 3.87–5.11)
RDW: 21.3 % — ABNORMAL HIGH (ref 11.5–15.5)
WBC: 13.1 10*3/uL — ABNORMAL HIGH (ref 4.0–10.5)
nRBC: 0 % (ref 0.0–0.2)

## 2021-04-24 LAB — COMPREHENSIVE METABOLIC PANEL
ALT: 11 U/L (ref 0–44)
AST: 14 U/L — ABNORMAL LOW (ref 15–41)
Albumin: 3.7 g/dL (ref 3.5–5.0)
Alkaline Phosphatase: 76 U/L (ref 38–126)
Anion gap: 8 (ref 5–15)
BUN: 28 mg/dL — ABNORMAL HIGH (ref 8–23)
CO2: 19 mmol/L — ABNORMAL LOW (ref 22–32)
Calcium: 9.8 mg/dL (ref 8.9–10.3)
Chloride: 109 mmol/L (ref 98–111)
Creatinine, Ser: 1.19 mg/dL — ABNORMAL HIGH (ref 0.44–1.00)
GFR, Estimated: 51 mL/min — ABNORMAL LOW (ref >60)
Glucose, Bld: 173 mg/dL — ABNORMAL HIGH (ref 70–99)
Potassium: 3.5 mmol/L (ref 3.5–5.1)
Sodium: 136 mmol/L (ref 135–145)
Total Bilirubin: 0.6 mg/dL (ref 0.3–1.2)
Total Protein: 7 g/dL (ref 6.5–8.1)

## 2021-04-24 LAB — MAGNESIUM: Magnesium: 1.7 mg/dL (ref 1.7–2.4)

## 2021-04-24 MED ORDER — DEXTROSE 5 % IV SOLN: Freq: Once | INTRAVENOUS | Status: DC

## 2021-04-24 MED ORDER — FLUOROURACIL CHEMO INJECTION 2.5 GM/50ML
320.0000 mg/m2 | Freq: Once | INTRAVENOUS | Status: AC
  Administered 2021-04-24: 600 mg via INTRAVENOUS
  Filled 2021-04-24: qty 12

## 2021-04-24 MED ORDER — SODIUM CHLORIDE 0.9 % IV SOLN
10.0000 mg | Freq: Once | INTRAVENOUS | Status: AC
  Administered 2021-04-24: 10 mg via INTRAVENOUS
  Filled 2021-04-24: qty 10

## 2021-04-24 MED ORDER — SODIUM CHLORIDE 0.9 % IV SOLN: Freq: Once | INTRAVENOUS | Status: AC

## 2021-04-24 MED ORDER — PALONOSETRON HCL INJECTION 0.25 MG/5ML
0.2500 mg | Freq: Once | INTRAVENOUS | Status: AC
  Administered 2021-04-24: 0.25 mg via INTRAVENOUS
  Filled 2021-04-24: qty 5

## 2021-04-24 MED ORDER — LEUCOVORIN CALCIUM INJECTION 350 MG
320.0000 mg/m2 | Freq: Once | INTRAVENOUS | Status: AC
  Administered 2021-04-24: 608 mg via INTRAVENOUS
  Filled 2021-04-24: qty 30.4

## 2021-04-24 MED ORDER — SODIUM CHLORIDE 0.9 % IV SOLN
1920.0000 mg/m2 | INTRAVENOUS | Status: DC
  Administered 2021-04-24: 3650 mg via INTRAVENOUS
  Filled 2021-04-24: qty 73

## 2021-04-24 MED ORDER — OXALIPLATIN CHEMO INJECTION 100 MG/20ML
68.0000 mg/m2 | Freq: Once | INTRAVENOUS | Status: AC
  Administered 2021-04-24: 130 mg via INTRAVENOUS
  Filled 2021-04-24: qty 20

## 2021-04-24 NOTE — Progress Notes (Signed)
Patient has been examined by Dr. Katragadda, and vital signs and labs have been reviewed. ANC, Creatinine, LFTs, hemoglobin, and platelets are within treatment parameters per M.D. - pt may proceed with treatment.    °

## 2021-04-24 NOTE — Progress Notes (Signed)
Patient presents today for treatment and follow up visit with Dr. Delton Coombes. Verbal order received from A. Anderson RN/ Dr. Delton Coombes to proceed with treatment pending CMP results. CMP within parameters for treatment. Patient to receive Venofer on pump d/c day. Authorization pending.   Venofer 300 mg scheduled for 04/26/2021 at pump d/c appointment. Plan under supportive therapy. Verbal order received to infuse 500 mls of normal saline over 1 hour prior to treatment. Dose reduction Oxaliplatin 20% 130 mg dose ,  68 mg/m2.  Treatment given today per MD orders. Tolerated infusion without adverse affects. Vital signs stable. No complaints at this time. 5FU pump infusing. RUN noted on screen and verified with patient prior to discharge. Discharged from clinic ambulatory in stable condition. Alert and oriented x 3. F/U with [facility] as scheduled.

## 2021-04-24 NOTE — Patient Instructions (Signed)
[place]  Discharge Instructions: Thank you for choosing [facility] to provide your oncology and hematology care.  If you have a lab appointment with the [facility], please come in thru the Main Entrance and check in at the main information desk.  Wear comfortable clothing and clothing appropriate for easy access to any Portacath or PICC line.   We strive to give you quality time with your provider. You may need to reschedule your appointment if you arrive late (15 or more minutes).  Arriving late affects you and other patients whose appointments are after yours.  Also, if you miss three or more appointments without notifying the office, you may be dismissed from the clinic at the providers discretion.      For prescription refill requests, have your pharmacy contact our office and allow 72 hours for refills to be completed.    Today you received the following chemotherapy and/or immunotherapy agents : Folfox, 5FU.       To help prevent nausea and vomiting after your treatment, we encourage you to take your nausea medication as directed.  BELOW ARE SYMPTOMS THAT SHOULD BE REPORTED IMMEDIATELY: *FEVER GREATER THAN 100.4 F (38 C) OR HIGHER *CHILLS OR SWEATING *NAUSEA AND VOMITING THAT IS NOT CONTROLLED WITH YOUR NAUSEA MEDICATION *UNUSUAL SHORTNESS OF BREATH *UNUSUAL BRUISING OR BLEEDING *URINARY PROBLEMS (pain or burning when urinating, or frequent urination) *BOWEL PROBLEMS (unusual diarrhea, constipation, pain near the anus) TENDERNESS IN MOUTH AND THROAT WITH OR WITHOUT PRESENCE OF ULCERS (sore throat, sores in mouth, or a toothache) UNUSUAL RASH, SWELLING OR PAIN  UNUSUAL VAGINAL DISCHARGE OR ITCHING   Items with * indicate a potential emergency and should be followed up as soon as possible or go to the Emergency Department if any problems should occur.  Please show the CHEMOTHERAPY ALERT CARD or IMMUNOTHERAPY ALERT CARD at check-in to the Emergency  Department and triage nurse.  Should you have questions after your visit or need to cancel or reschedule your appointment, please contact [facility] [phone]  and follow the prompts.  Office hours are 8:00 a.m. to 4:30 p.m. Monday - Friday. Please note that voicemails left after 4:00 p.m. may not be returned until the following business day.  We are closed weekends and major holidays. You have access to a nurse at all times for urgent questions. Please call the main number to the clinic [phone] and follow the prompts.  For any non-urgent questions, you may also contact your provider using MyChart. We now offer e-Visits for anyone 19 and older to request care online for non-urgent symptoms. For details visit [email or web address].   Also download the MyChart app! Go to the app store, search "MyChart", open the app, select [facility], and log in with your MyChart username and password. The chemotherapy medication bag should finish at 46 hours, 96 hours, or 7 days. For example, if your pump is scheduled for 46 hours and it was put on at 4:00 p.m., it should finish at 2:00 p.m. the day it is scheduled to come off regardless of your appointment time.     Estimated time to finish at 11:50.   If the display on your pump reads "Low Volume" and it is beeping, take the batteries out of the pump and come to the cancer center for it to be taken off.   If the pump alarms go off prior to the pump reading "Low Volume" then call [phone] and someone can assist  you.  If the plunger comes out and the chemotherapy medication is leaking out, please use your home chemo spill kit to clean up the spill. Do NOT use paper towels or other household products.  If you have problems or questions regarding your pump, please call either [phone] (24 hours a day) or the cancer center Monday-Friday 8:00 a.m.- 4:30 p.m. at the clinic number and we will assist you. If you are unable to get  assistance, then go to the nearest Emergency Department and ask the staff to contact the IV team for assistance. The chemotherapy medication bag should finish at 46 hours, 96 hours, or 7 days. For example, if your pump is scheduled for 46 hours and it was put on at 4:00 p.m., it should finish at 2:00 p.m. the day it is scheduled to come off regardless of your appointment time.     Estimated time to finish at 1150 am .   If the display on your pump reads "Low Volume" and it is beeping, take the batteries out of the pump and come to the cancer center for it to be taken off.   If the pump alarms go off prior to the pump reading "Low Volume" then call [phone] and someone can assist you.  If the plunger comes out and the chemotherapy medication is leaking out, please use your home chemo spill kit to clean up the spill. Do NOT use paper towels or other household products.  If you have problems or questions regarding your pump, please call either [phone] (24 hours a day) or the cancer center Monday-Friday 8:00 a.m.- 4:30 p.m. at the clinic number and we will assist you. If you are unable to get assistance, then go to the nearest Emergency Department and ask the staff to contact the IV team for assistance.   Due to Covid, a mask is required upon entering the hospital/clinic. If you do not have a mask, one will be given to you upon arrival. For doctor visits, patients may have 1 support person aged 64 or older with them. For treatment visits, patients cannot have anyone with them due to current Covid guidelines and our immunocompromised population.

## 2021-04-24 NOTE — Progress Notes (Signed)
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, a 66 y.o. female, returns for routine follow-up and consideration for next cycle of chemotherapy. [name] was last seen on 04/17/2021.  Due for cycle #3 of FOLFOX today.   Overall, she tells me she has been feeling pretty well. She gained 3 lbs since her last visit. Her swallowing has not improved, but she reports the secretions in her mouth have thinned slightly. She drinks 3 bottles of [employer] and 1 bottle of clear Ensure daily; she reports she is eating 25% of her baseline amount of solid foods. She continues to take Megace, and her nausea is well controlled with Compazine. She is taking magnesium BID and potassium once daily. She denies ankle swellings and abdominal pain. She denies tingling/numbness and diarrhea.   Overall, she feels ready for next cycle of chemo today.   REVIEW OF SYSTEMS:  Review of Systems  Constitutional:  Negative for appetite change and fatigue.  Respiratory:  Positive for cough.   Cardiovascular:  Negative for leg swelling.  Gastrointestinal:  Negative for abdominal pain, diarrhea and nausea (well controlled).  Neurological:  Negative for numbness.   Psychiatric/Behavioral:  The patient is nervous/anxious.   All other systems reviewed and are negative.  PAST MEDICAL/SURGICAL HISTORY:  Past Medical History:  Diagnosis Date   Allergy    Arthritis    Diabetes mellitus    Graves disease    with radiation, now on Synthroid   Graves disease    Hypertension    Port-A-Cath in place 03/08/2021   Past Surgical History:  Procedure Laterality Date   BIOPSY  01/23/2021   Procedure: BIOPSY;  Surgeon: Hannah Harman, DO;  Location: [facility];  Service: Endoscopy;;   CHOLECYSTECTOMY     COLONOSCOPY  09/22/2007   SLF: moderate internal hemorrhoids, multiple 3-4 mm sessile polyps in splenic flexure, hyperplastic.    COLONOSCOPY  09/27/2002   [place]:Small external hemorrhoids/Six tiny   polyps ablated by cold biopsy from sigmoid colon   COLONOSCOPY N/A 06/29/2013   Moderate sized internal hemorrhoids. Sigmoid diverticulosis. Normal TI. Banding X 3.   COLONOSCOPY WITH PROPOFOL N/A 01/23/2021   Procedure: COLONOSCOPY WITH PROPOFOL;  Surgeon: Hannah Harman, DO;  Location: [facility];  Service: Endoscopy;  Laterality: N/A;  8:00am   ESOPHAGOGASTRODUODENOSCOPY (EGD) WITH PROPOFOL N/A 01/23/2021   Procedure: ESOPHAGOGASTRODUODENOSCOPY (EGD) WITH PROPOFOL;  Surgeon: Hannah Harman, DO;  Location: [facility];  Service: Endoscopy;  Laterality: N/A;   HEMORRHOID BANDING  06/29/2013   Procedure: HEMORRHOID BANDING;  Surgeon: Hannah Binder, MD;  Location: [facility];  Service: Endoscopy;;   IR IMAGING GUIDED PORT INSERTION  02/21/2021   IR US  GUIDE BX ASP/DRAIN  02/21/2021   TUBAL LIGATION      SOCIAL HISTORY:  Social History   Socioeconomic History   Marital status: Legally Separated    Spouse name: Not on file   Number of children: Not on file   Years of education: Not on file   Highest education level: Not on file  Occupational History   Occupation: Self-employed    Comment: sitter  Tobacco Use   Smoking status:  Never   Smokeless tobacco: Never  Vaping Use   Vaping Use: Never used  Substance and Sexual Activity   Alcohol use: No   Drug use: No   Sexual activity: Not on file  Other Topics Concern   Not on file  Social History Narrative   Not on file   Social Determinants of Health   Financial Resource Strain: Low Risk    Difficulty of Paying Living Expenses: Not very hard  Food Insecurity: No Food Insecurity   Worried About Charity fundraiser in the Last Year: Never true   Ran Out of Food in the Last Year: Never true  Transportation Needs: No Transportation Needs   Lack of Transportation (Medical): No   Lack of Transportation (Non-Medical): No  Physical Activity: Not on file  Stress: No Stress Concern Present   Feeling of Stress : Only a little  Social Connections: Moderately Integrated   Frequency of Communication with Friends and Family: More than three times a week   Frequency of Social Gatherings with Friends and Family: More than three times a week   Attends Religious Services: More than 4 times per year   Active Member of [employer] or Organizations: Yes   Attends Music therapist: More than 4 times per year   Marital Status: Separated  Intimate Partner Violence: Not on file    FAMILY HISTORY:  Family History  Problem Relation Age of Onset   Colon cancer Father 76       deceased   Diabetes Mother    Heart disease Mother    Cancer - Lung Brother     CURRENT MEDICATIONS:  Current Outpatient Medications  Medication Sig Dispense Refill   acetaminophen (TYLENOL) 650 MG CR tablet Take 1,300 mg by mouth every 8 (eight) hours as needed for pain.     amLODipine (NORVASC) 5 MG tablet Take 5 mg by mouth every evening.     atorvastatin (LIPITOR) 10 MG tablet Take 1 tablet (10 mg total) by mouth daily. (Patient taking differently: Take 10 mg by mouth at bedtime.) 90 tablet 3   fluorouracil CALGB 47425 2,400 mg/m2 in sodium chloride 0.9 % 150 mL Inject 2,400 mg/m2 into the  vein over 48 hr.     FLUOROURACIL IV Inject into the vein every 14 (fourteen) days.     LEUCOVORIN CALCIUM IV Inject into the vein every 14 (fourteen) days.     levothyroxine (SYNTHROID) 112 MCG tablet TAKE 1 TABLET BY MOUTH EVERY MORNING BEFORE BREAKFAST 90 tablet 3   losartan (COZAAR) 100 MG tablet Take 1 tablet (100 mg total) by mouth daily. 90 tablet 3   magnesium oxide (MAG-OX) 400 (240 Mg) MG tablet Take 1 tablet (400 mg total) by mouth 2 (two) times daily. 60 tablet 3   megestrol (MEGACE) 400 MG/10ML suspension Take 10 mLs (400 mg total) by mouth 2 (two) times daily. 480 mL 2   metFORMIN (GLUCOPHAGE) 500 MG tablet Take 1 tablet (500 mg total) by mouth 2 (two) times daily  with a meal. 120 tablet 5   OXALIPLATIN IV Inject into the vein every 14 (fourteen) days.     potassium chloride SA (KLOR-CON M) 20 MEQ tablet Take 2 tablets (40 mEq total) by mouth daily. Take 2 tablets every 3 hours today 03/12/2021, prior to bedtime,  then 2 tablets daily thereafter. 63 tablet 1   scopolamine (TRANSDERM-SCOP) 1 MG/3DAYS Place 1 patch (1.5 mg total) onto the skin every 3 (three) days. 10 patch 12   prochlorperazine (COMPAZINE) 10 MG tablet TAKE 1 TABLET(10 MG) BY MOUTH EVERY 6 HOURS AS NEEDED FOR NAUSEA OR VOMITING (Patient not taking: Reported on 04/24/2021) 60 tablet 4   No current facility-administered medications for this visit.    ALLERGIES:  Allergies  Allergen Reactions   Ace Inhibitors Anaphylaxis and Swelling    PHYSICAL EXAM:  Performance status (ECOG): 0 - Asymptomatic  Vitals:   04/24/21 0809  BP: 125/76  Pulse: (!) 110  Resp: 20  Temp: 98.4 F (36.9 C)  SpO2: 100%   Wt Readings from Last 3 Encounters:  04/24/21 160 lb 12.8 oz (72.9 kg)  04/17/21 157 lb 6.4 oz (71.4 kg)  04/02/21 172 lb 12.8 oz (78.4 kg)   Physical Exam Vitals reviewed.  Constitutional:      Appearance: Normal appearance.  Cardiovascular:     Rate and Rhythm: Normal rate and regular rhythm.     Pulses:  Normal pulses.     Heart sounds: Normal heart sounds.  Pulmonary:     Effort: Pulmonary effort is normal.     Breath sounds: Normal breath sounds.  Neurological:     General: No focal deficit present.     Mental Status: She is alert and oriented to person, place, and time.  Psychiatric:        Mood and Affect: Mood normal.        Behavior: Behavior normal.    LABORATORY DATA:  I have reviewed the labs as listed.  CBC Latest Ref Rng & Units 04/24/2021 04/17/2021 04/03/2021  WBC 4.0 - 10.5 K/uL 13.1(H) 11.5(H) 13.5(H)  Hemoglobin 12.0 - 15.0 g/dL 9.0(L) 9.6(L) 9.9(L)  Hematocrit 36.0 - 46.0 % 28.3(L) 30.7(L) 30.3(L)  Platelets 150 - 400 K/uL 247 231 228   CMP Latest Ref Rng & Units 04/17/2021 04/02/2021 03/28/2021  Glucose 70 - 99 mg/dL 177(H) 117(H) 113(H)  BUN 8 - 23 mg/dL 31(H) 15 14  Creatinine 0.44 - 1.00 mg/dL 1.72(H) 1.12(H) 1.65(H)  Sodium 135 - 145 mmol/L 137 136 137  Potassium 3.5 - 5.1 mmol/L 4.0 4.8 2.9(L)  Chloride 98 - 111 mmol/L 107 109 106  CO2 22 - 32 mmol/L 15(L) 19(L) 20(L)  Calcium 8.9 - 10.3 mg/dL 10.1 9.4 9.2  Total Protein 6.5 - 8.1 g/dL 7.2 6.8 6.7  Total Bilirubin 0.3 - 1.2 mg/dL 0.5 0.8 1.2  Alkaline Phos 38 - 126 U/L 87 79 67  AST 15 - 41 U/L 15 12(L) 14(L)  ALT 0 - 44 U/L _0 DIAGNOSTIC IMAGING:  I have independently reviewed the scans and discussed with the patient. No results found.   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 - 30 pound weight loss in the last 6 months. - EGD on 01/23/2021 shows mucosal changes in the gastric body, circumferential mass-stricture - Pathology consistent with poorly differentiated carcinoma with signet ring cells - CT CAP on 02/06/2021 shows  gastric wall thickening noted in the mid and distal stomach.  3.2 cm ill-defined lesion in the anterior right liver.  Abnormal lymph nodes in the gastrohepatic and  hepatoduodenal ligaments with small right checks to diaphragmatic lymph nodes. - Liver biopsy on 02/21/2021 consistent with poorly differentiated carcinoma, gastric origin. - NGS ([facility]) on 01/23/2021-HER2 negative.  MSI-stable.  TMB-Low.  PD-L1 (28-8) negative.  Other targetable mutations negative.  ARID1A and T p53 pathogenic variants detected.     Social/family history: - Her sister lives with her.  She is independent of ADLs and IADLs.  She worked as a Programmer, applications and retired in 2021.  Non-smoker. - Father had colon cancer.  Brother had lung cancer.  One paternal cousin had lung cancer and another paternal cousin had prostate cancer.  Paternal uncle had lung cancer and paternal aunt had pancreatic cancer.   PLAN:  Stage IV (TxN1M1) poorly differentiated gastric carcinoma, HER2 negative: - We held her treatment last week due to weight loss and elevated creatinine. - Today creatinine normalized to 1.19.  White count is 13.1 and platelet count 247. - She does not report any tingling or numbness or diarrhea. - We will decrease dose by 20% today and proceed with cycle 3. - RTC 2 weeks for follow-up.  2.  Persistent nausea: - We have discontinued scopolamine patch on 04/17/2021.  She does not report any worsening of nausea. - Continue Compazine every 6 hours.  3.  Weight loss: - She has lost 15 pounds after cycle 2.  We held her treatment last week.  She has gained back about 3 pounds since last week. - She is drinking 3 cans of Kate from per day along with 1 can of Ensure clear.  She is eating 25% of solid foods. - Her swallowing ability also improved when we took her off of scopolamine patch.  4.  Normocytic anemia: - Hemoglobin is 9.0.  Ferritin is 197 and percent saturation is 16.  She has mild CKD and relative iron deficiency. - Recommend Venofer 300 mg-300 mg - 400 mg.  We will give her premeds-she has anaphylaxis to ACE inhibitors.  5.  Hypokalemia/hypomagnesemia: - Continue  potassium 20 mEq twice daily.  Potassium today is 3.5. - Continue magnesium 2 tablets twice daily.  Magnesium is 1.7.  6.  Elevated creatinine: - Cozaar on hold for the past few weeks.  Blood pressure is normal at 125/76. - Creatinine today improved to 1.19 after hydration last week. - We will give her 500 mL normal saline today.   Orders placed this encounter:  No orders of the defined types were placed in this encounter.    Derek Jack, MD [facility] [phone]   I, Thana Ates, am acting as a scribe for Dr. Derek Jack.  I, Derek Jack MD, have reviewed the above documentation for accuracy and completeness, and I agree with the above.

## 2021-04-24 NOTE — Progress Notes (Signed)
Patients port flushed without difficulty.  Good blood return noted with no bruising or swelling noted at site.  Stable during access and blood draw.  Patient to remain accessed for treatment. 

## 2021-04-24 NOTE — Patient Instructions (Signed)
[facility] at [facility] Discharge Instructions   You were seen and examined today by Dr. Delton Coombes.  He reviewed your lab work today which is normal/stable  Continue potassium and magnesium as prescribed.  We will proceed with your treatment today with a dose reduction of chemotherapy drugs.  You will also benefit from iron infusions.  Return as scheduled in 2 weeks.    Thank you for choosing [facility] at [facility] to provide your oncology and hematology care.  To afford each patient quality time with our provider, please arrive at least 15 minutes before your scheduled appointment time.   If you have a lab appointment with the [facility] please come in thru the Main Entrance and check in at the main information desk.  You need to re-schedule your appointment should you arrive 10 or more minutes late.  We strive to give you quality time with our providers, and arriving late affects you and other patients whose appointments are after yours.  Also, if you no show three or more times for appointments you may be dismissed from the clinic at the providers discretion.     Again, thank you for choosing [facility].  Our hope is that these requests will decrease the amount of time that you wait before being seen by our physicians.       _____________________________________________________________  Should you have questions after your visit to [facility], please contact our office at [phone] and follow the prompts.  Our office hours are 8:00 a.m. and 4:30 p.m. Monday - Friday.  Please note that voicemails left after 4:00 p.m. may not be returned until the following business day.  We are closed weekends and major holidays.  You do have access to a nurse 24-7, just call the main number to the clinic [phone] and do not press any options, hold on the line and a nurse will answer the phone.    For prescription  refill requests, have your pharmacy contact our office and allow 72 hours.    Due to Covid, you will need to wear a mask upon entering the hospital. If you do not have a mask, a mask will be given to you at the Main Entrance upon arrival. For doctor visits, patients may have 1 support person age 62 or older with them. For treatment visits, patients can not have anyone with them due to social distancing guidelines and our immunocompromised population.

## 2021-04-26 ENCOUNTER — Inpatient Hospital Stay (HOSPITAL_COMMUNITY): Payer: 59

## 2021-04-26 ENCOUNTER — Encounter (HOSPITAL_COMMUNITY): Payer: 59

## 2021-04-26 ENCOUNTER — Other Ambulatory Visit: Payer: Self-pay

## 2021-04-26 VITALS — BP 144/94 | HR 80 | Temp 98.8°F | Resp 18 | Wt 163.6 lb

## 2021-04-26 DIAGNOSIS — Z95828 Presence of other vascular implants and grafts: Secondary | ICD-10-CM

## 2021-04-26 DIAGNOSIS — C169 Malignant neoplasm of stomach, unspecified: Secondary | ICD-10-CM | POA: Diagnosis not present

## 2021-04-26 DIAGNOSIS — D702 Other drug-induced agranulocytosis: Secondary | ICD-10-CM

## 2021-04-26 MED ORDER — PEGFILGRASTIM-BMEZ 6 MG/0.6ML ~~LOC~~ SOSY
6.0000 mg | PREFILLED_SYRINGE | Freq: Once | SUBCUTANEOUS | Status: AC
  Administered 2021-04-26: 6 mg via SUBCUTANEOUS
  Filled 2021-04-26: qty 0.6

## 2021-04-26 MED ORDER — HEPARIN SOD (PORK) LOCK FLUSH 100 UNIT/ML IV SOLN
500.0000 [IU] | Freq: Once | INTRAVENOUS | Status: AC | PRN
  Administered 2021-04-26: 500 [IU]

## 2021-04-26 MED ORDER — SODIUM CHLORIDE 0.9% FLUSH
10.0000 mL | INTRAVENOUS | Status: DC | PRN
  Administered 2021-04-26: 10 mL

## 2021-04-26 MED ORDER — SODIUM CHLORIDE 0.9 % IV SOLN: Freq: Once | INTRAVENOUS | Status: AC

## 2021-04-26 MED ORDER — SODIUM CHLORIDE 0.9 % IV SOLN
300.0000 mg | Freq: Once | INTRAVENOUS | Status: AC
  Administered 2021-04-26: 300 mg via INTRAVENOUS
  Filled 2021-04-26: qty 300

## 2021-04-26 NOTE — Progress Notes (Signed)
Pt presents today for chemotherapy pump disconnection, Venofer IV iron infusion, and Ziextenzo injection per provider's order. Vital signs stable and pt voiced no new complaints at this time.  Venofer 300 mg and Ziextenzo injection given today per MD orders. Tolerated infusion without adverse affects. Vital signs stable. No complaints at this time. Discharged from clinic ambulatory in stable condition. Alert and oriented x 3. F/U with [facility] as scheduled.

## 2021-04-26 NOTE — Patient Instructions (Signed)
[place]  Discharge Instructions: Thank you for choosing [facility] to provide your oncology and hematology care.  If you have a lab appointment with the [facility], please come in thru the Main Entrance and check in at the main information desk.  Wear comfortable clothing and clothing appropriate for easy access to any Portacath or PICC line.   We strive to give you quality time with your provider. You may need to reschedule your appointment if you arrive late (15 or more minutes).  Arriving late affects you and other patients whose appointments are after yours.  Also, if you miss three or more appointments without notifying the office, you may be dismissed from the clinic at the providers discretion.      For prescription refill requests, have your pharmacy contact our office and allow 72 hours for refills to be completed.    Today you received Venofer, and Ziextenzo, and chemo pump d/c   BELOW ARE SYMPTOMS THAT SHOULD BE REPORTED IMMEDIATELY: *FEVER GREATER THAN 100.4 F (38 C) OR HIGHER *CHILLS OR SWEATING *NAUSEA AND VOMITING THAT IS NOT CONTROLLED WITH YOUR NAUSEA MEDICATION *UNUSUAL SHORTNESS OF BREATH *UNUSUAL BRUISING OR BLEEDING *URINARY PROBLEMS (pain or burning when urinating, or frequent urination) *BOWEL PROBLEMS (unusual diarrhea, constipation, pain near the anus) TENDERNESS IN MOUTH AND THROAT WITH OR WITHOUT PRESENCE OF ULCERS (sore throat, sores in mouth, or a toothache) UNUSUAL RASH, SWELLING OR PAIN  UNUSUAL VAGINAL DISCHARGE OR ITCHING   Items with * indicate a potential emergency and should be followed up as soon as possible or go to the Emergency Department if any problems should occur.  Please show the CHEMOTHERAPY ALERT CARD or IMMUNOTHERAPY ALERT CARD at check-in to the Emergency Department and triage nurse.  Should you have questions after your visit or need to cancel or reschedule your appointment, please contact [facility] [phone]  and follow the prompts.  Office hours are 8:00 a.m. to 4:30 p.m. Monday - Friday. Please note that voicemails left after 4:00 p.m. may not be returned until the following business day.  We are closed weekends and major holidays. You have access to a nurse at all times for urgent questions. Please call the main number to the clinic [phone] and follow the prompts.  For any non-urgent questions, you may also contact your provider using MyChart. We now offer e-Visits for anyone 45 and older to request care online for non-urgent symptoms. For details visit [email or web address].   Also download the MyChart app! Go to the app store, search "MyChart", open the app, select [facility], and log in with your MyChart username and password.  Due to Covid, a mask is required upon entering the hospital/clinic. If you do not have a mask, one will be given to you upon arrival. For doctor visits, patients may have 1 support person aged 14 or older with them. For treatment visits, patients cannot have anyone with them due to current Covid guidelines and our immunocompromised population.

## 2021-05-01 ENCOUNTER — Ambulatory Visit (HOSPITAL_COMMUNITY): Payer: 59

## 2021-05-01 ENCOUNTER — Other Ambulatory Visit: Payer: Self-pay

## 2021-05-01 ENCOUNTER — Other Ambulatory Visit (HOSPITAL_COMMUNITY): Payer: 59

## 2021-05-01 ENCOUNTER — Inpatient Hospital Stay (HOSPITAL_COMMUNITY): Payer: 59

## 2021-05-01 ENCOUNTER — Ambulatory Visit (HOSPITAL_COMMUNITY): Payer: 59 | Admitting: Hematology

## 2021-05-01 VITALS — BP 119/69 | HR 86 | Temp 98.7°F | Resp 18

## 2021-05-01 DIAGNOSIS — D702 Other drug-induced agranulocytosis: Secondary | ICD-10-CM

## 2021-05-01 DIAGNOSIS — C169 Malignant neoplasm of stomach, unspecified: Secondary | ICD-10-CM | POA: Diagnosis not present

## 2021-05-01 MED ORDER — SODIUM CHLORIDE 0.9 % IV SOLN: Freq: Once | INTRAVENOUS | Status: AC

## 2021-05-01 MED ORDER — HEPARIN SOD (PORK) LOCK FLUSH 100 UNIT/ML IV SOLN
500.0000 [IU] | Freq: Once | INTRAVENOUS | Status: AC
  Administered 2021-05-01: 500 [IU] via INTRAVENOUS

## 2021-05-01 MED ORDER — SODIUM CHLORIDE 0.9% FLUSH
10.0000 mL | INTRAVENOUS | Status: DC | PRN
  Administered 2021-05-01: 10 mL via INTRAVENOUS

## 2021-05-01 MED ORDER — SODIUM CHLORIDE 0.9 % IV SOLN
300.0000 mg | Freq: Once | INTRAVENOUS | Status: AC
  Administered 2021-05-01: 300 mg via INTRAVENOUS
  Filled 2021-05-01: qty 300

## 2021-05-01 NOTE — Patient Instructions (Signed)
[facility]  Discharge Instructions: Thank you for choosing [facility] to provide your oncology and hematology care.  If you have a lab appointment with the Cancer Center, please come in thru the Main Entrance and check in at the main information desk.  Wear comfortable clothing and clothing appropriate for easy access to any Portacath or PICC line.   We strive to give you quality time with your provider. You may need to reschedule your appointment if you arrive late (15 or more minutes).  Arriving late affects you and other patients whose appointments are after yours.  Also, if you miss three or more appointments without notifying the office, you may be dismissed from the clinic at the provider's discretion.      For prescription refill requests, have your pharmacy contact our office and allow 72 hours for refills to be completed.    Today you received Venofer IV iron infusion.     BELOW ARE SYMPTOMS THAT SHOULD BE REPORTED IMMEDIATELY: *FEVER GREATER THAN 100.4 F (38 C) OR HIGHER *CHILLS OR SWEATING *NAUSEA AND VOMITING THAT IS NOT CONTROLLED WITH YOUR NAUSEA MEDICATION *UNUSUAL SHORTNESS OF BREATH *UNUSUAL BRUISING OR BLEEDING *URINARY PROBLEMS (pain or burning when urinating, or frequent urination) *BOWEL PROBLEMS (unusual diarrhea, constipation, pain near the anus) TENDERNESS IN MOUTH AND THROAT WITH OR WITHOUT PRESENCE OF ULCERS (sore throat, sores in mouth, or a toothache) UNUSUAL RASH, SWELLING OR PAIN  UNUSUAL VAGINAL DISCHARGE OR ITCHING   Items with * indicate a potential emergency and should be followed up as soon as possible or go to the Emergency Department if any problems should occur.  Please show the CHEMOTHERAPY ALERT CARD or IMMUNOTHERAPY ALERT CARD at check-in to the Emergency Department and triage nurse.  Should you have questions after your visit or need to cancel or reschedule your appointment, please contact [facility]  [phone]  and follow the prompts.  Office hours are 8:00 a.m. to 4:30 p.m. Monday - Friday. Please note that voicemails left after 4:00 p.m. may not be returned until the following business day.  We are closed weekends and major holidays. You have access to a nurse at all times for urgent questions. Please call the main number to the clinic [phone] and follow the prompts.  For any non-urgent questions, you may also contact your provider using MyChart. We now offer e-Visits for anyone 18 and older to request care online for non-urgent symptoms. For details visit [email or web address].   Also download the MyChart app! Go to the app store, search "MyChart", open the app, select [name], and log in with your MyChart username and password.  Due to Covid, a mask is required upon entering the hospital/clinic. If you do not have a mask, one will be given to you upon arrival. For doctor visits, patients may have 1 support person aged 18 or older with them. For treatment visits, patients cannot have anyone with them due to current Covid guidelines and our immunocompromised population.  

## 2021-05-01 NOTE — Progress Notes (Signed)
Pt presents today for Venofer IV iron infusion per provider's order. Vital signs stable and pt voiced no new complaints at this time.  Venofer 300 mg given today per MD orders. Tolerated infusion without adverse affects. Vital signs stable. No complaints at this time. Discharged from clinic ambulatory in stable condition. Alert and oriented x 3. F/U with [facility] as scheduled.

## 2021-05-03 ENCOUNTER — Encounter (HOSPITAL_COMMUNITY): Payer: 59

## 2021-05-03 ENCOUNTER — Encounter (HOSPITAL_COMMUNITY): Payer: 59 | Admitting: Dietician

## 2021-05-08 ENCOUNTER — Inpatient Hospital Stay (HOSPITAL_COMMUNITY): Payer: 59

## 2021-05-08 ENCOUNTER — Other Ambulatory Visit: Payer: Self-pay

## 2021-05-08 ENCOUNTER — Inpatient Hospital Stay (HOSPITAL_COMMUNITY): Payer: 59 | Admitting: Hematology

## 2021-05-08 ENCOUNTER — Inpatient Hospital Stay (HOSPITAL_COMMUNITY): Payer: 59 | Attending: Hematology

## 2021-05-08 VITALS — BP 113/65 | HR 73 | Temp 96.9°F | Resp 17

## 2021-05-08 DIAGNOSIS — C169 Malignant neoplasm of stomach, unspecified: Secondary | ICD-10-CM | POA: Insufficient documentation

## 2021-05-08 DIAGNOSIS — R110 Nausea: Secondary | ICD-10-CM | POA: Diagnosis not present

## 2021-05-08 DIAGNOSIS — Z79899 Other long term (current) drug therapy: Secondary | ICD-10-CM | POA: Insufficient documentation

## 2021-05-08 DIAGNOSIS — Z5111 Encounter for antineoplastic chemotherapy: Secondary | ICD-10-CM | POA: Diagnosis present

## 2021-05-08 DIAGNOSIS — D702 Other drug-induced agranulocytosis: Secondary | ICD-10-CM | POA: Diagnosis not present

## 2021-05-08 DIAGNOSIS — D649 Anemia, unspecified: Secondary | ICD-10-CM | POA: Diagnosis not present

## 2021-05-08 DIAGNOSIS — Z95828 Presence of other vascular implants and grafts: Secondary | ICD-10-CM

## 2021-05-08 DIAGNOSIS — E876 Hypokalemia: Secondary | ICD-10-CM | POA: Diagnosis not present

## 2021-05-08 LAB — COMPREHENSIVE METABOLIC PANEL
ALT: 12 U/L (ref 0–44)
AST: 16 U/L (ref 15–41)
Albumin: 3.6 g/dL (ref 3.5–5.0)
Alkaline Phosphatase: 84 U/L (ref 38–126)
Anion gap: 8 (ref 5–15)
BUN: 20 mg/dL (ref 8–23)
CO2: 18 mmol/L — ABNORMAL LOW (ref 22–32)
Calcium: 9.4 mg/dL (ref 8.9–10.3)
Chloride: 113 mmol/L — ABNORMAL HIGH (ref 98–111)
Creatinine, Ser: 1.06 mg/dL — ABNORMAL HIGH (ref 0.44–1.00)
GFR, Estimated: 59 mL/min — ABNORMAL LOW (ref >60)
Glucose, Bld: 136 mg/dL — ABNORMAL HIGH (ref 70–99)
Potassium: 3.4 mmol/L — ABNORMAL LOW (ref 3.5–5.1)
Sodium: 139 mmol/L (ref 135–145)
Total Bilirubin: 0.4 mg/dL (ref 0.3–1.2)
Total Protein: 6.8 g/dL (ref 6.5–8.1)

## 2021-05-08 LAB — CBC WITH DIFFERENTIAL/PLATELET
Abs Immature Granulocytes: 0.03 10*3/uL (ref 0.00–0.07)
Basophils Absolute: 0 10*3/uL (ref 0.0–0.1)
Basophils Relative: 1 %
Eosinophils Absolute: 0.1 10*3/uL (ref 0.0–0.5)
Eosinophils Relative: 1 %
HCT: 27.6 % — ABNORMAL LOW (ref 36.0–46.0)
Hemoglobin: 8.8 g/dL — ABNORMAL LOW (ref 12.0–15.0)
Immature Granulocytes: 0 %
Lymphocytes Relative: 37 %
Lymphs Abs: 2.9 10*3/uL (ref 0.7–4.0)
MCH: 28.9 pg (ref 26.0–34.0)
MCHC: 31.9 g/dL (ref 30.0–36.0)
MCV: 90.8 fL (ref 80.0–100.0)
Monocytes Absolute: 0.7 10*3/uL (ref 0.1–1.0)
Monocytes Relative: 8 %
Neutro Abs: 4.1 10*3/uL (ref 1.7–7.7)
Neutrophils Relative %: 53 %
Platelets: 206 10*3/uL (ref 150–400)
RBC: 3.04 MIL/uL — ABNORMAL LOW (ref 3.87–5.11)
RDW: 22.3 % — ABNORMAL HIGH (ref 11.5–15.5)
WBC: 7.8 10*3/uL (ref 4.0–10.5)
nRBC: 0 % (ref 0.0–0.2)

## 2021-05-08 LAB — MAGNESIUM: Magnesium: 1.6 mg/dL — ABNORMAL LOW (ref 1.7–2.4)

## 2021-05-08 MED ORDER — SODIUM CHLORIDE 0.9% FLUSH: 10.0000 mL | INTRAVENOUS | Status: DC | PRN

## 2021-05-08 MED ORDER — HEPARIN SOD (PORK) LOCK FLUSH 100 UNIT/ML IV SOLN: 500.0000 [IU] | Freq: Once | INTRAVENOUS | Status: DC | PRN

## 2021-05-08 MED ORDER — SODIUM CHLORIDE 0.9 % IV SOLN
10.0000 mg | Freq: Once | INTRAVENOUS | Status: AC
  Administered 2021-05-08: 10 mg via INTRAVENOUS
  Filled 2021-05-08: qty 10

## 2021-05-08 MED ORDER — PALONOSETRON HCL INJECTION 0.25 MG/5ML
0.2500 mg | Freq: Once | INTRAVENOUS | Status: AC
  Administered 2021-05-08: 0.25 mg via INTRAVENOUS
  Filled 2021-05-08: qty 5

## 2021-05-08 MED ORDER — POTASSIUM CHLORIDE CRYS ER 20 MEQ PO TBCR
40.0000 meq | EXTENDED_RELEASE_TABLET | Freq: Once | ORAL | Status: AC
Start: 2021-05-08 — End: 2021-05-08
  Administered 2021-05-08: 40 meq via ORAL
  Filled 2021-05-08: qty 2

## 2021-05-08 MED ORDER — FLUOROURACIL CHEMO INJECTION 2.5 GM/50ML
320.0000 mg/m2 | Freq: Once | INTRAVENOUS | Status: AC
  Administered 2021-05-08: 600 mg via INTRAVENOUS
  Filled 2021-05-08: qty 12

## 2021-05-08 MED ORDER — MAGNESIUM SULFATE 2 GM/50ML IV SOLN
2.0000 g | Freq: Once | INTRAVENOUS | Status: AC
  Administered 2021-05-08: 2 g via INTRAVENOUS
  Filled 2021-05-08: qty 50

## 2021-05-08 MED ORDER — OXALIPLATIN CHEMO INJECTION 100 MG/20ML
68.0000 mg/m2 | Freq: Once | INTRAVENOUS | Status: AC
  Administered 2021-05-08: 130 mg via INTRAVENOUS
  Filled 2021-05-08: qty 20

## 2021-05-08 MED ORDER — DEXTROSE 5 % IV SOLN: Freq: Once | INTRAVENOUS | Status: AC

## 2021-05-08 MED ORDER — SODIUM CHLORIDE 0.9 % IV SOLN
1920.0000 mg/m2 | INTRAVENOUS | Status: DC
  Administered 2021-05-08: 3650 mg via INTRAVENOUS
  Filled 2021-05-08: qty 73

## 2021-05-08 MED ORDER — LEUCOVORIN CALCIUM INJECTION 350 MG
320.0000 mg/m2 | Freq: Once | INTRAVENOUS | Status: AC
  Administered 2021-05-08: 608 mg via INTRAVENOUS
  Filled 2021-05-08: qty 30.4

## 2021-05-08 NOTE — Patient Instructions (Signed)
[place] at [facility] ?Discharge Instructions ? ? ?You were seen and examined today by Dr. Delton Coombes. ? ?He reviewed your lab work which is normal/stable. ? ?We will proceed with your treatment today. ? ?We will give your last iron infusion when you come to have the pump off. ? ?Return as scheduled in 2 weeks.  ? ? ?Thank you for choosing [place] at [facility] to provide your oncology and hematology care.  To afford each patient quality time with our provider, please arrive at least 15 minutes before your scheduled appointment time.  ? ?If you have a lab appointment with the [facility] please come in thru the Main Entrance and check in at the main information desk. ? ?You need to re-schedule your appointment should you arrive 10 or more minutes late.  We strive to give you quality time with our providers, and arriving late affects you and other patients whose appointments are after yours.  Also, if you no show three or more times for appointments you may be dismissed from the clinic at the providers discretion.     ?Again, thank you for choosing [facility].  Our hope is that these requests will decrease the amount of time that you wait before being seen by our physicians.       ?_____________________________________________________________ ? ?Should you have questions after your visit to [facility], please contact our office at [phone] and follow the prompts.  Our office hours are 8:00 a.m. and 4:30 p.m. Monday - Friday.  Please note that voicemails left after 4:00 p.m. may not be returned until the following business day.  We are closed weekends and major holidays.  You do have access to a nurse 24-7, just call the main number to the clinic [phone] and do not press any options, hold on the line and a nurse will answer the phone.   ? ?For prescription refill requests, have your pharmacy contact our office and allow 72  hours.   ? ?Due to Covid, you will need to wear a mask upon entering the hospital. If you do not have a mask, a mask will be given to you at the Main Entrance upon arrival. For doctor visits, patients may have 1 support person age 14 or older with them. For treatment visits, patients can not have anyone with them due to social distancing guidelines and our immunocompromised population.  ? ?   ?

## 2021-05-08 NOTE — Patient Instructions (Signed)
[place] [facility]  Discharge Instructions: ?Thank you for choosing [name] to provide your oncology and hematology care.  ?If you have a lab appointment with the [facility], please come in thru the Main Entrance and check in at the main information desk. ? ?Wear comfortable clothing and clothing appropriate for easy access to any Portacath or PICC line.  ? ?We strive to give you quality time with your provider. You may need to reschedule your appointment if you arrive late (15 or more minutes).  Arriving late affects you and other patients whose appointments are after yours.  Also, if you miss three or more appointments without notifying the office, you may be dismissed from the clinic at the provider?s discretion.    ?  ?For prescription refill requests, have your pharmacy contact our office and allow 72 hours for refills to be completed.   ? ?Today you received the following chemotherapy and/or immunotherapy agents Folfox with pump start    ?  ?To help prevent nausea and vomiting after your treatment, we encourage you to take your nausea medication as directed. ? ?BELOW ARE SYMPTOMS THAT SHOULD BE REPORTED IMMEDIATELY: ?*FEVER GREATER THAN 100.4 F (38 ?C) OR HIGHER ?*CHILLS OR SWEATING ?*NAUSEA AND VOMITING THAT IS NOT CONTROLLED WITH YOUR NAUSEA MEDICATION ?*UNUSUAL SHORTNESS OF BREATH ?*UNUSUAL BRUISING OR BLEEDING ?*URINARY PROBLEMS (pain or burning when urinating, or frequent urination) ?*BOWEL PROBLEMS (unusual diarrhea, constipation, pain near the anus) ?TENDERNESS IN MOUTH AND THROAT WITH OR WITHOUT PRESENCE OF ULCERS (sore throat, sores in mouth, or a toothache) ?UNUSUAL RASH, SWELLING OR PAIN  ?UNUSUAL VAGINAL DISCHARGE OR ITCHING  ? ?Items with * indicate a potential emergency and should be followed up as soon as possible or go to the Emergency Department if any problems should occur. ? ?Please show the CHEMOTHERAPY ALERT CARD or IMMUNOTHERAPY ALERT CARD at check-in to the  Emergency Department and triage nurse. ? ?Should you have questions after your visit or need to cancel or reschedule your appointment, please contact [facility] [phone]  and follow the prompts.  Office hours are 8:00 a.m. to 4:30 p.m. Monday - Friday. Please note that voicemails left after 4:00 p.m. may not be returned until the following business day.  We are closed weekends and major holidays. You have access to a nurse at all times for urgent questions. Please call the main number to the clinic [phone] and follow the prompts. ? ?For any non-urgent questions, you may also contact your provider using MyChart. We now offer e-Visits for anyone 9 and older to request care online for non-urgent symptoms. For details visit [email or web address]. ?  ?Also download the MyChart app! Go to the app store, search "MyChart", open the app, select [place], and log in with your MyChart username and password. ? ?Due to Covid, a mask is required upon entering the hospital/clinic. If you do not have a mask, one will be given to you upon arrival. For doctor visits, patients may have 1 support person aged 23 or older with them. For treatment visits, patients cannot have anyone with them due to current Covid guidelines and our immunocompromised population.  ?

## 2021-05-08 NOTE — Progress Notes (Signed)
Patient has been examined by Dr. Delton Coombes, and vital signs and labs have been reviewed. ANC, Creatinine, LFTs, hemoglobin, and platelets are within treatment parameters per M.D. - pt may proceed with treatment w/ FOLFOX 20% dose reduction.  ?

## 2021-05-08 NOTE — Progress Notes (Signed)
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, a 66 y.o. female, returns for routine follow-up and consideration for next cycle of chemotherapy. Morgan was last seen on 04/24/2021.  Due for cycle #4 of FOLFOX today.   Overall, she tells me she has been feeling pretty well. She reports is eating well, and her weight is stable. Her nausea is well controlled with Compazine. She is drinking 1 bottle of [employer] and 2 bottle of Ensure along with 3 small meals daily. She continues to take magnesium and potassium BID. She denies tingling/numbness. She reports improved energy following her previous iron infusion.   Overall, she feels ready for next cycle of chemo today.   REVIEW OF SYSTEMS:  Review of Systems  Constitutional:  Negative for appetite change, fatigue and unexpected weight change.  Gastrointestinal:  Negative for nausea (controlled).  Neurological:  Negative for numbness.  All other systems reviewed and are negative.  PAST MEDICAL/SURGICAL HISTORY:  Past Medical History:  Diagnosis Date   Allergy    Arthritis    Diabetes mellitus    Graves disease    with radiation, now on Synthroid   Graves disease    Hypertension     Port-A-Cath in place 03/08/2021   Past Surgical History:  Procedure Laterality Date   BIOPSY  01/23/2021   Procedure: BIOPSY;  Surgeon: [name], DO;  [facility]: [facility];  Service: Endoscopy;;   CHOLECYSTECTOMY     COLONOSCOPY  09/22/2007   SLF: moderate internal hemorrhoids, multiple 3-4 mm sessile polyps in splenic flexure, hyperplastic.    COLONOSCOPY  09/27/2002   Rehman:Small external hemorrhoids/Six tiny   polyps ablated by cold biopsy from sigmoid colon   COLONOSCOPY N/A 06/29/2013   Moderate sized internal hemorrhoids. Sigmoid diverticulosis. Normal TI. Banding X 3.   COLONOSCOPY WITH PROPOFOL N/A 01/23/2021   Procedure: COLONOSCOPY WITH PROPOFOL;  Surgeon: [name], DO;  Location: [facility];  Service: Endoscopy;  Laterality: N/A;  8:00am   ESOPHAGOGASTRODUODENOSCOPY (EGD) WITH PROPOFOL N/A 01/23/2021   Procedure: ESOPHAGOGASTRODUODENOSCOPY (EGD) WITH PROPOFOL;  Surgeon: [name], DO;  Location: [facility];  Service: Endoscopy;  Laterality: N/A;   HEMORRHOID BANDING  06/29/2013   Procedure: HEMORRHOID BANDING;  Surgeon: Danie Binder, MD;  Location: [facility];  Service: Endoscopy;;   IR IMAGING GUIDED PORT INSERTION  02/21/2021   IR US GUIDE BX ASP/DRAIN  02/21/2021   TUBAL LIGATION      SOCIAL HISTORY:  Social History   Socioeconomic History   Marital status: Legally Separated    Spouse name: Not on file   Number of children: Not on file   Years of education: Not on file  Highest education level: Not on file  Occupational History   Occupation: Self-employed    CommentQuarry manager  Tobacco Use   Smoking status: Never   Smokeless tobacco: Never  Vaping Use   Vaping Use: Never used  Substance and Sexual Activity   Alcohol use: No   Drug use: No   Sexual activity: Not on file  Other Topics Concern   Not on file  Social History Narrative   Not on file   Social Determinants of Health   Financial Resource Strain: Low Risk     Difficulty of Paying Living Expenses: Not very hard  Food Insecurity: No Food Insecurity   Worried About Running Out of Food in the Last Year: Never true   Ran Out of Food in the Last Year: Never true  Transportation Needs: No Transportation Needs   Lack of Transportation (Medical): No   Lack of Transportation (Non-Medical): No  Physical Activity: Not on file  Stress: No Stress Concern Present   Feeling of Stress : Only a little  Social Connections: Moderately Integrated   Frequency of Communication with Friends and Family: More than three times a week   Frequency of Social Gatherings with Friends and Family: More than three times a week   Attends Religious Services: More than 4 times per year   Active Member of [employer] or Organizations: Yes   Attends Music therapist: More than 4 times per year   Marital Status: Separated  Intimate Partner Violence: Not on file    FAMILY HISTORY:  Family History  Problem Relation Age of Onset   Colon cancer Father 66       deceased   Diabetes Mother    Heart disease Mother    Cancer - Lung Brother     CURRENT MEDICATIONS:  Current Outpatient Medications  Medication Sig Dispense Refill   acetaminophen (TYLENOL) 650 MG CR tablet Take 1,300 mg by mouth every 8 (eight) hours as needed for pain.     amLODipine (NORVASC) 5 MG tablet Take 5 mg by mouth every evening.     atorvastatin (LIPITOR) 10 MG tablet Take 1 tablet (10 mg total) by mouth daily. (Patient taking differently: Take 10 mg by mouth at bedtime.) 90 tablet 3   fluorouracil CALGB 23557 2,400 mg/m2 in sodium chloride 0.9 % 150 mL Inject 2,400 mg/m2 into the vein over 48 hr.     FLUOROURACIL IV Inject into the vein every 14 (fourteen) days.     LEUCOVORIN CALCIUM IV Inject into the vein every 14 (fourteen) days.     levothyroxine (SYNTHROID) 112 MCG tablet TAKE 1 TABLET BY MOUTH EVERY MORNING BEFORE BREAKFAST 90 tablet 3   losartan (COZAAR) 100 MG tablet Take 1 tablet (100  mg total) by mouth daily. 90 tablet 3   magnesium oxide (MAG-OX) 400 (240 Mg) MG tablet Take 1 tablet (400 mg total) by mouth 2 (two) times daily. 60 tablet 3   megestrol (MEGACE) 400 MG/10ML suspension Take 10 mLs (400 mg total) by mouth 2 (two) times daily. 480 mL 2   metFORMIN (GLUCOPHAGE) 500 MG tablet Take 1 tablet (500 mg total) by mouth 2 (two) times daily with a meal. 120 tablet 5   OXALIPLATIN IV Inject into the vein every 14 (fourteen) days.     potassium chloride SA (KLOR-CON M) 20 MEQ tablet Take 2 tablets (40 mEq total) by mouth daily. Take 2 tablets every 3 hours today 03/12/2021, prior to bedtime,  then 2 tablets daily  thereafter. 63 tablet 1   prochlorperazine (COMPAZINE) 10 MG tablet TAKE 1 TABLET(10 MG) BY MOUTH EVERY 6 HOURS AS NEEDED FOR NAUSEA OR VOMITING 60 tablet 4   scopolamine (TRANSDERM-SCOP) 1 MG/3DAYS Place 1 patch (1.5 mg total) onto the skin every 3 (three) days. 10 patch 12   No current facility-administered medications for this visit.    ALLERGIES:  Allergies  Allergen Reactions   Ace Inhibitors Anaphylaxis and Swelling    PHYSICAL EXAM:  Performance status (ECOG): 0 - Asymptomatic  There were no vitals filed for this visit. Wt Readings from Last 3 Encounters:  04/26/21 163 lb 9.3 oz (74.2 kg)  04/24/21 160 lb 12.8 oz (72.9 kg)  04/17/21 157 lb 6.4 oz (71.4 kg)   Physical Exam Vitals reviewed.  Constitutional:      Appearance: Normal appearance.  Cardiovascular:     Rate and Rhythm: Normal rate and regular rhythm.     Pulses: Normal pulses.     Heart sounds: Normal heart sounds.  Pulmonary:     Effort: Pulmonary effort is normal.     Breath sounds: Normal breath sounds.  Musculoskeletal:     Right lower leg: No edema.     Left lower leg: No edema.  Neurological:     General: No focal deficit present.     Mental Status: She is alert and oriented to person, place, and time.  Psychiatric:        Mood and Affect: Mood normal.        Behavior:  Behavior normal.    LABORATORY DATA:  I have reviewed the labs as listed.  CBC Latest Ref Rng & Units 05/08/2021 04/24/2021 04/17/2021  WBC 4.0 - 10.5 K/uL 7.8 13.1(H) 11.5(H)  Hemoglobin 12.0 - 15.0 g/dL 8.8(L) 9.0(L) 9.6(L)  Hematocrit 36.0 - 46.0 % 27.6(L) 28.3(L) 30.7(L)  Platelets 150 - 400 K/uL 206 247 231   CMP Latest Ref Rng & Units 04/24/2021 04/17/2021 04/02/2021  Glucose 70 - 99 mg/dL 173(H) 177(H) 117(H)  BUN 8 - 23 mg/dL 28(H) 31(H) 15  Creatinine 0.44 - 1.00 mg/dL 1.19(H) 1.72(H) 1.12(H)  Sodium 135 - 145 mmol/L 136 137 136  Potassium 3.5 - 5.1 mmol/L 3.5 4.0 4.8  Chloride 98 - 111 mmol/L 109 107 109  CO2 22 - 32 mmol/L 19(L) 15(L) 19(L)  Calcium 8.9 - 10.3 mg/dL 9.8 10.1 9.4  Total Protein 6.5 - 8.1 g/dL 7.0 7.2 6.8  Total Bilirubin 0.3 - 1.2 mg/dL 0.6 0.5 0.8  Alkaline Phos 38 - 126 U/L 76 87 79  AST 15 - 41 U/L 14(L) 15 12(L)  ALT 0 - 44 U/L _0 DIAGNOSTIC IMAGING:  I have independently reviewed the scans and discussed with the patient. No results found.   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 - 30 pound weight loss in the last 6 months. - EGD on 01/23/2021 shows mucosal changes in the gastric body, circumferential mass-stricture - Pathology consistent with poorly differentiated carcinoma with signet ring cells - CT CAP on 02/06/2021 shows gastric wall thickening noted in the mid and distal stomach.  3.2 cm ill-defined lesion in the anterior right liver.  Abnormal lymph nodes in the gastrohepatic and hepatoduodenal ligaments with small right checks to diaphragmatic lymph nodes. - Liver biopsy on 02/21/2021 consistent with poorly differentiated carcinoma, gastric origin. - NGS ([facility]) on 01/23/2021-HER2 negative.  MSI-stable.  TMB-Low.  PD-L1 (28-8) negative.  Other targetable mutations negative.  ARID1A and T p53 pathogenic variants detected.      Social/family history: - Her sister lives with her.  She is independent of ADLs and IADLs.  She worked as a Programmer, applications and retired in 2021.  Non-smoker. - Father had colon cancer.  Brother had lung cancer.  One paternal cousin had lung cancer and another paternal cousin had prostate cancer.  Paternal uncle had lung cancer and paternal aunt had pancreatic cancer.   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 - Her weight has been stable. - Reviewed labs today which showed creatinine elevated at 1.06.  LFTs are normal.  CBC was grossly normal with normocytic anemia with hemoglobin 8.8.  CEA was 3.9. - She will proceed with next cycle today with 20% dose reduction.  RTC 2 weeks.  2.  Persistent nausea: -Continue Compazine every 6 hours which is helping.  We have discontinued scopolamine patch due to dry mouth.  3.  Weight loss: -Her weight has stabilized.  She is drinking 1 can of [place] from 1-2 ensures per day.  She is eating some solid foods.  4.  Normocytic anemia: -She has received 2 infusions of Venofer.  She will receive third infusion 400 mg on Thursday.  5.  Hypokalemia/hypomagnesemia: -Continue potassium 20 mEq twice daily.  Continue magnesium 2 tablets twice daily.  6.  Elevated creatinine: -Cozaar on hold for the past few weeks.  The blood pressure today is 133/76. - Creatinine today is 1.06.  Encouraged oral hydration 2 to 3 L/day.   Orders placed this encounter:  No orders of the defined types were placed in this encounter.    Derek Jack, MD [facility] [phone]   I, Thana Ates, am acting as a scribe for Dr. Derek Jack.  I, Derek Jack MD, have reviewed the above documentation for accuracy and completeness, and I agree with the above.

## 2021-05-08 NOTE — Progress Notes (Signed)
Patient presents today for Folfox infusion and 5FU pump start.  Vital signs and Labs within parameters for treatment.   ? ?Message received from Anastasio Champion RN/Dr [place] patient okay for treatment.   ? ?Per protocol patient to receive 40 mEq of PO Potassium and 2 grams of IV Magnesium for Potassium of 3.4 and Magnesium of 1.6. ? ?Folfox with pump start given today per MD orders.  Stable during infusion without adverse affects. Pump connected and verified RUN on the screen with the patient.  Vital signs stable.  No complaints at this time.  Discharge from clinic ambulatory in stable condition.  Alert and oriented X 3.  Follow up with [facility] as scheduled.  ?

## 2021-05-10 ENCOUNTER — Inpatient Hospital Stay (HOSPITAL_COMMUNITY): Payer: 59

## 2021-05-10 ENCOUNTER — Encounter (HOSPITAL_COMMUNITY): Payer: Self-pay

## 2021-05-10 ENCOUNTER — Other Ambulatory Visit: Payer: Self-pay

## 2021-05-10 VITALS — BP 117/73 | HR 73 | Temp 97.8°F | Resp 18

## 2021-05-10 DIAGNOSIS — C169 Malignant neoplasm of stomach, unspecified: Secondary | ICD-10-CM

## 2021-05-10 DIAGNOSIS — D702 Other drug-induced agranulocytosis: Secondary | ICD-10-CM

## 2021-05-10 MED ORDER — PEGFILGRASTIM-BMEZ 6 MG/0.6ML ~~LOC~~ SOSY
6.0000 mg | PREFILLED_SYRINGE | Freq: Once | SUBCUTANEOUS | Status: AC
  Administered 2021-05-10: 12:00:00 6 mg via SUBCUTANEOUS
  Filled 2021-05-10: qty 0.6

## 2021-05-10 MED ORDER — SODIUM CHLORIDE 0.9 % IV SOLN
400.0000 mg | Freq: Once | INTRAVENOUS | Status: AC
  Administered 2021-05-10: 400 mg via INTRAVENOUS
  Filled 2021-05-10: qty 20

## 2021-05-10 MED ORDER — SODIUM CHLORIDE 0.9% FLUSH
10.0000 mL | INTRAVENOUS | Status: DC | PRN
  Administered 2021-05-10: 12:00:00 10 mL

## 2021-05-10 MED ORDER — SODIUM CHLORIDE 0.9 % IV SOLN: Freq: Once | INTRAVENOUS | Status: AC

## 2021-05-10 MED ORDER — HEPARIN SOD (PORK) LOCK FLUSH 100 UNIT/ML IV SOLN
500.0000 [IU] | Freq: Once | INTRAVENOUS | Status: AC | PRN
  Administered 2021-05-10: 15:00:00 500 [IU]

## 2021-05-10 NOTE — Progress Notes (Signed)
Patient presents for pump d/c and Venofer 400 mg today. Patient has no complaints of any side effects from treatment. Patient has no complaints today. Patient states she was nauseas last night and took her Compazine and the nausea resolved. No nausea today.  ?

## 2021-05-10 NOTE — Patient Instructions (Signed)
[facility]  Discharge Instructions: Thank you for choosing [facility] to provide your oncology and hematology care.  If you have a lab appointment with the Cancer Center, please come in thru the Main Entrance and check in at the main information desk.  Wear comfortable clothing and clothing appropriate for easy access to any Portacath or PICC line.   We strive to give you quality time with your provider. You may need to reschedule your appointment if you arrive late (15 or more minutes).  Arriving late affects you and other patients whose appointments are after yours.  Also, if you miss three or more appointments without notifying the office, you may be dismissed from the clinic at the provider's discretion.      For prescription refill requests, have your pharmacy contact our office and allow 72 hours for refills to be completed.        To help prevent nausea and vomiting after your treatment, we encourage you to take your nausea medication as directed.  BELOW ARE SYMPTOMS THAT SHOULD BE REPORTED IMMEDIATELY: *FEVER GREATER THAN 100.4 F (38 C) OR HIGHER *CHILLS OR SWEATING *NAUSEA AND VOMITING THAT IS NOT CONTROLLED WITH YOUR NAUSEA MEDICATION *UNUSUAL SHORTNESS OF BREATH *UNUSUAL BRUISING OR BLEEDING *URINARY PROBLEMS (pain or burning when urinating, or frequent urination) *BOWEL PROBLEMS (unusual diarrhea, constipation, pain near the anus) TENDERNESS IN MOUTH AND THROAT WITH OR WITHOUT PRESENCE OF ULCERS (sore throat, sores in mouth, or a toothache) UNUSUAL RASH, SWELLING OR PAIN  UNUSUAL VAGINAL DISCHARGE OR ITCHING   Items with * indicate a potential emergency and should be followed up as soon as possible or go to the Emergency Department if any problems should occur.  Please show the CHEMOTHERAPY ALERT CARD or IMMUNOTHERAPY ALERT CARD at check-in to the Emergency Department and triage nurse.  Should you have questions after your visit or need to cancel  or reschedule your appointment, please contact [facility] [phone]  and follow the prompts.  Office hours are 8:00 a.m. to 4:30 p.m. Monday - Friday. Please note that voicemails left after 4:00 p.m. may not be returned until the following business day.  We are closed weekends and major holidays. You have access to a nurse at all times for urgent questions. Please call the main number to the clinic [phone] and follow the prompts.  For any non-urgent questions, you may also contact your provider using MyChart. We now offer e-Visits for anyone 18 and older to request care online for non-urgent symptoms. For details visit [email or web address].   Also download the MyChart app! Go to the app store, search "MyChart", open the app, select , and log in with your MyChart username and password.  Due to Covid, a mask is required upon entering the hospital/clinic. If you do not have a mask, one will be given to you upon arrival. For doctor visits, patients may have 1 support person aged 18 or older with them. For treatment visits, patients cannot have anyone with them due to current Covid guidelines and our immunocompromised population.  

## 2021-05-10 NOTE — Progress Notes (Signed)
Patient tolerated treatment well with no complaints voiced.  Patient left ambulatory in stable condition.  Vital signs stable at discharge.  Follow up as scheduled.    

## 2021-05-15 ENCOUNTER — Encounter: Payer: Self-pay | Admitting: Internal Medicine

## 2021-05-22 ENCOUNTER — Inpatient Hospital Stay (HOSPITAL_COMMUNITY): Payer: 59

## 2021-05-22 ENCOUNTER — Other Ambulatory Visit: Payer: Self-pay

## 2021-05-22 ENCOUNTER — Inpatient Hospital Stay (HOSPITAL_COMMUNITY): Payer: 59 | Admitting: Hematology

## 2021-05-22 VITALS — BP 148/87 | HR 100 | Temp 98.7°F | Resp 18

## 2021-05-22 DIAGNOSIS — C169 Malignant neoplasm of stomach, unspecified: Secondary | ICD-10-CM

## 2021-05-22 DIAGNOSIS — Z95828 Presence of other vascular implants and grafts: Secondary | ICD-10-CM

## 2021-05-22 LAB — MAGNESIUM: Magnesium: 1.6 mg/dL — ABNORMAL LOW (ref 1.7–2.4)

## 2021-05-22 LAB — COMPREHENSIVE METABOLIC PANEL
ALT: 17 U/L (ref 0–44)
AST: 16 U/L (ref 15–41)
Albumin: 3.9 g/dL (ref 3.5–5.0)
Alkaline Phosphatase: 109 U/L (ref 38–126)
Anion gap: 11 (ref 5–15)
BUN: 26 mg/dL — ABNORMAL HIGH (ref 8–23)
CO2: 17 mmol/L — ABNORMAL LOW (ref 22–32)
Calcium: 9.5 mg/dL (ref 8.9–10.3)
Chloride: 112 mmol/L — ABNORMAL HIGH (ref 98–111)
Creatinine, Ser: 1.47 mg/dL — ABNORMAL HIGH (ref 0.44–1.00)
GFR, Estimated: 40 mL/min — ABNORMAL LOW (ref >60)
Glucose, Bld: 136 mg/dL — ABNORMAL HIGH (ref 70–99)
Potassium: 3.4 mmol/L — ABNORMAL LOW (ref 3.5–5.1)
Sodium: 140 mmol/L (ref 135–145)
Total Bilirubin: 0.7 mg/dL (ref 0.3–1.2)
Total Protein: 6.8 g/dL (ref 6.5–8.1)

## 2021-05-22 MED ORDER — SODIUM CHLORIDE 0.9 % IV SOLN: INTRAVENOUS | Status: DC

## 2021-05-22 MED ORDER — FLUOROURACIL CHEMO INJECTION 2.5 GM/50ML
320.0000 mg/m2 | Freq: Once | INTRAVENOUS | Status: AC
  Administered 2021-05-22: 600 mg via INTRAVENOUS
  Filled 2021-05-22: qty 12

## 2021-05-22 MED ORDER — MAGNESIUM SULFATE 2 GM/50ML IV SOLN
2.0000 g | Freq: Once | INTRAVENOUS | Status: AC
  Administered 2021-05-22: 2 g via INTRAVENOUS
  Filled 2021-05-22: qty 50

## 2021-05-22 MED ORDER — PALONOSETRON HCL INJECTION 0.25 MG/5ML
0.2500 mg | Freq: Once | INTRAVENOUS | Status: AC
  Administered 2021-05-22: 0.25 mg via INTRAVENOUS
  Filled 2021-05-22: qty 5

## 2021-05-22 MED ORDER — SODIUM CHLORIDE 0.9 % IV SOLN
10.0000 mg | Freq: Once | INTRAVENOUS | Status: AC
  Administered 2021-05-22: 10 mg via INTRAVENOUS
  Filled 2021-05-22: qty 10

## 2021-05-22 MED ORDER — SODIUM CHLORIDE 0.9 % IV SOLN
1920.0000 mg/m2 | INTRAVENOUS | Status: DC
  Administered 2021-05-22: 3650 mg via INTRAVENOUS
  Filled 2021-05-22: qty 73

## 2021-05-22 MED ORDER — SODIUM CHLORIDE 0.9% FLUSH
10.0000 mL | INTRAVENOUS | Status: DC | PRN
  Administered 2021-05-22: 10 mL

## 2021-05-22 MED ORDER — OXALIPLATIN CHEMO INJECTION 100 MG/20ML
68.0000 mg/m2 | Freq: Once | INTRAVENOUS | Status: AC
  Administered 2021-05-22: 130 mg via INTRAVENOUS
  Filled 2021-05-22: qty 10

## 2021-05-22 MED ORDER — LEUCOVORIN CALCIUM INJECTION 350 MG
320.0000 mg/m2 | Freq: Once | INTRAVENOUS | Status: AC
  Administered 2021-05-22: 608 mg via INTRAVENOUS
  Filled 2021-05-22: qty 30.4

## 2021-05-22 MED ORDER — DEXTROSE 5 % IV SOLN: Freq: Once | INTRAVENOUS | Status: AC

## 2021-05-22 NOTE — Progress Notes (Signed)
Patient okay for treatment Patient tolerated chemotherapy with no complaints voiced. Side effects with management reviewed understanding verbalized. Port site clean and dry with no bruising or swelling noted at site. Good blood return noted before and after administration of chemotherapy. Band aid applied. Patient left in satisfactory condition with VSS and no s/s of distress noted.  ?

## 2021-05-22 NOTE — Progress Notes (Signed)
? ?[facility] ?[address] ?[place],  [address] ? ? ?CLINIC:  ?Medical Oncology/Hematology ? ?PCP:  ?Celene Squibb, MD ?[address] [place] [address] ?[phone] ? ? 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 ?PRIOR THERAPY: none ? ?NGS Results: not done ? ?CURRENT THERAPY: FOLFOX ? ?BRIEF ONCOLOGIC HISTORY:  ?Oncology History  ?Gastric cancer ([place])  ?02/08/2021 Initial Diagnosis  ? Gastric cancer ([place]) ?  ?03/14/2021 -  Chemotherapy  ? Patient is on Treatment Plan : GASTROESOPHAGEAL FOLFOX q14d x 6 cycles  ?   ? ? ?CANCER STAGING: ? Cancer Staging  ?Gastric cancer ([facility] [place]) ?Staging form: Stomach, AJCC 8th Edition ?- Clinical stage from 02/08/2021: Stage IVB (cTX, cN2, cM1) - Unsigned ? ? ?INTERVAL HISTORY:  ?[name], a 66 y.o. female, returns for routine follow-up and consideration for next cycle of chemotherapy. Hannah Knight was last seen on 05/08/2021. ? ?Due for cycle #5 of FOLFOX today.  ? ?Overall, she tells me she has been feeling pretty well. She has lost 10 lbs since 04/26/21. She drinks 1 Ensure clear and [place]; she reports diarrhea following drinking Ensure clear and [employer]. She is eating soups and milkshakes, and she is unable to eat solid foods. She denies fatigue. She is not currently taking amlodipine or metformin over the past 2 weeks. Her nausea is well controlled with Compazine every 6 hours. She is taking Megace BID. She is taking magnesium BID. She denies tingling/numbness. She denies abdominal pain. She has cold sensitivity for 5-6 days following her treatments.  ? ?Overall, she feels ready for next cycle of chemo today.  ? ? ?REVIEW OF SYSTEMS:  ?Review of Systems  ?Constitutional:  Positive for appetite change and unexpected weight change (-10 lbs). Negative for fatigue.  ?HENT:   Positive for trouble swallowing.   ?Gastrointestinal:  Positive for diarrhea. Negative for nausea (controlled).   ?Neurological:  Negative for numbness.  ?All other systems reviewed and are negative. ? ?PAST MEDICAL/SURGICAL HISTORY:  ?Past Medical History:  ?Diagnosis Date  ? Allergy   ? Arthritis   ? Diabetes mellitus   ? Graves disease   ? with radiation, now on Synthroid  ? Graves disease   ? Hypertension   ? Port-A-Cath in place 03/08/2021  ? ?Past Surgical History:  ?Procedure Laterality Date  ? BIOPSY  01/23/2021  ? Procedure: BIOPSY;  Surgeon: [name], DO;  [facility];  Service: Endoscopy;;  ? CHOLECYSTECTOMY    ? COLONOSCOPY  09/22/2007  ? SLF: moderate internal hemorrhoids, multiple 3-4 mm sessile polyps in splenic flexure, hyperplastic.   ? COLONOSCOPY  09/27/2002  ? Rehman:Small external hemorrhoids/Six tiny   polyps ablated by cold biopsy from sigmoid colon  ? COLONOSCOPY N/A 06/29/2013  ? Moderate sized internal hemorrhoids. Sigmoid diverticulosis. Normal TI. Banding X 3.  ? COLONOSCOPY WITH PROPOFOL N/A 01/23/2021  ? Procedure: COLONOSCOPY WITH PROPOFOL;  Surgeon: [name], DO;  [facility];  Service: Endoscopy;  Laterality: N/A;  8:00am  ? ESOPHAGOGASTRODUODENOSCOPY (EGD) WITH PROPOFOL N/A 01/23/2021  ? Procedure: ESOPHAGOGASTRODUODENOSCOPY (EGD) WITH PROPOFOL;  Surgeon: [name], DO;  [facility];  Service: Endoscopy;  Laterality: N/A;  ? HEMORRHOID BANDING  06/29/2013  ? Procedure: HEMORRHOID BANDING;  Surgeon: Danie Binder, MD;  [facility];  Service: Endoscopy;;  ? IR IMAGING GUIDED PORT INSERTION  02/21/2021  ? IR  US GUIDE BX ASP/DRAIN  02/21/2021  ? TUBAL LIGATION    ? ? ?SOCIAL HISTORY:  ?Social History  ? ?Socioeconomic History  ? Marital status: Legally Separated  ?  Spouse name: Not on file  ? Number of children: Not on file  ? Years of education: Not on file  ? Highest education level: Not on file  ?Occupational History  ? 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 Smoking status: Never  ? Smokeless  tobacco: Never  ?Vaping Use  ? Vaping Use: Never used  ?Substance and Sexual Activity  ? Alcohol use: No  ? Drug use: No  ? Sexual activity: Not on file  ?Other Topics Concern  ? Not on file  ?Social History Narrative  ? Not on file  ? ?Social Determinants of Health  ? ?Financial Resource Strain: Low Risk   ? Difficulty of Paying Living Expenses: Not very hard  ?Food Insecurity: No Food Insecurity  ? Worried About Charity fundraiser in the Last Year: Never true  ? Ran Out of Food in the Last Year: Never true  ?Transportation Needs: No Transportation Needs  ? Lack of Transportation (Medical): No  ? Lack of Transportation (Non-Medical): No  ?Physical Activity: Not on file  ?Stress: No Stress Concern Present  ? Feeling of Stress : Only a little  ?Social Connections: Moderately Integrated  ? Frequency of Communication with Friends and Family: More than three times a week  ? Frequency of Social Gatherings with Friends and Family: More than three times a week  ? Attends Religious Services: More than 4 times per year  ? Active Member of Clubs or Organizations: Yes  ? Attends Archivist Meetings: More than 4 times per year  ? Marital Status: Separated  ?Intimate Partner Violence: Not on file  ? ? ?FAMILY HISTORY:  ?Family History  ?Problem Relation Age of Onset  ? Colon cancer Father 66  ?     deceased  ? Diabetes Mother   ? Heart disease Mother   ? Cancer - Lung Brother   ? ? ?CURRENT MEDICATIONS:  ?Current Outpatient Medications  ?Medication Sig Dispense Refill  ? acetaminophen (TYLENOL) 650 MG CR tablet Take 1,300 mg by mouth every 8 (eight) hours as needed for pain.    ? amLODipine (NORVASC) 5 MG tablet Take 5 mg by mouth every evening.    ? atorvastatin (LIPITOR) 10 MG tablet Take 1 tablet (10 mg total) by mouth daily. (Patient taking differently: Take 10 mg by mouth at bedtime.) 90 tablet 3  ? fluorouracil CALGB 74259 2,400 mg/m2 in sodium chloride 0.9 % 150 mL Inject 2,400 mg/m2 into the vein over 48 hr.     ? FLUOROURACIL IV Inject into the vein every 14 (fourteen) days.    ? LEUCOVORIN CALCIUM IV Inject into the vein every 14 (fourteen) days.    ? levothyroxine (SYNTHROID) 112 MCG tablet TAKE 1 TABLET BY MOUTH EVERY MORNING BEFORE BREAKFAST 90 tablet 3  ? losartan (COZAAR) 100 MG tablet Take 1 tablet (100 mg total) by mouth daily. 90 tablet 3  ? magnesium oxide (MAG-OX) 400 (240 Mg) MG tablet Take 1 tablet (400 mg total) by mouth 2 (two) times daily. 60 tablet 3  ? megestrol (MEGACE) 400 MG/10ML suspension Take 10 mLs (400 mg total) by mouth 2 (two) times daily. 480 mL 2  ? metFORMIN (GLUCOPHAGE) 500 MG tablet Take 1 tablet (500 mg total) by mouth 2 (two) times  daily with a meal. 120 tablet 5  ? OXALIPLATIN IV Inject into the vein every 14 (fourteen) days.    ? potassium chloride SA (KLOR-CON M) 20 MEQ tablet Take 2 tablets (40 mEq total) by mouth daily. Take 2 tablets every 3 hours today 03/12/2021, prior to bedtime,  then 2 tablets daily thereafter. 63 tablet 1  ? prochlorperazine (COMPAZINE) 10 MG tablet TAKE 1 TABLET(10 MG) BY MOUTH EVERY 6 HOURS AS NEEDED FOR NAUSEA OR VOMITING 60 tablet 4  ? scopolamine (TRANSDERM-SCOP) 1 MG/3DAYS Place 1 patch (1.5 mg total) onto the skin every 3 (three) days. 10 patch 12  ? ?No current facility-administered medications for this visit.  ? ? ?ALLERGIES:  ?Allergies  ?Allergen Reactions  ? Ace Inhibitors Anaphylaxis and Swelling  ? ? ?PHYSICAL EXAM:  ?Performance status (ECOG): 0 - Asymptomatic ? ?There were no vitals filed for this visit. ?Wt Readings from Last 3 Encounters:  ?05/22/21 153 lb 8 oz (69.6 kg)  ?04/26/21 163 lb 9.3 oz (74.2 kg)  ?04/24/21 160 lb 12.8 oz (72.9 kg)  ? ?Physical Exam ?Vitals reviewed.  ?Constitutional:   ?   Appearance: Normal appearance.  ?Cardiovascular:  ?   Rate and Rhythm: Normal rate and regular rhythm.  ?   Pulses: Normal pulses.  ?   Heart sounds: Normal heart sounds.  ?Pulmonary:  ?   Effort: Pulmonary effort is normal.  ?   Breath sounds:  Normal breath sounds.  ?Abdominal:  ?   Palpations: Abdomen is soft. There is no mass.  ?   Tenderness: There is no abdominal tenderness.  ?Musculoskeletal:  ?   Right lower leg: No edema.  ?   Left lower l

## 2021-05-22 NOTE — Patient Instructions (Signed)
[place] at [facility] ?Discharge Instructions ? ? ?You were seen and examined today by Dr. Delton Coombes. ? ?He reviewed your lab work which is mostly normal/stable. Your kidney function is slightly elevated.  We will give you extra IV fluid today to help with this.  Your magnesium is also slightly low.  We will give you IV magnesium.  You should increase your magnesium at home to three times a day. ? ?We will refer you to our dietician for your weight loss. ? ?We will proceed with your treatment today. ? ?Return as scheduled in 2 weeks.  ? ? ?Thank you for choosing [facility] at [facility] to provide your oncology and hematology care.  To afford each patient quality time with our provider, please arrive at least 15 minutes before your scheduled appointment time.  ? ?If you have a lab appointment with the [facility] please come in thru the Main Entrance and check in at the main information desk. ? ?You need to re-schedule your appointment should you arrive 10 or more minutes late.  We strive to give you quality time with our providers, and arriving late affects you and other patients whose appointments are after yours.  Also, if you no show three or more times for appointments you may be dismissed from the clinic at the providers discretion.     ?Again, thank you for choosing [facility].  Our hope is that these requests will decrease the amount of time that you wait before being seen by our physicians.       ?_____________________________________________________________ ? ?Should you have questions after your visit to [facility], please contact our office at [phone] and follow the prompts.  Our office hours are 8:00 a.m. and 4:30 p.m. Monday - Friday.  Please note that voicemails left after 4:00 p.m. may not be returned until the following business day.  We are closed weekends and major holidays.  You do have access to a nurse  24-7, just call the main number to the clinic [phone] and do not press any options, hold on the line and a nurse will answer the phone.   ? ?For prescription refill requests, have your pharmacy contact our office and allow 72 hours.   ? ?Due to Covid, you will need to wear a mask upon entering the hospital. If you do not have a mask, a mask will be given to you at the Main Entrance upon arrival. For doctor visits, patients may have 1 support person age 76 or older with them. For treatment visits, patients can not have anyone with them due to social distancing guidelines and our immunocompromised population.  ? ?   ?

## 2021-05-22 NOTE — Progress Notes (Signed)
Patient has been examined by Dr. Katragadda, and vital signs and labs have been reviewed. ANC, Creatinine, LFTs, hemoglobin, and platelets are within treatment parameters per M.D. - pt may proceed with treatment.    °

## 2021-05-22 NOTE — Progress Notes (Signed)
Patients port flushed without difficulty.  Good blood return noted with no bruising or swelling noted at site.  Stable during access and blood draw.  Patient to remain accessed for treatment. 

## 2021-05-22 NOTE — Progress Notes (Signed)
Chaplain engaged in an initial visit with Hannah Knight and her sister.  Chaplain explained her role and introduced herself.  Hannah Knight and her sister were able to share about their family and their different personalities.  Hannah Knight shared her love of silence and being aware of those around her.  She enjoys time to analyze what she sees and who she meets.  Hannah Knight is also from a big family, being one of eleven children.  She voiced that her parents raised all of them to be very close.  They have always looked out for each other and supported each other.  Chaplain affirmed the ways Hannah Knight's sister was by her side today.   ? ?Hannah Knight and her sister shared of their loving upbringing and childhood.  Chaplain spent time building community with them, getting to them, offering reflective listening, and a compassionate presence. ? ? ? 05/22/21 1100  ?Clinical Encounter Type  ?Visited With Patient and family together  ?Visit Type Initial;Spiritual support  ? ? ?

## 2021-05-22 NOTE — Patient Instructions (Signed)
[facility]  Discharge Instructions: ?Thank you for choosing [facility] to provide your oncology and hematology care.  ?If you have a lab appointment with the [facility], please come in thru the Main Entrance and check in at the main information desk. ? ?Wear comfortable clothing and clothing appropriate for easy access to any Portacath or PICC line.  ? ?We strive to give you quality time with your provider. You may need to reschedule your appointment if you arrive late (15 or more minutes).  Arriving late affects you and other patients whose appointments are after yours.  Also, if you miss three or more appointments without notifying the office, you may be dismissed from the clinic at the provider?s discretion.    ?  ?For prescription refill requests, have your pharmacy contact our office and allow 72 hours for refills to be completed.   ? ?Today you received the following chemotherapy, FOLFOX, return as scheduled. ?  ?To help prevent nausea and vomiting after your treatment, we encourage you to take your nausea medication as directed. ? ?BELOW ARE SYMPTOMS THAT SHOULD BE REPORTED IMMEDIATELY: ?*FEVER GREATER THAN 100.4 F (38 ?C) OR HIGHER ?*CHILLS OR SWEATING ?*NAUSEA AND VOMITING THAT IS NOT CONTROLLED WITH YOUR NAUSEA MEDICATION ?*UNUSUAL SHORTNESS OF BREATH ?*UNUSUAL BRUISING OR BLEEDING ?*URINARY PROBLEMS (pain or burning when urinating, or frequent urination) ?*BOWEL PROBLEMS (unusual diarrhea, constipation, pain near the anus) ?TENDERNESS IN MOUTH AND THROAT WITH OR WITHOUT PRESENCE OF ULCERS (sore throat, sores in mouth, or a toothache) ?UNUSUAL RASH, SWELLING OR PAIN  ?UNUSUAL VAGINAL DISCHARGE OR ITCHING  ? ?Items with * indicate a potential emergency and should be followed up as soon as possible or go to the Emergency Department if any problems should occur. ? ?Please show the CHEMOTHERAPY ALERT CARD or IMMUNOTHERAPY ALERT CARD at check-in to the Emergency Department and  triage nurse. ? ?Should you have questions after your visit or need to cancel or reschedule your appointment, please contact [facility] [phone]  and follow the prompts.  Office hours are 8:00 a.m. to 4:30 p.m. Monday - Friday. Please note that voicemails left after 4:00 p.m. may not be returned until the following business day.  We are closed weekends and major holidays. You have access to a nurse at all times for urgent questions. Please call the main number to the clinic [phone] and follow the prompts. ? ?For any non-urgent questions, you may also contact your provider using MyChart. We now offer e-Visits for anyone 4 and older to request care online for non-urgent symptoms. For details visit [email or web address]. ?  ?Also download the MyChart app! Go to the app store, search "MyChart", open the app, select [facility], and log in with your MyChart username and password. ? ?Due to Covid, a mask is required upon entering the hospital/clinic. If you do not have a mask, one will be given to you upon arrival. For doctor visits, patients may have 1 support person aged 64 or older with them. For treatment visits, patients cannot have anyone with them due to current Covid guidelines and our immunocompromised population.  ?

## 2021-05-23 LAB — CBC WITH DIFFERENTIAL/PLATELET
Abs Immature Granulocytes: 0.18 10*3/uL — ABNORMAL HIGH (ref 0.00–0.07)
Basophils Absolute: 0.1 10*3/uL (ref 0.0–0.1)
Basophils Relative: 1 %
Eosinophils Absolute: 0.1 10*3/uL (ref 0.0–0.5)
Eosinophils Relative: 1 %
HCT: 30.4 % — ABNORMAL LOW (ref 36.0–46.0)
Hemoglobin: 9.6 g/dL — ABNORMAL LOW (ref 12.0–15.0)
Immature Granulocytes: 1 %
Lymphocytes Relative: 24 %
Lymphs Abs: 3.3 10*3/uL (ref 0.7–4.0)
MCH: 28.6 pg (ref 26.0–34.0)
MCHC: 31.6 g/dL (ref 30.0–36.0)
MCV: 90.5 fL (ref 80.0–100.0)
Monocytes Absolute: 1.1 10*3/uL — ABNORMAL HIGH (ref 0.1–1.0)
Monocytes Relative: 8 %
Neutro Abs: 9.1 10*3/uL — ABNORMAL HIGH (ref 1.7–7.7)
Neutrophils Relative %: 65 %
Platelets: 187 10*3/uL (ref 150–400)
RBC: 3.36 MIL/uL — ABNORMAL LOW (ref 3.87–5.11)
RDW: 21.8 % — ABNORMAL HIGH (ref 11.5–15.5)
WBC: 13.9 10*3/uL — ABNORMAL HIGH (ref 4.0–10.5)
nRBC: 0.4 % — ABNORMAL HIGH (ref 0.0–0.2)

## 2021-05-23 LAB — CEA: CEA: 5 ng/mL — ABNORMAL HIGH (ref 0.0–4.7)

## 2021-05-24 ENCOUNTER — Inpatient Hospital Stay (HOSPITAL_COMMUNITY): Payer: 59

## 2021-05-24 ENCOUNTER — Other Ambulatory Visit: Payer: Self-pay

## 2021-05-24 VITALS — BP 117/74 | HR 109 | Temp 97.7°F | Resp 16

## 2021-05-24 DIAGNOSIS — Z95828 Presence of other vascular implants and grafts: Secondary | ICD-10-CM

## 2021-05-24 DIAGNOSIS — C169 Malignant neoplasm of stomach, unspecified: Secondary | ICD-10-CM

## 2021-05-24 MED ORDER — HEPARIN SOD (PORK) LOCK FLUSH 100 UNIT/ML IV SOLN
500.0000 [IU] | Freq: Once | INTRAVENOUS | Status: AC | PRN
  Administered 2021-05-24: 500 [IU]

## 2021-05-24 MED ORDER — PEGFILGRASTIM-BMEZ 6 MG/0.6ML ~~LOC~~ SOSY
6.0000 mg | PREFILLED_SYRINGE | Freq: Once | SUBCUTANEOUS | Status: AC
  Administered 2021-05-24: 6 mg via SUBCUTANEOUS
  Filled 2021-05-24: qty 0.6

## 2021-05-24 MED ORDER — SODIUM CHLORIDE 0.9% FLUSH
10.0000 mL | INTRAVENOUS | Status: DC | PRN
  Administered 2021-05-24: 10 mL

## 2021-05-24 NOTE — Patient Instructions (Signed)
[facility]  Discharge Instructions: ?Thank you for choosing [facility] to provide your oncology and hematology care.  ?If you have a lab appointment with the [facility], please come in thru the Main Entrance and check in at the main information desk. ? ?Wear comfortable clothing and clothing appropriate for easy access to any Portacath or PICC line.  ? ?We strive to give you quality time with your provider. You may need to reschedule your appointment if you arrive late (15 or more minutes).  Arriving late affects you and other patients whose appointments are after yours.  Also, if you miss three or more appointments without notifying the office, you may be dismissed from the clinic at the provider?s discretion.    ?  ?For prescription refill requests, have your pharmacy contact our office and allow 72 hours for refills to be completed.   ? ?Today you received the following chemotherapy and/or immunotherapy agents Pump stop and port deaccess with ziextenzo    ?  ?To help prevent nausea and vomiting after your treatment, we encourage you to take your nausea medication as directed. ? ?BELOW ARE SYMPTOMS THAT SHOULD BE REPORTED IMMEDIATELY: ?*FEVER GREATER THAN 100.4 F (38 ?C) OR HIGHER ?*CHILLS OR SWEATING ?*NAUSEA AND VOMITING THAT IS NOT CONTROLLED WITH YOUR NAUSEA MEDICATION ?*UNUSUAL SHORTNESS OF BREATH ?*UNUSUAL BRUISING OR BLEEDING ?*URINARY PROBLEMS (pain or burning when urinating, or frequent urination) ?*BOWEL PROBLEMS (unusual diarrhea, constipation, pain near the anus) ?TENDERNESS IN MOUTH AND THROAT WITH OR WITHOUT PRESENCE OF ULCERS (sore throat, sores in mouth, or a toothache) ?UNUSUAL RASH, SWELLING OR PAIN  ?UNUSUAL VAGINAL DISCHARGE OR ITCHING  ? ?Items with * indicate a potential emergency and should be followed up as soon as possible or go to the Emergency Department if any problems should occur. ? ?Please show the CHEMOTHERAPY ALERT CARD or IMMUNOTHERAPY ALERT CARD at  check-in to the Emergency Department and triage nurse. ? ?Should you have questions after your visit or need to cancel or reschedule your appointment, please contact [facility] [phone]  and follow the prompts.  Office hours are 8:00 a.m. to 4:30 p.m. Monday - Friday. Please note that voicemails left after 4:00 p.m. may not be returned until the following business day.  We are closed weekends and major holidays. You have access to a nurse at all times for urgent questions. Please call the main number to the clinic [phone] and follow the prompts. ? ?For any non-urgent questions, you may also contact your provider using MyChart. We now offer e-Visits for anyone 18 and older to request care online for non-urgent symptoms. For details visit [email or web address]. ?  ?Also download the MyChart app! Go to the app store, search "MyChart", open the app, select [place] [facility], and log in with your MyChart username and password. ? ?Due to Covid, a mask is required upon entering the hospital/clinic. If you do not have a mask, one will be given to you upon arrival. For doctor visits, patients may have 1 support person aged 33 or older with them. For treatment visits, patients cannot have anyone with them due to current Covid guidelines and our immunocompromised population.  ?

## 2021-05-24 NOTE — Progress Notes (Signed)
Patient presents today for 5FU pump stop and disconnection after 46 hour continous infusion.   5FU pump deaccessed.  Patients port flushed without difficulty.  Good blood return noted with no bruising or swelling noted at site.  needle removed intact.  Band aid applied.  VSS with discharge and left in satisfactory condition ambulatory with no s/s of distress noted.    ? ?Ziextenzo administration without incident; injection site WNL; see MAR for injection details.  Patient tolerated procedure well and without incident.  No questions or complaints noted at this time.  ?

## 2021-05-28 ENCOUNTER — Other Ambulatory Visit: Payer: Self-pay

## 2021-05-28 ENCOUNTER — Inpatient Hospital Stay (HOSPITAL_COMMUNITY): Payer: 59 | Admitting: Dietician

## 2021-05-28 NOTE — Progress Notes (Signed)
Nutrition Follow-up: ? ?Patient receiving FOLFOX for stage IV gastric cancer.  ? ?Met with patient and sister in clinic. She reports feeling hungry since starting appetite stimulant (megace), but unable to tolerate regular textures due to persistent thick saliva. Patient is using baking soda salt water rinses twice daily. Patient reports diminished taste. She is eating blended fruits, pudding cups, soups. She has not tried eating finely chopped/puree foods. Patient is drinking milkshakes (when cold sensitivity subsides), one Ensure Clear and 2-3 Boost/Ensure supplements. Patient reports these "go right through her, but drinks them anyway." Patient has tried Dillard [place] which also gives her diarrhea. She reports not drinking much water. Patient does not like room temperature water.  ? ? ?Medications: Mag-ox, Klor-con, Megace ? ?Labs: 3/21 - K 3.4, Glucose 136, BUN 26, Cr 1.47, Mg 1.6 ? ?Anthropometrics: Last weight 153 lb 8 oz on 3/21 decreased 6% (10 lbs) in the last month; significant ? ?2/23 - 163 lb 9.3 oz  ?1/30 - 172 lb 12.8 oz  ?1/05 - 170 lb 6.4 oz ? ? ?NUTRITION DIAGNOSIS: Severe malnutrition continues  ? ? ?INTERVENTION:  ?Discussed ways to alter textures of foods while maximizing calories and protein (using milk/chicken broth in soups instead of water, adding canned chicken/tuna to soups and blending for smooth textures, adding Ensure/Boost to make smoothies, milkshakes, hot chocolate) - handouts with recipes provided ?Suggested drinking half of supplement at a time or cutting with milk for improved tolerance ?Discussed importance of hydration, suggested pt try 8 oz bottles of water vs 16 oz ?Discussed strategies for altered taste - handout with tips provided ?Continue baking soda salt water rinses ?Discussed strategies for thick saliva, suggested trying ginger ale rinses - handout with tips provided  ?Continue drinking Ensure Plus/equivalent, recommend 5 daily with poor intake ?One complimentary case of  Ensure Plus High Protein provided today  ?  ? ?MONITORING, EVALUATION, GOAL: weight trends, intake ? ?NEXT VISIT: via telephone Monday April 17 ? ? ? ?

## 2021-06-05 ENCOUNTER — Inpatient Hospital Stay (HOSPITAL_COMMUNITY): Payer: 59

## 2021-06-05 ENCOUNTER — Inpatient Hospital Stay (HOSPITAL_COMMUNITY): Payer: 59 | Admitting: Hematology

## 2021-06-05 ENCOUNTER — Inpatient Hospital Stay (HOSPITAL_COMMUNITY): Payer: 59 | Attending: Hematology

## 2021-06-05 VITALS — BP 125/84 | HR 76 | Temp 96.2°F | Resp 18

## 2021-06-05 VITALS — BP 123/82 | HR 114 | Temp 96.4°F | Resp 18 | Ht 65.0 in | Wt 146.8 lb

## 2021-06-05 DIAGNOSIS — R1310 Dysphagia, unspecified: Secondary | ICD-10-CM | POA: Diagnosis not present

## 2021-06-05 DIAGNOSIS — C169 Malignant neoplasm of stomach, unspecified: Secondary | ICD-10-CM | POA: Diagnosis present

## 2021-06-05 DIAGNOSIS — Z5111 Encounter for antineoplastic chemotherapy: Secondary | ICD-10-CM | POA: Diagnosis present

## 2021-06-05 DIAGNOSIS — Z79899 Other long term (current) drug therapy: Secondary | ICD-10-CM | POA: Diagnosis not present

## 2021-06-05 DIAGNOSIS — C787 Secondary malignant neoplasm of liver and intrahepatic bile duct: Secondary | ICD-10-CM | POA: Diagnosis not present

## 2021-06-05 DIAGNOSIS — R110 Nausea: Secondary | ICD-10-CM | POA: Insufficient documentation

## 2021-06-05 DIAGNOSIS — Z95828 Presence of other vascular implants and grafts: Secondary | ICD-10-CM

## 2021-06-05 DIAGNOSIS — R634 Abnormal weight loss: Secondary | ICD-10-CM | POA: Insufficient documentation

## 2021-06-05 DIAGNOSIS — D649 Anemia, unspecified: Secondary | ICD-10-CM | POA: Diagnosis not present

## 2021-06-05 LAB — CBC WITH DIFFERENTIAL/PLATELET
Abs Immature Granulocytes: 0.15 10*3/uL — ABNORMAL HIGH (ref 0.00–0.07)
Basophils Absolute: 0.1 10*3/uL (ref 0.0–0.1)
Basophils Relative: 0 %
Eosinophils Absolute: 0.1 10*3/uL (ref 0.0–0.5)
Eosinophils Relative: 1 %
HCT: 31.5 % — ABNORMAL LOW (ref 36.0–46.0)
Hemoglobin: 10.2 g/dL — ABNORMAL LOW (ref 12.0–15.0)
Immature Granulocytes: 1 %
Lymphocytes Relative: 21 %
Lymphs Abs: 2.8 10*3/uL (ref 0.7–4.0)
MCH: 29.2 pg (ref 26.0–34.0)
MCHC: 32.4 g/dL (ref 30.0–36.0)
MCV: 90.3 fL (ref 80.0–100.0)
Monocytes Absolute: 1 10*3/uL (ref 0.1–1.0)
Monocytes Relative: 8 %
Neutro Abs: 9.3 10*3/uL — ABNORMAL HIGH (ref 1.7–7.7)
Neutrophils Relative %: 69 %
Platelets: 255 10*3/uL (ref 150–400)
RBC: 3.49 MIL/uL — ABNORMAL LOW (ref 3.87–5.11)
RDW: 20.1 % — ABNORMAL HIGH (ref 11.5–15.5)
WBC: 13.4 10*3/uL — ABNORMAL HIGH (ref 4.0–10.5)
nRBC: 0 % (ref 0.0–0.2)

## 2021-06-05 LAB — COMPREHENSIVE METABOLIC PANEL
ALT: 22 U/L (ref 0–44)
AST: 19 U/L (ref 15–41)
Albumin: 4 g/dL (ref 3.5–5.0)
Alkaline Phosphatase: 115 U/L (ref 38–126)
Anion gap: 11 (ref 5–15)
BUN: 22 mg/dL (ref 8–23)
CO2: 16 mmol/L — ABNORMAL LOW (ref 22–32)
Calcium: 9.8 mg/dL (ref 8.9–10.3)
Chloride: 112 mmol/L — ABNORMAL HIGH (ref 98–111)
Creatinine, Ser: 1.28 mg/dL — ABNORMAL HIGH (ref 0.44–1.00)
GFR, Estimated: 47 mL/min — ABNORMAL LOW (ref >60)
Glucose, Bld: 131 mg/dL — ABNORMAL HIGH (ref 70–99)
Potassium: 3.1 mmol/L — ABNORMAL LOW (ref 3.5–5.1)
Sodium: 139 mmol/L (ref 135–145)
Total Bilirubin: 0.8 mg/dL (ref 0.3–1.2)
Total Protein: 7.1 g/dL (ref 6.5–8.1)

## 2021-06-05 LAB — MAGNESIUM: Magnesium: 1.9 mg/dL (ref 1.7–2.4)

## 2021-06-05 MED ORDER — OXALIPLATIN CHEMO INJECTION 100 MG/20ML
68.0000 mg/m2 | Freq: Once | INTRAVENOUS | Status: AC
  Administered 2021-06-05: 130 mg via INTRAVENOUS
  Filled 2021-06-05: qty 10

## 2021-06-05 MED ORDER — SODIUM CHLORIDE 0.9% FLUSH: 3.0000 mL | INTRAVENOUS | Status: DC | PRN

## 2021-06-05 MED ORDER — SODIUM CHLORIDE 0.9 % IV SOLN
10.0000 mg | Freq: Once | INTRAVENOUS | Status: AC
  Administered 2021-06-05: 10 mg via INTRAVENOUS
  Filled 2021-06-05: qty 10

## 2021-06-05 MED ORDER — SODIUM CHLORIDE 0.9% FLUSH: 10.0000 mL | INTRAVENOUS | Status: DC | PRN

## 2021-06-05 MED ORDER — SODIUM CHLORIDE 0.9 % IV SOLN
1920.0000 mg/m2 | INTRAVENOUS | Status: DC
  Administered 2021-06-05: 3650 mg via INTRAVENOUS
  Filled 2021-06-05: qty 73

## 2021-06-05 MED ORDER — HEPARIN SOD (PORK) LOCK FLUSH 100 UNIT/ML IV SOLN: 500.0000 [IU] | Freq: Once | INTRAVENOUS | Status: DC | PRN

## 2021-06-05 MED ORDER — DEXTROSE 5 % IV SOLN: Freq: Once | INTRAVENOUS | Status: AC

## 2021-06-05 MED ORDER — PALONOSETRON HCL INJECTION 0.25 MG/5ML
0.2500 mg | Freq: Once | INTRAVENOUS | Status: AC
  Administered 2021-06-05: 0.25 mg via INTRAVENOUS
  Filled 2021-06-05: qty 5

## 2021-06-05 MED ORDER — FLUOROURACIL CHEMO INJECTION 2.5 GM/50ML
320.0000 mg/m2 | Freq: Once | INTRAVENOUS | Status: AC
  Administered 2021-06-05: 600 mg via INTRAVENOUS
  Filled 2021-06-05: qty 12

## 2021-06-05 MED ORDER — LEUCOVORIN CALCIUM INJECTION 350 MG
320.0000 mg/m2 | Freq: Once | INTRAVENOUS | Status: AC
  Administered 2021-06-05: 608 mg via INTRAVENOUS
  Filled 2021-06-05: qty 30.4

## 2021-06-05 NOTE — Patient Instructions (Signed)
[facility]  Discharge Instructions: ?Thank you for choosing [facility] to provide your oncology and hematology care.  ?If you have a lab appointment with the [facility], please come in thru the Main Entrance and check in at the main information desk. ? ?Wear comfortable clothing and clothing appropriate for easy access to any Portacath or PICC line.  ? ?We strive to give you quality time with your provider. You may need to reschedule your appointment if you arrive late (15 or more minutes).  Arriving late affects you and other patients whose appointments are after yours.  Also, if you miss three or more appointments without notifying the office, you may be dismissed from the clinic at the provider?s discretion.    ?  ?For prescription refill requests, have your pharmacy contact our office and allow 72 hours for refills to be completed.   ? ?Today you received the following chemotherapy and/or immunotherapy agents Folfox with pump start    ?  ?To help prevent nausea and vomiting after your treatment, we encourage you to take your nausea medication as directed. ? ?BELOW ARE SYMPTOMS THAT SHOULD BE REPORTED IMMEDIATELY: ?*FEVER GREATER THAN 100.4 F (38 ?C) OR HIGHER ?*CHILLS OR SWEATING ?*NAUSEA AND VOMITING THAT IS NOT CONTROLLED WITH YOUR NAUSEA MEDICATION ?*UNUSUAL SHORTNESS OF BREATH ?*UNUSUAL BRUISING OR BLEEDING ?*URINARY PROBLEMS (pain or burning when urinating, or frequent urination) ?*BOWEL PROBLEMS (unusual diarrhea, constipation, pain near the anus) ?TENDERNESS IN MOUTH AND THROAT WITH OR WITHOUT PRESENCE OF ULCERS (sore throat, sores in mouth, or a toothache) ?UNUSUAL RASH, SWELLING OR PAIN  ?UNUSUAL VAGINAL DISCHARGE OR ITCHING  ? ?Items with * indicate a potential emergency and should be followed up as soon as possible or go to the Emergency Department if any problems should occur. ? ?Please show the CHEMOTHERAPY ALERT CARD or IMMUNOTHERAPY ALERT CARD at check-in to the  Emergency Department and triage nurse. ? ?Should you have questions after your visit or need to cancel or reschedule your appointment, please contact [facility] [phone]  and follow the prompts.  Office hours are 8:00 a.m. to 4:30 p.m. Monday - Friday. Please note that voicemails left after 4:00 p.m. may not be returned until the following business day.  We are closed weekends and major holidays. You have access to a nurse at all times for urgent questions. Please call the main number to the clinic [phone] and follow the prompts. ? ?For any non-urgent questions, you may also contact your provider using MyChart. We now offer e-Visits for anyone 42 and older to request care online for non-urgent symptoms. For details visit [email or web address]. ?  ?Also download the MyChart app! Go to the app store, search "MyChart", open the app, select [place], and log in with your MyChart username and password. ? ?Due to Covid, a mask is required upon entering the hospital/clinic. If you do not have a mask, one will be given to you upon arrival. For doctor visits, patients may have 1 support person aged 23 or older with them. For treatment visits, patients cannot have anyone with them due to current Covid guidelines and our immunocompromised population.  ?

## 2021-06-05 NOTE — Progress Notes (Signed)
Patient presents today for Folfox with pump start per providers order.  Vital signs within parameters for treatment.  Labs pending.  Patient has no new complaints at this time. ? ?Labs within parameters for treatment. ? ?Message received from Anastasio Champion RN/Dr. Delton Coombes patient okay for treatment. ? ?Folfox with pump start given today per MD orders. Stable during infusion without adverse affects.  Pump connected and verified Run on the screen with patient.  Vital signs stable.  No complaints at this time.  Discharge from clinic ambulatory in stable condition.  Alert and oriented X 3.  Follow up with [facility] as scheduled.  ?

## 2021-06-05 NOTE — Progress Notes (Signed)
Patient has been examined by Dr. Katragadda, and vital signs and labs have been reviewed. ANC, Creatinine, LFTs, hemoglobin, and platelets are within treatment parameters per M.D. - pt may proceed with treatment.    °

## 2021-06-05 NOTE — Patient Instructions (Addendum)
[place] at [facility] ?Discharge Instructions ? ? ?You were seen and examined today by Dr. Delton Coombes. ? ?He reviewed your lab work which is normal/stable.  Your potassium was slightly low at 3.1. ? ?We will arrange for you to have a swallow study done with speech/language pathologist to see why you are having difficulty swallowing solid food.  ? ?We will proceed with your treatment today. ? ?We will obtain a scan after this treatment. ? ?Return as scheduled in 2 weeks.  ? ? ?Thank you for choosing [place] at [facility] to provide your oncology and hematology care.  To afford each patient quality time with our provider, please arrive at least 15 minutes before your scheduled appointment time.  ? ?If you have a lab appointment with the [facility] please come in thru the Main Entrance and check in at the main information desk. ? ?You need to re-schedule your appointment should you arrive 10 or more minutes late.  We strive to give you quality time with our providers, and arriving late affects you and other patients whose appointments are after yours.  Also, if you no show three or more times for appointments you may be dismissed from the clinic at the providers discretion.     ?Again, thank you for choosing [facility].  Our hope is that these requests will decrease the amount of time that you wait before being seen by our physicians.       ?_____________________________________________________________ ? ?Should you have questions after your visit to [facility], please contact our office at [phone] and follow the prompts.  Our office hours are 8:00 a.m. and 4:30 p.m. Monday - Friday.  Please note that voicemails left after 4:00 p.m. may not be returned until the following business day.  We are closed weekends and major holidays.  You do have access to a nurse 24-7, just call the main number to the clinic [phone] and do not  press any options, hold on the line and a nurse will answer the phone.   ? ?For prescription refill requests, have your pharmacy contact our office and allow 72 hours.   ? ?Due to Covid, you will need to wear a mask upon entering the hospital. If you do not have a mask, a mask will be given to you at the Main Entrance upon arrival. For doctor visits, patients may have 1 support person age 44 or older with them. For treatment visits, patients can not have anyone with them due to social distancing guidelines and our immunocompromised population.  ? ?   ?

## 2021-06-05 NOTE — Progress Notes (Signed)
? ?[facility] ?[address] ?[place], [place] [address] ? ? ?CLINIC:  ?Medical Oncology/Hematology ? ?PCP:  ?Celene Squibb, MD ?[address] [place] [address] ?[phone] ? ? 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 ?PRIOR THERAPY: none ? ?NGS Results: not done ? ?CURRENT THERAPY: FOLFOX ? ?BRIEF ONCOLOGIC HISTORY:  ?Oncology History  ?Gastric cancer ([place])  ?02/08/2021 Initial Diagnosis  ? Gastric cancer ([place]) ?  ?03/14/2021 -  Chemotherapy  ? Patient is on Treatment Plan : GASTROESOPHAGEAL FOLFOX q14d x 6 cycles  ?   ? ? ?CANCER STAGING: ? Cancer Staging  ?Gastric cancer ([place]) ?Staging form: Stomach, AJCC 8th Edition ?- Clinical stage from 02/08/2021: Stage IVB (cTX, cN2, cM1) - Unsigned ? ? ?INTERVAL HISTORY:  ?[name], a 66 y.o. female, returns for routine follow-up and consideration for next cycle of chemotherapy. Hannah Knight was last seen on 05/22/2021. ? ?Due for cycle #6 of FOLFOX today.  ? ?Overall, she tells me she has been feeling pretty well. She has lost 7 lbs since 03/21. She reports she is unable to swallow solid food. She is drinking 2 Ensure/Boost and 1 Ensure clear daily along with soups or milkshakes. She denies nausea and numbness/tingling. She reports her cold sensitivity continues to last 4-5 days after treatment. She is occasional unable to swallow her potassium pill. She reports fatigue, but she is still able to do her typical daily activities. She denies vomiting, cough, mouth sores, diarrhea, and abdominal pain.  ? ?Overall, she feels ready for next cycle of chemo today.  ? ?REVIEW OF SYSTEMS:  ?Review of Systems  ?Constitutional:  Positive for appetite change, fatigue and unexpected weight change (-7 lbs).  ?HENT:   Positive for trouble swallowing. Negative for mouth sores.   ?Respiratory:  Negative for cough.   ?Gastrointestinal:  Negative for abdominal pain, diarrhea, nausea and vomiting.   ?Neurological:  Negative for numbness.  ?Psychiatric/Behavioral:  Positive for depression and sleep disturbance. The patient is nervous/anxious.   ?All other systems reviewed and are negative. ? ?PAST MEDICAL/SURGICAL HISTORY:  ?Past Medical History:  ?Diagnosis Date  ? Allergy   ? Arthritis   ? Diabetes mellitus   ? Graves disease   ? with radiation, now on Synthroid  ? Graves disease   ? Hypertension   ? Port-A-Cath in place 03/08/2021  ? ?Past Surgical History:  ?Procedure Laterality Date  ? BIOPSY  01/23/2021  ? Procedure: BIOPSY;  Surgeon: [name], DO;  [facility];  Service: Endoscopy;;  ? CHOLECYSTECTOMY    ? COLONOSCOPY  09/22/2007  ? SLF: moderate internal hemorrhoids, multiple 3-4 mm sessile polyps in splenic flexure, hyperplastic.   ? COLONOSCOPY  09/27/2002  ? Rehman:Small external hemorrhoids/Six tiny   polyps ablated by cold biopsy from sigmoid colon  ? COLONOSCOPY N/A 06/29/2013  ? Moderate sized internal hemorrhoids. Sigmoid diverticulosis. Normal TI. Banding X 3.  ? COLONOSCOPY WITH PROPOFOL N/A 01/23/2021  ? Procedure: COLONOSCOPY WITH PROPOFOL;  Surgeon: [name], DO;  [facility];  Service: Endoscopy;  Laterality: N/A;  8:00am  ? ESOPHAGOGASTRODUODENOSCOPY (EGD) WITH PROPOFOL N/A 01/23/2021  ? Procedure: ESOPHAGOGASTRODUODENOSCOPY (EGD) WITH PROPOFOL;  Surgeon: [name], DO;  [facility];  Service: Endoscopy;  Laterality: N/A;  ? HEMORRHOID BANDING  06/29/2013  ? Procedure: HEMORRHOID BANDING;  Surgeon: Danie Binder, MD;  Location: [facility];  Service: Endoscopy;;  ?  IR IMAGING GUIDED PORT INSERTION  02/21/2021  ? IR US GUIDE BX ASP/DRAIN  02/21/2021  ? TUBAL LIGATION    ? ? ?SOCIAL HISTORY:  ?Social History  ? ?Socioeconomic History  ? Marital status: Legally Separated  ?  Spouse name: Not on file  ? Number of children: Not on file  ? Years of education: Not on file  ? Highest education level: Not on file  ?Occupational History   ? 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 Smoking status: Never  ? Smokeless tobacco: Never  ?Vaping Use  ? Vaping Use: Never used  ?Substance and Sexual Activity  ? Alcohol use: No  ? Drug use: No  ? Sexual activity: Not on file  ?Other Topics Concern  ? Not on file  ?Social History Narrative  ? Not on file  ? ?Social Determinants of Health  ? ?Financial Resource Strain: Low Risk   ? Difficulty of Paying Living Expenses: Not very hard  ?Food Insecurity: No Food Insecurity  ? Worried About Charity fundraiser in the Last Year: Never true  ? Ran Out of Food in the Last Year: Never true  ?Transportation Needs: No Transportation Needs  ? Lack of Transportation (Medical): No  ? Lack of Transportation (Non-Medical): No  ?Physical Activity: Not on file  ?Stress: No Stress Concern Present  ? Feeling of Stress : Only a little  ?Social Connections: Moderately Integrated  ? Frequency of Communication with Friends and Family: More than three times a week  ? Frequency of Social Gatherings with Friends and Family: More than three times a week  ? Attends Religious Services: More than 4 times per year  ? Active Member of Clubs or Organizations: Yes  ? Attends Archivist Meetings: More than 4 times per year  ? Marital Status: Separated  ?Intimate Partner Violence: Not on file  ? ? ?FAMILY HISTORY:  ?Family History  ?Problem Relation Age of Onset  ? Colon cancer Father 61  ?     deceased  ? Diabetes Mother   ? Heart disease Mother   ? Cancer - Lung Brother   ? ? ?CURRENT MEDICATIONS:  ?Current Outpatient Medications  ?Medication Sig Dispense Refill  ? acetaminophen (TYLENOL) 650 MG CR tablet Take 1,300 mg by mouth every 8 (eight) hours as needed for pain.    ? amLODipine (NORVASC) 5 MG tablet Take 5 mg by mouth every evening.    ? atorvastatin (LIPITOR) 10 MG tablet Take 1 tablet (10 mg total) by mouth daily. (Patient taking differently: Take 10 mg by mouth at bedtime.) 90 tablet 3  ? fluorouracil  CALGB 46568 2,400 mg/m2 in sodium chloride 0.9 % 150 mL Inject 2,400 mg/m2 into the vein over 48 hr.    ? FLUOROURACIL IV Inject into the vein every 14 (fourteen) days.    ? LEUCOVORIN CALCIUM IV Inject into the vein every 14 (fourteen) days.    ? levothyroxine (SYNTHROID) 112 MCG tablet TAKE 1 TABLET BY MOUTH EVERY MORNING BEFORE BREAKFAST 90 tablet 3  ? losartan (COZAAR) 100 MG tablet Take 1 tablet (100 mg total) by mouth daily. 90 tablet 3  ? magnesium oxide (MAG-OX) 400 (240 Mg) MG tablet Take 1 tablet (400 mg total) by mouth 2 (two) times daily. 60 tablet 3  ? megestrol (MEGACE) 400 MG/10ML suspension Take 10 mLs (400 mg total) by mouth 2 (two) times daily. 480 mL 2  ? metFORMIN (GLUCOPHAGE) 500 MG tablet Take  1 tablet (500 mg total) by mouth 2 (two) times daily with a meal. 120 tablet 5  ? OXALIPLATIN IV Inject into the vein every 14 (fourteen) days.    ? potassium chloride SA (KLOR-CON M) 20 MEQ tablet Take 2 tablets (40 mEq total) by mouth daily. Take 2 tablets every 3 hours today 03/12/2021, prior to bedtime,  then 2 tablets daily thereafter. 63 tablet 1  ? prochlorperazine (COMPAZINE) 10 MG tablet TAKE 1 TABLET(10 MG) BY MOUTH EVERY 6 HOURS AS NEEDED FOR NAUSEA OR VOMITING 60 tablet 4  ? scopolamine (TRANSDERM-SCOP) 1 MG/3DAYS Place 1 patch (1.5 mg total) onto the skin every 3 (three) days. 10 patch 12  ? ?No current facility-administered medications for this visit.  ? ? ?ALLERGIES:  ?Allergies  ?Allergen Reactions  ? Ace Inhibitors Anaphylaxis and Swelling  ? ? ?PHYSICAL EXAM:  ?Performance status (ECOG): 0 - Asymptomatic ? ?There were no vitals filed for this visit. ?Wt Readings from Last 3 Encounters:  ?05/22/21 153 lb 8 oz (69.6 kg)  ?04/26/21 163 lb 9.3 oz (74.2 kg)  ?04/24/21 160 lb 12.8 oz (72.9 kg)  ? ?Physical Exam ?Vitals reviewed.  ?Constitutional:   ?   Appearance: Normal appearance.  ?Cardiovascular:  ?   Rate and Rhythm: Normal rate and regular rhythm.  ?   Pulses: Normal pulses.  ?   Heart  sounds: Normal heart sounds.  ?Pulmonary:  ?   Effort: Pulmonary effort is normal.  ?   Breath sounds: Normal breath sounds.  ?Musculoskeletal:  ?   Right lower leg: No edema.  ?   Left lower leg: No edema.  ?Neur

## 2021-06-07 ENCOUNTER — Inpatient Hospital Stay (HOSPITAL_COMMUNITY): Payer: 59

## 2021-06-07 ENCOUNTER — Encounter (HOSPITAL_COMMUNITY): Payer: Self-pay

## 2021-06-07 VITALS — BP 131/83 | HR 100 | Temp 97.0°F | Resp 18

## 2021-06-07 DIAGNOSIS — Z95828 Presence of other vascular implants and grafts: Secondary | ICD-10-CM

## 2021-06-07 DIAGNOSIS — C169 Malignant neoplasm of stomach, unspecified: Secondary | ICD-10-CM

## 2021-06-07 MED ORDER — SODIUM CHLORIDE 0.9% FLUSH
10.0000 mL | INTRAVENOUS | Status: DC | PRN
  Administered 2021-06-07: 10 mL

## 2021-06-07 MED ORDER — PEGFILGRASTIM-BMEZ 6 MG/0.6ML ~~LOC~~ SOSY
6.0000 mg | PREFILLED_SYRINGE | Freq: Once | SUBCUTANEOUS | Status: AC
  Administered 2021-06-07: 6 mg via SUBCUTANEOUS
  Filled 2021-06-07: qty 0.6

## 2021-06-07 MED ORDER — HEPARIN SOD (PORK) LOCK FLUSH 100 UNIT/ML IV SOLN
500.0000 [IU] | Freq: Once | INTRAVENOUS | Status: AC | PRN
  Administered 2021-06-07: 500 [IU]

## 2021-06-07 NOTE — Progress Notes (Signed)
Chemo pump disconnected, Port flushed with good blood return noted. No bruising or swelling at site. Bandaid applied. ?Patient tolerated injection with no complaints voiced. Site clean and dry with no bruising or swelling noted at site. See MAR for details. Band aid applied.  Patient stable during and after injection. VSS with discharge and left in satisfactory condition with no s/s of distress noted.  ?

## 2021-06-07 NOTE — Patient Instructions (Signed)
[facility]  Discharge Instructions: ?Thank you for choosing [facility] to provide your oncology and hematology care.  ?If you have a lab appointment with the [facility], please come in thru the Main Entrance and check in at the main information desk. ? ?Wear comfortable clothing and clothing appropriate for easy access to any Portacath or PICC line.  ? ?We strive to give you quality time with your provider. You may need to reschedule your appointment if you arrive late (15 or more minutes).  Arriving late affects you and other patients whose appointments are after yours.  Also, if you miss three or more appointments without notifying the office, you may be dismissed from the clinic at the provider?s discretion.    ?  ?For prescription refill requests, have your pharmacy contact our office and allow 72 hours for refills to be completed.   ? ?Today your chemo pump was disconnected and ziextenzo injection was given, return as scheduled. ?  ?To help prevent nausea and vomiting after your treatment, we encourage you to take your nausea medication as directed. ? ?BELOW ARE SYMPTOMS THAT SHOULD BE REPORTED IMMEDIATELY: ?*FEVER GREATER THAN 100.4 F (38 ?C) OR HIGHER ?*CHILLS OR SWEATING ?*NAUSEA AND VOMITING THAT IS NOT CONTROLLED WITH YOUR NAUSEA MEDICATION ?*UNUSUAL SHORTNESS OF BREATH ?*UNUSUAL BRUISING OR BLEEDING ?*URINARY PROBLEMS (pain or burning when urinating, or frequent urination) ?*BOWEL PROBLEMS (unusual diarrhea, constipation, pain near the anus) ?TENDERNESS IN MOUTH AND THROAT WITH OR WITHOUT PRESENCE OF ULCERS (sore throat, sores in mouth, or a toothache) ?UNUSUAL RASH, SWELLING OR PAIN  ?UNUSUAL VAGINAL DISCHARGE OR ITCHING  ? ?Items with * indicate a potential emergency and should be followed up as soon as possible or go to the Emergency Department if any problems should occur. ? ?Please show the CHEMOTHERAPY ALERT CARD or IMMUNOTHERAPY ALERT CARD at check-in to the  Emergency Department and triage nurse. ? ?Should you have questions after your visit or need to cancel or reschedule your appointment, please contact [facility] [phone]  and follow the prompts.  Office hours are 8:00 a.m. to 4:30 p.m. Monday - Friday. Please note that voicemails left after 4:00 p.m. may not be returned until the following business day.  We are closed weekends and major holidays. You have access to a nurse at all times for urgent questions. Please call the main number to the clinic [phone] and follow the prompts. ? ?For any non-urgent questions, you may also contact your provider using MyChart. We now offer e-Visits for anyone 56 and older to request care online for non-urgent symptoms. For details visit [email or web address]. ?  ?Also download the MyChart app! Go to the app store, search "MyChart", open the app, select [place], and log in with your MyChart username and password. ? ?Due to Covid, a mask is required upon entering the hospital/clinic. If you do not have a mask, one will be given to you upon arrival. For doctor visits, patients may have 1 support person aged 47 or older with them. For treatment visits, patients cannot have anyone with them due to current Covid guidelines and our immunocompromised population.  ?

## 2021-06-12 ENCOUNTER — Ambulatory Visit (HOSPITAL_COMMUNITY)
Admission: RE | Admit: 2021-06-12 | Discharge: 2021-06-12 | Disposition: A | Discharge status: Home / Self Care | Payer: 59 | Source: Ambulatory Visit | Attending: Hematology | Admitting: Hematology

## 2021-06-12 ENCOUNTER — Encounter (HOSPITAL_COMMUNITY): Payer: Self-pay | Admitting: *Deleted

## 2021-06-12 ENCOUNTER — Other Ambulatory Visit (HOSPITAL_COMMUNITY): Payer: Self-pay | Admitting: Hematology

## 2021-06-12 DIAGNOSIS — C169 Malignant neoplasm of stomach, unspecified: Secondary | ICD-10-CM | POA: Diagnosis present

## 2021-06-12 MED ORDER — IOHEXOL 300 MG/ML  SOLN
75.0000 mL | Freq: Once | INTRAMUSCULAR | Status: AC | PRN
  Administered 2021-06-12: 75 mL via INTRAVENOUS

## 2021-06-12 MED ORDER — APIXABAN (ELIQUIS) VTE STARTER PACK (10MG AND 5MG)
ORAL_TABLET | ORAL | 0 refills | Status: DC
Start: 2021-06-12 — End: 2021-07-04

## 2021-06-13 ENCOUNTER — Encounter (HOSPITAL_COMMUNITY): Payer: Self-pay | Admitting: Hematology

## 2021-06-13 NOTE — Telephone Encounter (Signed)
Was able to reach patient this morning and advised of incidental findings of PE on recent scan.  Advised of results and that Eliquis has been sent to her pharmacy.  Dr. Delton Coombes to discuss in more detail and upcoming visit.  Verbalized understanding and will pick up prescription today. ?

## 2021-06-14 ENCOUNTER — Other Ambulatory Visit (HOSPITAL_COMMUNITY): Payer: Self-pay | Admitting: Hematology

## 2021-06-18 ENCOUNTER — Inpatient Hospital Stay (HOSPITAL_COMMUNITY): Payer: 59 | Admitting: Dietician

## 2021-06-18 ENCOUNTER — Telehealth (HOSPITAL_COMMUNITY): Payer: Self-pay | Admitting: Dietician

## 2021-06-18 NOTE — Telephone Encounter (Signed)
Patient did not answer for scheduled telephone visit. Left message with request for return call. Contact information provided. ?

## 2021-06-19 ENCOUNTER — Inpatient Hospital Stay (HOSPITAL_COMMUNITY): Payer: 59 | Admitting: Hematology

## 2021-06-19 ENCOUNTER — Inpatient Hospital Stay (HOSPITAL_COMMUNITY): Payer: 59

## 2021-06-19 ENCOUNTER — Other Ambulatory Visit (HOSPITAL_COMMUNITY): Payer: Self-pay | Admitting: Specialist

## 2021-06-19 VITALS — BP 131/81 | HR 86 | Temp 97.8°F | Resp 18

## 2021-06-19 DIAGNOSIS — Z95828 Presence of other vascular implants and grafts: Secondary | ICD-10-CM

## 2021-06-19 DIAGNOSIS — C169 Malignant neoplasm of stomach, unspecified: Secondary | ICD-10-CM

## 2021-06-19 DIAGNOSIS — R1312 Dysphagia, oropharyngeal phase: Secondary | ICD-10-CM

## 2021-06-19 DIAGNOSIS — E876 Hypokalemia: Secondary | ICD-10-CM

## 2021-06-19 DIAGNOSIS — R633 Feeding difficulties, unspecified: Secondary | ICD-10-CM

## 2021-06-19 LAB — CBC WITH DIFFERENTIAL/PLATELET
Abs Immature Granulocytes: 0.11 10*3/uL — ABNORMAL HIGH (ref 0.00–0.07)
Basophils Absolute: 0 10*3/uL (ref 0.0–0.1)
Basophils Relative: 0 %
Eosinophils Absolute: 0 10*3/uL (ref 0.0–0.5)
Eosinophils Relative: 0 %
HCT: 30.5 % — ABNORMAL LOW (ref 36.0–46.0)
Hemoglobin: 9.9 g/dL — ABNORMAL LOW (ref 12.0–15.0)
Immature Granulocytes: 1 %
Lymphocytes Relative: 20 %
Lymphs Abs: 2.5 10*3/uL (ref 0.7–4.0)
MCH: 29 pg (ref 26.0–34.0)
MCHC: 32.5 g/dL (ref 30.0–36.0)
MCV: 89.4 fL (ref 80.0–100.0)
Monocytes Absolute: 1 10*3/uL (ref 0.1–1.0)
Monocytes Relative: 8 %
Neutro Abs: 8.7 10*3/uL — ABNORMAL HIGH (ref 1.7–7.7)
Neutrophils Relative %: 71 %
Platelets: 332 10*3/uL (ref 150–400)
RBC: 3.41 MIL/uL — ABNORMAL LOW (ref 3.87–5.11)
RDW: 18.6 % — ABNORMAL HIGH (ref 11.5–15.5)
WBC: 12.4 10*3/uL — ABNORMAL HIGH (ref 4.0–10.5)
nRBC: 0 % (ref 0.0–0.2)

## 2021-06-19 LAB — MAGNESIUM: Magnesium: 1.6 mg/dL — ABNORMAL LOW (ref 1.7–2.4)

## 2021-06-19 LAB — COMPREHENSIVE METABOLIC PANEL
ALT: 12 U/L (ref 0–44)
AST: 15 U/L (ref 15–41)
Albumin: 3.5 g/dL (ref 3.5–5.0)
Alkaline Phosphatase: 105 U/L (ref 38–126)
Anion gap: 11 (ref 5–15)
BUN: 27 mg/dL — ABNORMAL HIGH (ref 8–23)
CO2: 18 mmol/L — ABNORMAL LOW (ref 22–32)
Calcium: 9.5 mg/dL (ref 8.9–10.3)
Chloride: 108 mmol/L (ref 98–111)
Creatinine, Ser: 1.18 mg/dL — ABNORMAL HIGH (ref 0.44–1.00)
GFR, Estimated: 52 mL/min — ABNORMAL LOW (ref >60)
Glucose, Bld: 143 mg/dL — ABNORMAL HIGH (ref 70–99)
Potassium: 2.8 mmol/L — ABNORMAL LOW (ref 3.5–5.1)
Sodium: 137 mmol/L (ref 135–145)
Total Bilirubin: 0.6 mg/dL (ref 0.3–1.2)
Total Protein: 7 g/dL (ref 6.5–8.1)

## 2021-06-19 MED ORDER — OXALIPLATIN CHEMO INJECTION 100 MG/20ML
115.0000 mg | Freq: Once | INTRAVENOUS | Status: AC
  Administered 2021-06-19: 115 mg via INTRAVENOUS
  Filled 2021-06-19: qty 10

## 2021-06-19 MED ORDER — MAGNESIUM OXIDE -MG SUPPLEMENT 400 (240 MG) MG PO TABS
400.0000 mg | ORAL_TABLET | Freq: Once | ORAL | Status: AC
  Administered 2021-06-19: 400 mg via ORAL
  Filled 2021-06-19: qty 1

## 2021-06-19 MED ORDER — FLUOROURACIL CHEMO INJECTION 500 MG/10ML
450.0000 mg | Freq: Once | INTRAVENOUS | Status: AC
  Administered 2021-06-19: 450 mg via INTRAVENOUS
  Filled 2021-06-19: qty 9

## 2021-06-19 MED ORDER — DEXTROSE 5 % IV SOLN: Freq: Once | INTRAVENOUS | Status: AC

## 2021-06-19 MED ORDER — PALONOSETRON HCL INJECTION 0.25 MG/5ML
0.2500 mg | Freq: Once | INTRAVENOUS | Status: AC
  Administered 2021-06-19: 0.25 mg via INTRAVENOUS
  Filled 2021-06-19: qty 5

## 2021-06-19 MED ORDER — SODIUM CHLORIDE 0.9% FLUSH
10.0000 mL | INTRAVENOUS | Status: DC | PRN
  Administered 2021-06-19: 10 mL

## 2021-06-19 MED ORDER — SODIUM CHLORIDE 0.9 % IV SOLN
2750.0000 mg | INTRAVENOUS | Status: DC
  Administered 2021-06-19: 2750 mg via INTRAVENOUS
  Filled 2021-06-19: qty 55

## 2021-06-19 MED ORDER — SODIUM CHLORIDE 0.9 % IV SOLN
10.0000 mg | Freq: Once | INTRAVENOUS | Status: AC
  Administered 2021-06-19: 10 mg via INTRAVENOUS
  Filled 2021-06-19: qty 1

## 2021-06-19 MED ORDER — POTASSIUM CHLORIDE CRYS ER 20 MEQ PO TBCR
40.0000 meq | EXTENDED_RELEASE_TABLET | ORAL | Status: AC
  Administered 2021-06-19 (×2): 40 meq via ORAL
  Filled 2021-06-19 (×2): qty 2

## 2021-06-19 MED ORDER — LEUCOVORIN CALCIUM INJECTION 350 MG
458.0000 mg | Freq: Once | INTRAVENOUS | Status: AC
  Administered 2021-06-19: 458 mg via INTRAVENOUS
  Filled 2021-06-19: qty 22.9

## 2021-06-19 MED ORDER — POTASSIUM CHLORIDE 10 MEQ/100ML IV SOLN
10.0000 meq | INTRAVENOUS | Status: AC
  Administered 2021-06-19 (×2): 10 meq via INTRAVENOUS
  Filled 2021-06-19 (×2): qty 100

## 2021-06-19 MED ORDER — UREA 10 % EX CREA: TOPICAL_CREAM | Freq: Two times a day (BID) | CUTANEOUS | 0 refills | Status: DC

## 2021-06-19 NOTE — Patient Instructions (Signed)
[place] at [facility] ?Discharge Instructions ? ? ?You were seen and examined today by Dr. Delton Coombes. ? ?He reviewed the results of your CT scan which was good.  ? ?You've lost an additional 4 lbs. We will reach out to speech language pathology for a swallow study evaluation to try to determine why you are having difficulty swallowing. ? ?We will send urea cream for you to use on your hands to help with the peeling. Use twice a day.  ? ?Return as scheduled in 2 weeks.  ? ? ?Thank you for choosing [place] at [facility] to provide your oncology and hematology care.  To afford each patient quality time with our provider, please arrive at least 15 minutes before your scheduled appointment time.  ? ?If you have a lab appointment with the [facility] please come in thru the Main Entrance and check in at the main information desk. ? ?You need to re-schedule your appointment should you arrive 10 or more minutes late.  We strive to give you quality time with our providers, and arriving late affects you and other patients whose appointments are after yours.  Also, if you no show three or more times for appointments you may be dismissed from the clinic at the providers discretion.     ?Again, thank you for choosing [facility].  Our hope is that these requests will decrease the amount of time that you wait before being seen by our physicians.       ?_____________________________________________________________ ? ?Should you have questions after your visit to [facility], please contact our office at [phone] and follow the prompts.  Our office hours are 8:00 a.m. and 4:30 p.m. Monday - Friday.  Please note that voicemails left after 4:00 p.m. may not be returned until the following business day.  We are closed weekends and major holidays.  You do have access to a nurse 24-7, just call the main number to the clinic [phone] and do not  press any options, hold on the line and a nurse will answer the phone.   ? ?For prescription refill requests, have your pharmacy contact our office and allow 72 hours.   ? ?Due to Covid, you will need to wear a mask upon entering the hospital. If you do not have a mask, a mask will be given to you at the Main Entrance upon arrival. For doctor visits, patients may have 1 support person age 90 or older with them. For treatment visits, patients can not have anyone with them due to social distancing guidelines and our immunocompromised population.  ? ?   ?

## 2021-06-19 NOTE — Progress Notes (Signed)
Patient has been examined by Dr. Katragadda, and vital signs and labs have been reviewed. ANC, Creatinine, LFTs, hemoglobin, and platelets are within treatment parameters per M.D. - pt may proceed with treatment.    °

## 2021-06-19 NOTE — Progress Notes (Signed)
Adjust doses of chemotherapy with new BSA 1.72 m2 ? ?T.O. Dr Rhys Martini, PharmD ?

## 2021-06-19 NOTE — Progress Notes (Signed)
Patient presents today for treatment, patient assessed by Dr. Delton Coombes and is okay for treatment. Potassium 2.8 and Magnesium 1.6 Dr. Delton Coombes made aware and RN received orders for 400 mg of Magnesium Oxide and 40 mEqPO potassium/ 2g Potassium IV/ 40 mEq PO potassium.  ?Patient tolerated chemotherapy with no complaints voiced. Side effects with management reviewed understanding verbalized. Port site clean and dry with no bruising or swelling noted at site. Good blood return noted before and after administration of chemotherapy. Chemo pump connected with no alarms noted. Patient left in satisfactory condition with VSS and no s/s of distress noted.  ?

## 2021-06-19 NOTE — Progress Notes (Signed)
? ?[place] ?[address] ?[place], [place] [address] ? ? ?CLINIC:  ?Medical Oncology/Hematology ? ?PCP:  ?Hannah Squibb, MD ?[address] [place] [address] ?[phone] ? ? 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 ?PRIOR THERAPY: none ? ?NGS Results: not done ? ?CURRENT THERAPY: FOLFOX q14d x 6 cycles ? ?BRIEF ONCOLOGIC HISTORY:  ?Oncology History  ?Gastric cancer ([place])  ?02/08/2021 Initial Diagnosis  ? Gastric cancer ([place]) ? ?  ?03/14/2021 -  Chemotherapy  ? Patient is on Treatment Plan : GASTROESOPHAGEAL FOLFOX q14d x 6 cycles  ? ?  ?  ? ? ?CANCER STAGING: ? Cancer Staging  ?Gastric cancer ([place]) ?Staging form: Stomach, AJCC 8th Edition ?- Clinical stage from 02/08/2021: Stage IVB (cTX, cN2, cM1) - Unsigned ? ? ?INTERVAL HISTORY:  ?H[place] Knight, a 66 y.o. female, returns for routine follow-up and consideration for next cycle of chemotherapy. [name] was last seen on 06/05/2021. ? ?Due for cycle #7 of FOLFOX today.  ? ?Overall, she tells me she has been feeling pretty well. She has lost 4 lbs since her last visit. Her appetite and eating are poor as she reports she is having trouble swallowing, including soft and blended foods. She denies vaginal bleeding and spotting. She denies nausea. She reports trouble swallowing her potassium tablets. She denies numbness/tingling in her hands and feet. She reports skin peeling on her fingers and pain in her left hand. She denies skin blistering or skin peeling on the bottom of her feet. She reports fatigue and increased sleepiness. She reports she spends most of her day sleeping. She denies pain with swallowing, and she denies mouth sores.   ? ?Overall, she feels ready for next cycle of chemo today.  ? ? ?REVIEW OF SYSTEMS:  ?Review of Systems  ?Constitutional:  Positive for appetite change, fatigue and unexpected weight change (-4 lbs).  ?HENT:   Positive for trouble swallowing.  Negative for mouth sores.   ?Respiratory:  Positive for cough.   ?Gastrointestinal:  Negative for nausea.  ?Genitourinary:  Negative for vaginal bleeding.   ?Musculoskeletal:  Positive for arthralgias (8/10 leg 10/10 hand).  ?Neurological:  Negative for numbness.  ?Psychiatric/Behavioral:  Positive for depression and sleep disturbance. The patient is nervous/anxious.   ?All other systems reviewed and are negative. ? ?PAST MEDICAL/SURGICAL HISTORY:  ?Past Medical History:  ?Diagnosis Date  ? Allergy   ? Arthritis   ? Diabetes mellitus   ? Graves disease   ? with radiation, now on Synthroid  ? Graves disease   ? Hypertension   ? Port-A-Cath in place 03/08/2021  ? ?Past Surgical History:  ?Procedure Laterality Date  ? BIOPSY  01/23/2021  ? Procedure: BIOPSY;  Surgeon: [place] Harman, DO;  [facility];  Service: Endoscopy;;  ? CHOLECYSTECTOMY    ? COLONOSCOPY  09/22/2007  ? SLF: moderate internal hemorrhoids, multiple 3-4 mm sessile polyps in splenic flexure, hyperplastic.   ? COLONOSCOPY  09/27/2002  ? [place]:Small external hemorrhoids/Six tiny   polyps ablated by cold biopsy from sigmoid colon  ? COLONOSCOPY N/A 06/29/2013  ? Moderate sized internal hemorrhoids. Sigmoid diverticulosis. Normal TI. Banding X 3.  ? COLONOSCOPY WITH PROPOFOL N/A 01/23/2021  ? Procedure: COLONOSCOPY WITH PROPOFOL;  Surgeon: [place] Harman, DO;  [facility];  Service: Endoscopy;  Laterality: N/A;  8:00am  ? ESOPHAGOGASTRODUODENOSCOPY (EGD) WITH PROPOFOL N/A 01/23/2021  ? Procedure: ESOPHAGOGASTRODUODENOSCOPY (EGD) WITH  PROPOFOL;  Surgeon: [place] Harman, DO;  [facility];  Service: Endoscopy;  Laterality: N/A;  ? HEMORRHOID BANDING  06/29/2013  ? Procedure: HEMORRHOID BANDING;  Surgeon: Hannah Binder, MD;  [facility]: [facility];  Service: Endoscopy;;  ? IR IMAGING GUIDED PORT INSERTION  02/21/2021  ? IR US GUIDE BX ASP/DRAIN  02/21/2021  ? TUBAL LIGATION    ? ? ?SOCIAL HISTORY:  ?Social History   ? ?Socioeconomic History  ? Marital status: Legally Separated  ?  Spouse name: Not on file  ? Number of children: Not on file  ? Years of education: Not on file  ? Highest education level: Not on file  ?Occupational History  ? 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 Smoking status: Never  ? Smokeless tobacco: Never  ?Vaping Use  ? Vaping Use: Never used  ?Substance and Sexual Activity  ? Alcohol use: No  ? Drug use: No  ? Sexual activity: Not on file  ?Other Topics Concern  ? Not on file  ?Social History Narrative  ? Not on file  ? ?Social Determinants of Health  ? ?Financial Resource Strain: Low Risk   ? Difficulty of Paying Living Expenses: Not very hard  ?Food Insecurity: No Food Insecurity  ? Worried About Charity fundraiser in the Last Year: Never true  ? Ran Out of Food in the Last Year: Never true  ?Transportation Needs: No Transportation Needs  ? Lack of Transportation (Medical): No  ? Lack of Transportation (Non-Medical): No  ?Physical Activity: Not on file  ?Stress: No Stress Concern Present  ? Feeling of Stress : Only a little  ?Social Connections: Moderately Integrated  ? Frequency of Communication with Friends and Family: More than three times a week  ? Frequency of Social Gatherings with Friends and Family: More than three times a week  ? Attends Religious Services: More than 4 times per year  ? Active Member of Clubs or Organizations: Yes  ? Attends Archivist Meetings: More than 4 times per year  ? Marital Status: Separated  ?Intimate Partner Violence: Not on file  ? ? ?FAMILY HISTORY:  ?Family History  ?Problem Relation Age of Onset  ? Colon cancer Father 70  ?     deceased  ? Diabetes Mother   ? Heart disease Mother   ? Cancer - Lung Brother   ? ? ?CURRENT MEDICATIONS:  ?Current Outpatient Medications  ?Medication Sig Dispense Refill  ? acetaminophen (TYLENOL) 650 MG CR tablet Take 1,300 mg by mouth every 8 (eight) hours as needed for pain.    ? amLODipine  (NORVASC) 5 MG tablet Take 5 mg by mouth every evening.    ? APIXABAN (ELIQUIS) VTE STARTER PACK ('10MG'$  AND '5MG'$ ) Take as directed on package: start with two-'5mg'$  tablets twice daily for 7 days. On day 8, switch to one-'5mg'$  tablet twice daily. 1 each 0  ? atorvastatin (LIPITOR) 10 MG tablet Take 1 tablet (10 mg total) by mouth daily. (Patient taking differently: Take 10 mg by mouth at bedtime.) 90 tablet 3  ? fluorouracil CALGB 44967 2,400 mg/m2 in sodium chloride 0.9 % 150 mL Inject 2,400 mg/m2 into the vein over 48 hr.    ? FLUOROURACIL IV Inject into the vein every 14 (fourteen) days.    ? LEUCOVORIN CALCIUM IV Inject into the vein every 14 (fourteen) days.    ? levothyroxine (SYNTHROID) 112 MCG tablet TAKE 1 TABLET BY MOUTH  EVERY MORNING BEFORE BREAKFAST 90 tablet 3  ? losartan (COZAAR) 100 MG tablet Take 1 tablet (100 mg total) by mouth daily. 90 tablet 3  ? magnesium oxide (MAG-OX) 400 (240 Mg) MG tablet Take 1 tablet (400 mg total) by mouth 2 (two) times daily. 60 tablet 3  ? megestrol (MEGACE) 400 MG/10ML suspension Take 10 mLs (400 mg total) by mouth 2 (two) times daily. 480 mL 2  ? metFORMIN (GLUCOPHAGE) 500 MG tablet Take 1 tablet (500 mg total) by mouth 2 (two) times daily with a meal. 120 tablet 5  ? OXALIPLATIN IV Inject into the vein every 14 (fourteen) days.    ? potassium chloride SA (KLOR-CON M) 20 MEQ tablet TAKE 2 TABLETS BY MOUTH EVERY 3 HOURS TODAY, THEN TAKE 2 TABLETS ONCE DAILY THEREAFTER 66 tablet 3  ? scopolamine (TRANSDERM-SCOP) 1 MG/3DAYS Place 1 patch (1.5 mg total) onto the skin every 3 (three) days. 10 patch 12  ? urea (CARMOL) 10 % cream Apply topically 2 (two) times daily. 71 g 0  ? prochlorperazine (COMPAZINE) 10 MG tablet TAKE 1 TABLET(10 MG) BY MOUTH EVERY 6 HOURS AS NEEDED FOR NAUSEA OR VOMITING (Patient not taking: Reported on 06/19/2021) 60 tablet 4  ? ?No current facility-administered medications for this visit.  ? ? ?ALLERGIES:  ?Allergies  ?Allergen Reactions  ? Ace Inhibitors  Anaphylaxis and Swelling  ? ? ?PHYSICAL EXAM:  ?Performance status (ECOG): 0 - Asymptomatic ? ?There were no vitals filed for this visit. ?Wt Readings from Last 3 Encounters:  ?06/19/21 142 lb 3.2 oz (64.5 kg)  ?04/

## 2021-06-19 NOTE — Progress Notes (Signed)
Patients port flushed without difficulty.  Good blood return noted with no bruising or swelling noted at site.  Stable during access and blood draw.  Patient to remain accessed for treatment. 

## 2021-06-19 NOTE — Patient Instructions (Signed)
[facility]  Discharge Instructions: ?Thank you for choosing [facility] to provide your oncology and hematology care.  ?If you have a lab appointment with the [facility], please come in thru the Main Entrance and check in at the main information desk. ? ?Wear comfortable clothing and clothing appropriate for easy access to any Portacath or PICC line.  ? ?We strive to give you quality time with your provider. You may need to reschedule your appointment if you arrive late (15 or more minutes).  Arriving late affects you and other patients whose appointments are after yours.  Also, if you miss three or more appointments without notifying the office, you may be dismissed from the clinic at the provider?s discretion.    ?  ?For prescription refill requests, have your pharmacy contact our office and allow 72 hours for refills to be completed.   ? ?Today you received the following chemotherapy and/or immunotherapy agents FOLFOX, Potassium and Magnesium. Return as scheduled. ?  ?To help prevent nausea and vomiting after your treatment, we encourage you to take your nausea medication as directed. ? ?BELOW ARE SYMPTOMS THAT SHOULD BE REPORTED IMMEDIATELY: ?*FEVER GREATER THAN 100.4 F (38 ?C) OR HIGHER ?*CHILLS OR SWEATING ?*NAUSEA AND VOMITING THAT IS NOT CONTROLLED WITH YOUR NAUSEA MEDICATION ?*UNUSUAL SHORTNESS OF BREATH ?*UNUSUAL BRUISING OR BLEEDING ?*URINARY PROBLEMS (pain or burning when urinating, or frequent urination) ?*BOWEL PROBLEMS (unusual diarrhea, constipation, pain near the anus) ?TENDERNESS IN MOUTH AND THROAT WITH OR WITHOUT PRESENCE OF ULCERS (sore throat, sores in mouth, or a toothache) ?UNUSUAL RASH, SWELLING OR PAIN  ?UNUSUAL VAGINAL DISCHARGE OR ITCHING  ? ?Items with * indicate a potential emergency and should be followed up as soon as possible or go to the Emergency Department if any problems should occur. ? ?Please show the CHEMOTHERAPY ALERT CARD or IMMUNOTHERAPY ALERT  CARD at check-in to the Emergency Department and triage nurse. ? ?Should you have questions after your visit or need to cancel or reschedule your appointment, please contact [facility] [phone]  and follow the prompts.  Office hours are 8:00 a.m. to 4:30 p.m. Monday - Friday. Please note that voicemails left after 4:00 p.m. may not be returned until the following business day.  We are closed weekends and major holidays. You have access to a nurse at all times for urgent questions. Please call the main number to the clinic [phone] and follow the prompts. ? ?For any non-urgent questions, you may also contact your provider using MyChart. We now offer e-Visits for anyone 62 and older to request care online for non-urgent symptoms. For details visit [email or web address]. ?  ?Also download the MyChart app! Go to the app store, search "MyChart", open the app, select [facility], and log in with your MyChart username and password. ? ?Due to Covid, a mask is required upon entering the hospital/clinic. If you do not have a mask, one will be given to you upon arrival. For doctor visits, patients may have 1 support person aged 65 or older with them. For treatment visits, patients cannot have anyone with them due to current Covid guidelines and our immunocompromised population.  ?

## 2021-06-20 LAB — CEA: CEA: 6.3 ng/mL — ABNORMAL HIGH (ref 0.0–4.7)

## 2021-06-21 ENCOUNTER — Encounter (HOSPITAL_COMMUNITY): Payer: Self-pay

## 2021-06-21 ENCOUNTER — Inpatient Hospital Stay (HOSPITAL_COMMUNITY): Payer: 59

## 2021-06-21 VITALS — BP 109/80 | HR 108 | Temp 97.6°F | Resp 16

## 2021-06-21 DIAGNOSIS — C169 Malignant neoplasm of stomach, unspecified: Secondary | ICD-10-CM | POA: Diagnosis not present

## 2021-06-21 DIAGNOSIS — Z95828 Presence of other vascular implants and grafts: Secondary | ICD-10-CM

## 2021-06-21 MED ORDER — HEPARIN SOD (PORK) LOCK FLUSH 100 UNIT/ML IV SOLN
500.0000 [IU] | Freq: Once | INTRAVENOUS | Status: AC | PRN
  Administered 2021-06-21: 500 [IU]

## 2021-06-21 MED ORDER — PEGFILGRASTIM-BMEZ 6 MG/0.6ML ~~LOC~~ SOSY
6.0000 mg | PREFILLED_SYRINGE | Freq: Once | SUBCUTANEOUS | Status: AC
  Administered 2021-06-21: 6 mg via SUBCUTANEOUS
  Filled 2021-06-21: qty 0.6

## 2021-06-21 MED ORDER — SODIUM CHLORIDE 0.9% FLUSH
10.0000 mL | INTRAVENOUS | Status: DC | PRN
  Administered 2021-06-21: 10 mL

## 2021-06-21 NOTE — Patient Instructions (Signed)
[facility]  Discharge Instructions: ?Thank you for choosing [facility] to provide your oncology and hematology care.  ?If you have a lab appointment with the [facility], please come in thru the Main Entrance and check in at the main information desk. ? ?Wear comfortable clothing and clothing appropriate for easy access to any Portacath or PICC line.  ? ?We strive to give you quality time with your provider. You may need to reschedule your appointment if you arrive late (15 or more minutes).  Arriving late affects you and other patients whose appointments are after yours.  Also, if you miss three or more appointments without notifying the office, you may be dismissed from the clinic at the provider?s discretion.    ?  ?For prescription refill requests, have your pharmacy contact our office and allow 72 hours for refills to be completed.   ? ?Today your chemo pump was disconnected and injection given, return as scheduled. ?  ?To help prevent nausea and vomiting after your treatment, we encourage you to take your nausea medication as directed. ? ?BELOW ARE SYMPTOMS THAT SHOULD BE REPORTED IMMEDIATELY: ?*FEVER GREATER THAN 100.4 F (38 ?C) OR HIGHER ?*CHILLS OR SWEATING ?*NAUSEA AND VOMITING THAT IS NOT CONTROLLED WITH YOUR NAUSEA MEDICATION ?*UNUSUAL SHORTNESS OF BREATH ?*UNUSUAL BRUISING OR BLEEDING ?*URINARY PROBLEMS (pain or burning when urinating, or frequent urination) ?*BOWEL PROBLEMS (unusual diarrhea, constipation, pain near the anus) ?TENDERNESS IN MOUTH AND THROAT WITH OR WITHOUT PRESENCE OF ULCERS (sore throat, sores in mouth, or a toothache) ?UNUSUAL RASH, SWELLING OR PAIN  ?UNUSUAL VAGINAL DISCHARGE OR ITCHING  ? ?Items with * indicate a potential emergency and should be followed up as soon as possible or go to the Emergency Department if any problems should occur. ? ?Please show the CHEMOTHERAPY ALERT CARD or IMMUNOTHERAPY ALERT CARD at check-in to the Emergency Department  and triage nurse. ? ?Should you have questions after your visit or need to cancel or reschedule your appointment, please contact [facility] [phone]  and follow the prompts.  Office hours are 8:00 a.m. to 4:30 p.m. Monday - Friday. Please note that voicemails left after 4:00 p.m. may not be returned until the following business day.  We are closed weekends and major holidays. You have access to a nurse at all times for urgent questions. Please call the main number to the clinic [phone] and follow the prompts. ? ?For any non-urgent questions, you may also contact your provider using MyChart. We now offer e-Visits for anyone 40 and older to request care online for non-urgent symptoms. For details visit [email or web address]. ?  ?Also download the MyChart app! Go to the app store, search "MyChart", open the app, select [facility], and log in with your MyChart username and password. ? ?Due to Covid, a mask is required upon entering the hospital/clinic. If you do not have a mask, one will be given to you upon arrival. For doctor visits, patients may have 1 support person aged 61 or older with them. For treatment visits, patients cannot have anyone with them due to current Covid guidelines and our immunocompromised population.  ?

## 2021-06-21 NOTE — Progress Notes (Signed)
Patient tolerated injection with no complaints voiced. Site clean and dry with no bruising or swelling noted at site. See MAR for details. Band aid applied.  Patient stable during and after injection.  ?Chemo pump was disconnected,  Port flushed with good blood return noted. No bruising or swelling at site. Bandaid applied and patient discharged in satisfactory condition. VVS stable with no signs or symptoms of distressed noted.  ?

## 2021-06-29 ENCOUNTER — Other Ambulatory Visit: Payer: Self-pay

## 2021-06-29 ENCOUNTER — Inpatient Hospital Stay (HOSPITAL_COMMUNITY)
Admission: EM | Admit: 2021-06-29 | Discharge: 2021-07-04 | DRG: 811 | Disposition: A | Discharge status: Skilled Nursing Facility | Payer: 59 | Attending: Family Medicine | Admitting: Family Medicine

## 2021-06-29 ENCOUNTER — Emergency Department (HOSPITAL_COMMUNITY): Payer: 59

## 2021-06-29 ENCOUNTER — Encounter (HOSPITAL_COMMUNITY): Payer: Self-pay

## 2021-06-29 DIAGNOSIS — K224 Dyskinesia of esophagus: Secondary | ICD-10-CM | POA: Diagnosis present

## 2021-06-29 DIAGNOSIS — E119 Type 2 diabetes mellitus without complications: Secondary | ICD-10-CM

## 2021-06-29 DIAGNOSIS — C169 Malignant neoplasm of stomach, unspecified: Secondary | ICD-10-CM | POA: Diagnosis present

## 2021-06-29 DIAGNOSIS — Z888 Allergy status to other drugs, medicaments and biological substances status: Secondary | ICD-10-CM

## 2021-06-29 DIAGNOSIS — Z7969 Long term (current) use of other immunomodulators and immunosuppressants: Secondary | ICD-10-CM

## 2021-06-29 DIAGNOSIS — Z9049 Acquired absence of other specified parts of digestive tract: Secondary | ICD-10-CM

## 2021-06-29 DIAGNOSIS — Z86010 Personal history of colonic polyps: Secondary | ICD-10-CM

## 2021-06-29 DIAGNOSIS — E860 Dehydration: Secondary | ICD-10-CM | POA: Diagnosis present

## 2021-06-29 DIAGNOSIS — R112 Nausea with vomiting, unspecified: Secondary | ICD-10-CM

## 2021-06-29 DIAGNOSIS — Z66 Do not resuscitate: Secondary | ICD-10-CM | POA: Diagnosis present

## 2021-06-29 DIAGNOSIS — L8990 Pressure ulcer of unspecified site, unspecified stage: Secondary | ICD-10-CM | POA: Diagnosis present

## 2021-06-29 DIAGNOSIS — E876 Hypokalemia: Secondary | ICD-10-CM | POA: Diagnosis present

## 2021-06-29 DIAGNOSIS — Z95828 Presence of other vascular implants and grafts: Secondary | ICD-10-CM

## 2021-06-29 DIAGNOSIS — D649 Anemia, unspecified: Secondary | ICD-10-CM | POA: Diagnosis present

## 2021-06-29 DIAGNOSIS — R531 Weakness: Secondary | ICD-10-CM | POA: Diagnosis not present

## 2021-06-29 DIAGNOSIS — K769 Liver disease, unspecified: Secondary | ICD-10-CM | POA: Diagnosis present

## 2021-06-29 DIAGNOSIS — Z833 Family history of diabetes mellitus: Secondary | ICD-10-CM

## 2021-06-29 DIAGNOSIS — E440 Moderate protein-calorie malnutrition: Secondary | ICD-10-CM | POA: Diagnosis present

## 2021-06-29 DIAGNOSIS — Z7901 Long term (current) use of anticoagulants: Secondary | ICD-10-CM

## 2021-06-29 DIAGNOSIS — D6481 Anemia due to antineoplastic chemotherapy: Principal | ICD-10-CM | POA: Diagnosis present

## 2021-06-29 DIAGNOSIS — L89152 Pressure ulcer of sacral region, stage 2: Secondary | ICD-10-CM | POA: Diagnosis present

## 2021-06-29 DIAGNOSIS — E039 Hypothyroidism, unspecified: Secondary | ICD-10-CM | POA: Diagnosis present

## 2021-06-29 DIAGNOSIS — C799 Secondary malignant neoplasm of unspecified site: Secondary | ICD-10-CM | POA: Diagnosis present

## 2021-06-29 DIAGNOSIS — Z6823 Body mass index (BMI) 23.0-23.9, adult: Secondary | ICD-10-CM

## 2021-06-29 DIAGNOSIS — Z7401 Bed confinement status: Secondary | ICD-10-CM

## 2021-06-29 DIAGNOSIS — Z8249 Family history of ischemic heart disease and other diseases of the circulatory system: Secondary | ICD-10-CM

## 2021-06-29 DIAGNOSIS — B9620 Unspecified Escherichia coli [E. coli] as the cause of diseases classified elsewhere: Secondary | ICD-10-CM | POA: Diagnosis present

## 2021-06-29 DIAGNOSIS — N390 Urinary tract infection, site not specified: Secondary | ICD-10-CM | POA: Diagnosis present

## 2021-06-29 DIAGNOSIS — T451X5A Adverse effect of antineoplastic and immunosuppressive drugs, initial encounter: Secondary | ICD-10-CM | POA: Diagnosis present

## 2021-06-29 DIAGNOSIS — I2699 Other pulmonary embolism without acute cor pulmonale: Secondary | ICD-10-CM | POA: Diagnosis present

## 2021-06-29 DIAGNOSIS — Z800 Family history of malignant neoplasm of digestive organs: Secondary | ICD-10-CM

## 2021-06-29 DIAGNOSIS — Z79890 Hormone replacement therapy (postmenopausal): Secondary | ICD-10-CM

## 2021-06-29 DIAGNOSIS — E0500 Thyrotoxicosis with diffuse goiter without thyrotoxic crisis or storm: Secondary | ICD-10-CM | POA: Diagnosis present

## 2021-06-29 DIAGNOSIS — Z79899 Other long term (current) drug therapy: Secondary | ICD-10-CM

## 2021-06-29 DIAGNOSIS — R1310 Dysphagia, unspecified: Secondary | ICD-10-CM

## 2021-06-29 DIAGNOSIS — I10 Essential (primary) hypertension: Secondary | ICD-10-CM | POA: Diagnosis present

## 2021-06-29 LAB — URINALYSIS, ROUTINE W REFLEX MICROSCOPIC
Bacteria, UA: NONE SEEN
Bilirubin Urine: NEGATIVE
Glucose, UA: 50 mg/dL — AB
Hgb urine dipstick: NEGATIVE
Ketones, ur: NEGATIVE mg/dL
Leukocytes,Ua: NEGATIVE
Nitrite: NEGATIVE
Protein, ur: 100 mg/dL — AB
Specific Gravity, Urine: 1.029 (ref 1.005–1.030)
pH: 5 (ref 5.0–8.0)

## 2021-06-29 LAB — COMPREHENSIVE METABOLIC PANEL
ALT: 14 U/L (ref 0–44)
AST: 22 U/L (ref 15–41)
Albumin: 3.5 g/dL (ref 3.5–5.0)
Alkaline Phosphatase: 105 U/L (ref 38–126)
Anion gap: 11 (ref 5–15)
BUN: 23 mg/dL (ref 8–23)
CO2: 18 mmol/L — ABNORMAL LOW (ref 22–32)
Calcium: 9.7 mg/dL (ref 8.9–10.3)
Chloride: 106 mmol/L (ref 98–111)
Creatinine, Ser: 1.35 mg/dL — ABNORMAL HIGH (ref 0.44–1.00)
GFR, Estimated: 44 mL/min — ABNORMAL LOW (ref >60)
Glucose, Bld: 204 mg/dL — ABNORMAL HIGH (ref 70–99)
Potassium: 3.6 mmol/L (ref 3.5–5.1)
Sodium: 135 mmol/L (ref 135–145)
Total Bilirubin: 0.6 mg/dL (ref 0.3–1.2)
Total Protein: 7.2 g/dL (ref 6.5–8.1)

## 2021-06-29 LAB — MAGNESIUM: Magnesium: 1.8 mg/dL (ref 1.7–2.4)

## 2021-06-29 LAB — CBC WITH DIFFERENTIAL/PLATELET
Abs Immature Granulocytes: 0.09 10*3/uL — ABNORMAL HIGH (ref 0.00–0.07)
Basophils Absolute: 0 10*3/uL (ref 0.0–0.1)
Basophils Relative: 1 %
Eosinophils Absolute: 0 10*3/uL (ref 0.0–0.5)
Eosinophils Relative: 0 %
HCT: 27.9 % — ABNORMAL LOW (ref 36.0–46.0)
Hemoglobin: 9.1 g/dL — ABNORMAL LOW (ref 12.0–15.0)
Immature Granulocytes: 2 %
Lymphocytes Relative: 22 %
Lymphs Abs: 1.3 10*3/uL (ref 0.7–4.0)
MCH: 29.4 pg (ref 26.0–34.0)
MCHC: 32.6 g/dL (ref 30.0–36.0)
MCV: 90.3 fL (ref 80.0–100.0)
Monocytes Absolute: 0.9 10*3/uL (ref 0.1–1.0)
Monocytes Relative: 15 %
Neutro Abs: 3.5 10*3/uL (ref 1.7–7.7)
Neutrophils Relative %: 60 %
Platelets: 203 10*3/uL (ref 150–400)
RBC: 3.09 MIL/uL — ABNORMAL LOW (ref 3.87–5.11)
RDW: 17.6 % — ABNORMAL HIGH (ref 11.5–15.5)
WBC: 5.7 10*3/uL (ref 4.0–10.5)
nRBC: 0 % (ref 0.0–0.2)

## 2021-06-29 LAB — APTT: aPTT: 30 seconds (ref 24–36)

## 2021-06-29 LAB — PROTIME-INR
INR: 2.2 — ABNORMAL HIGH (ref 0.8–1.2)
Prothrombin Time: 24.2 seconds — ABNORMAL HIGH (ref 11.4–15.2)

## 2021-06-29 LAB — TROPONIN I (HIGH SENSITIVITY): Troponin I (High Sensitivity): 14 ng/L (ref <18)

## 2021-06-29 LAB — LACTIC ACID, PLASMA
Lactic Acid, Venous: 3 mmol/L (ref 0.5–1.9)
Lactic Acid, Venous: 1.6 mmol/L (ref 0.5–1.9)

## 2021-06-29 MED ORDER — SODIUM CHLORIDE 0.9 % IV SOLN: INTRAVENOUS | Status: AC

## 2021-06-29 MED ORDER — IOHEXOL 300 MG/ML  SOLN
100.0000 mL | Freq: Once | INTRAMUSCULAR | Status: AC | PRN
  Administered 2021-06-29: 100 mL via INTRAVENOUS

## 2021-06-29 MED ORDER — ONDANSETRON HCL 4 MG/2ML IJ SOLN
4.0000 mg | Freq: Once | INTRAMUSCULAR | Status: AC
  Administered 2021-06-29: 4 mg via INTRAVENOUS
  Filled 2021-06-29: qty 2

## 2021-06-29 MED ORDER — SODIUM CHLORIDE 0.9 % IV BOLUS
1000.0000 mL | Freq: Once | INTRAVENOUS | Status: AC
  Administered 2021-06-29: 1000 mL via INTRAVENOUS

## 2021-06-29 NOTE — ED Triage Notes (Signed)
Pt brought to ED via [facility] for generalized weakness x 2-3 days. Pt has not been able to eat or drink, CBG 249. BP 140/104 O2 100% RR 18 HR 114-120. Last cancer treatment last week.  ?

## 2021-06-29 NOTE — ED Provider Notes (Signed)
?[facility] ?Provider Note ? ? ?CSN: [id number] ?Arrival date & time: 06/29/21  1706 ? ?  ? ?History ? ?Chief Complaint  ?Patient presents with  ? Weakness  ? ? ?Hannah Knight is a 66 y.o. female. ? ? ?Weakness ? ?This patient is a 66 year old female, she is on Eliquis, she also takes Lipitor, she is currently on losartan, megestrol, metformin as well as Compazine for nausea.  The patient was recently diagnosed with a poorly differentiated stage IV gastric carcinoma.  She is currently on chemotherapy and last had this last week.  She was most recently seen on April 18 during which time she stated that she was feeling okay, she had still lost about 4 pounds and had some difficulty with her appetite but overall has been doing okay. ? ?Since that time over the last several days the patient has had increasing weakness, she is now having difficulty even ambulating to the bathroom due to severe weakness feeling like her legs are getting give out.  She has not seen any blood in her stools and denies any fevers chills or diarrhea, she has not had abdominal pain chest pain but has had the occasional cough.  She does report that she has been trying to drink some soup today but has held some of it down and vomited some of it up.  She called paramedics for transport, they noted that she was tachycardic but otherwise had unremarkable vital signs. ? ?Home Medications ?Prior to Admission medications   ?Medication Sig Start Date End Date Taking? Authorizing Provider  ?acetaminophen (TYLENOL) 650 MG CR tablet Take 1,300 mg by mouth every 8 (eight) hours as needed for pain.   Yes [provider]  ?APIXABAN (ELIQUIS) VTE STARTER PACK ('10MG'$  AND '5MG'$ ) Take as directed on package: start with two-'5mg'$  tablets twice daily for 7 days. On day 8, switch to one-'5mg'$  tablet twice daily. 06/12/21  Yes Derek Jack, MD  ?fluorouracil CALGB 37902 2,400 mg/m2 in sodium chloride 0.9 % 150 mL Inject 2,400  mg/m2 into the vein over 48 hr. 03/13/21  Yes [provider]  ?FLUOROURACIL IV Inject into the vein every 14 (fourteen) days. 03/13/21  Yes [provider]  ?LEUCOVORIN CALCIUM IV Inject into the vein every 14 (fourteen) days. 03/13/21  Yes [provider]  ?levothyroxine (SYNTHROID) 112 MCG tablet TAKE 1 TABLET BY MOUTH EVERY MORNING BEFORE BREAKFAST 12/27/19  Yes Brita Romp, NP  ?magnesium oxide (MAG-OX) 400 (240 Mg) MG tablet Take 1 tablet (400 mg total) by mouth 2 (two) times daily. 03/28/21  Yes Derek Jack, MD  ?OXALIPLATIN IV Inject into the vein every 14 (fourteen) days. 03/13/21  Yes [provider]  ?prochlorperazine (COMPAZINE) 10 MG tablet TAKE 1 TABLET(10 MG) BY MOUTH EVERY 6 HOURS AS NEEDED FOR NAUSEA OR VOMITING 03/22/21  Yes Derek Jack, MD  ?urea (CARMOL) 10 % cream Apply topically 2 (two) times daily. 06/19/21  Yes Derek Jack, MD  ?atorvastatin (LIPITOR) 10 MG tablet Take 1 tablet (10 mg total) by mouth daily. ?Patient not taking: Reported on 06/29/2021 02/22/19   Maryruth Hancock, MD  ?losartan (COZAAR) 100 MG tablet Take 1 tablet (100 mg total) by mouth daily. ?Patient not taking: Reported on 06/29/2021 02/22/19   Maryruth Hancock, MD  ?megestrol (MEGACE) 400 MG/10ML suspension Take 10 mLs (400 mg total) by mouth 2 (two) times daily. ?Patient not taking: Reported on 06/29/2021 03/08/21   Derek Jack, MD  ?metFORMIN (GLUCOPHAGE) 500 MG  tablet Take 1 tablet (500 mg total) by mouth 2 (two) times daily with a meal. ?Patient not taking: Reported on 06/29/2021 02/22/19   Maryruth Hancock, MD  ?potassium chloride SA (KLOR-CON M) 20 MEQ tablet TAKE 2 TABLETS BY MOUTH EVERY 3 HOURS TODAY, THEN TAKE 2 TABLETS ONCE DAILY THEREAFTER ?Patient not taking: Reported on 06/29/2021 06/14/21   Derek Jack, MD  ?scopolamine (TRANSDERM-SCOP) 1 MG/3DAYS Place 1 patch (1.5 mg total) onto the skin every 3 (three) days. ?Patient not taking: Reported on  06/29/2021 03/08/21   Derek Jack, MD  ?   ? ?Allergies    ?Ace inhibitors   ? ?Review of Systems   ?Review of Systems  ?Neurological:  Positive for weakness.  ?All other systems reviewed and are negative. ? ?Physical Exam ?Updated Vital Signs ?BP 118/73   Pulse 77   Temp 98.1 ?F (36.7 ?C) (Oral)   Resp 17   Ht 1.651 m ('5\' 5"'$ )   Wt 64.5 kg   SpO2 100%   BMI 23.66 kg/m?  ?Physical Exam ?Vitals and nursing note reviewed.  ?Constitutional:   ?   General: She is not in acute distress. ?   Appearance: She is well-developed.  ?HENT:  ?   Head: Normocephalic and atraumatic.  ?   Mouth/Throat:  ?   Pharynx: No oropharyngeal exudate.  ?   Comments: Pale mucous membrane ?Eyes:  ?   General: No scleral icterus.    ?   Right eye: No discharge.     ?   Left eye: No discharge.  ?   Pupils: Pupils are equal, round, and reactive to light.  ?   Comments: Pale conjunctive a  ?Neck:  ?   Thyroid: No thyromegaly.  ?   Vascular: No JVD.  ?Cardiovascular:  ?   Rate and Rhythm: Regular rhythm. Tachycardia present.  ?   Heart sounds: Normal heart sounds. No murmur heard. ?  No friction rub. No gallop.  ?   Comments: Pulse around 105-110 ?Pulmonary:  ?   Effort: Pulmonary effort is normal. No respiratory distress.  ?   Breath sounds: Normal breath sounds. No wheezing or rales.  ?Abdominal:  ?   General: Bowel sounds are normal. There is no distension.  ?   Palpations: Abdomen is soft. There is no mass.  ?   Tenderness: There is abdominal tenderness.  ?   Comments: Mild upper abdominal tenderness but no guarding, no lower abdominal tenderness  ?Musculoskeletal:     ?   General: No tenderness. Normal range of motion.  ?   Cervical back: Normal range of motion and neck supple.  ?   Right lower leg: No edema.  ?   Left lower leg: No edema.  ?Lymphadenopathy:  ?   Cervical: No cervical adenopathy.  ?Skin: ?   General: Skin is warm and dry.  ?   Findings: No erythema or rash.  ?Neurological:  ?   Mental Status: She is alert.  ?    Coordination: Coordination normal.  ?   Comments: Diffusely generally weak, no focal abnormalities, cranial nerves III through XII are normal, answers in full sentences  ?Psychiatric:     ?   Behavior: Behavior normal.  ? ? ?ED Results / Procedures / Treatments   ?Labs ?(all labs ordered are listed, but only abnormal results are displayed) ?Labs Reviewed  ?LACTIC ACID, PLASMA - Abnormal; Notable for the following components:  ?    Result Value  ? Lactic Acid,  Venous 3.0 (*)   ? All other components within normal limits  ?COMPREHENSIVE METABOLIC PANEL - Abnormal; Notable for the following components:  ? CO2 18 (*)   ? Glucose, Bld 204 (*)   ? Creatinine, Ser 1.35 (*)   ? GFR, Estimated 44 (*)   ? All other components within normal limits  ?CBC WITH DIFFERENTIAL/PLATELET - Abnormal; Notable for the following components:  ? RBC 3.09 (*)   ? Hemoglobin 9.1 (*)   ? HCT 27.9 (*)   ? RDW 17.6 (*)   ? Abs Immature Granulocytes 0.09 (*)   ? All other components within normal limits  ?PROTIME-INR - Abnormal; Notable for the following components:  ? Prothrombin Time 24.2 (*)   ? INR 2.2 (*)   ? All other components within normal limits  ?URINALYSIS, ROUTINE W REFLEX MICROSCOPIC - Abnormal; Notable for the following components:  ? Color, Urine AMBER (*)   ? APPearance HAZY (*)   ? Glucose, UA 50 (*)   ? Protein, ur 100 (*)   ? All other components within normal limits  ?CULTURE, BLOOD (ROUTINE X 2)  ?CULTURE, BLOOD (ROUTINE X 2)  ?URINE CULTURE  ?LACTIC ACID, PLASMA  ?APTT  ?MAGNESIUM  ?TROPONIN I (HIGH SENSITIVITY)  ? ? ?EKG ?EKG Interpretation ? ?Date/Time:  Friday June 29 2021 17:24:44 EDT ?Ventricular Rate:  102 ?PR Interval:  116 ?QRS Duration: 76 ?QT Interval:  313 ?QTC Calculation: 408 ?R Axis:   12 ?Text Interpretation: Sinus tachycardia No old tracing to compare Confirmed by Noemi Chapel [phone]) on 06/29/2021 5:41:43 PM ? ?Radiology ?CT ABDOMEN PELVIS W CONTRAST ? ?Result Date: 06/29/2021 ?CLINICAL DATA:  Acute  abdominal pain. Weakness. History of gastric cancer, active chemotherapy. Technologist notes state reports history of cervical cancer EXAM: CT ABDOMEN AND PELVIS WITH CONTRAST TECHNIQUE: Multidetector CT imaging of the abd

## 2021-06-29 NOTE — ED Notes (Signed)
Lab called critical value of Lactic Acid 3.0. Dr Sabra Heck notified  ?

## 2021-06-30 ENCOUNTER — Encounter (HOSPITAL_COMMUNITY): Payer: Self-pay | Admitting: Family Medicine

## 2021-06-30 DIAGNOSIS — R531 Weakness: Secondary | ICD-10-CM

## 2021-06-30 DIAGNOSIS — I2699 Other pulmonary embolism without acute cor pulmonale: Secondary | ICD-10-CM | POA: Diagnosis present

## 2021-06-30 DIAGNOSIS — R112 Nausea with vomiting, unspecified: Secondary | ICD-10-CM

## 2021-06-30 LAB — CBC WITH DIFFERENTIAL/PLATELET
Abs Immature Granulocytes: 0.14 10*3/uL — ABNORMAL HIGH (ref 0.00–0.07)
Basophils Absolute: 0 10*3/uL (ref 0.0–0.1)
Basophils Relative: 0 %
Eosinophils Absolute: 0 10*3/uL (ref 0.0–0.5)
Eosinophils Relative: 0 %
HCT: 23.4 % — ABNORMAL LOW (ref 36.0–46.0)
Hemoglobin: 7.6 g/dL — ABNORMAL LOW (ref 12.0–15.0)
Immature Granulocytes: 2 %
Lymphocytes Relative: 23 %
Lymphs Abs: 1.6 10*3/uL (ref 0.7–4.0)
MCH: 29.5 pg (ref 26.0–34.0)
MCHC: 32.5 g/dL (ref 30.0–36.0)
MCV: 90.7 fL (ref 80.0–100.0)
Monocytes Absolute: 0.9 10*3/uL (ref 0.1–1.0)
Monocytes Relative: 14 %
Neutro Abs: 4.2 10*3/uL (ref 1.7–7.7)
Neutrophils Relative %: 61 %
Platelets: 173 10*3/uL (ref 150–400)
RBC: 2.58 MIL/uL — ABNORMAL LOW (ref 3.87–5.11)
RDW: 17.7 % — ABNORMAL HIGH (ref 11.5–15.5)
WBC: 6.9 10*3/uL (ref 4.0–10.5)
nRBC: 0 % (ref 0.0–0.2)

## 2021-06-30 LAB — COMPREHENSIVE METABOLIC PANEL
ALT: 11 U/L (ref 0–44)
AST: 13 U/L — ABNORMAL LOW (ref 15–41)
Albumin: 2.7 g/dL — ABNORMAL LOW (ref 3.5–5.0)
Alkaline Phosphatase: 83 U/L (ref 38–126)
Anion gap: 5 (ref 5–15)
BUN: 19 mg/dL (ref 8–23)
CO2: 19 mmol/L — ABNORMAL LOW (ref 22–32)
Calcium: 8.6 mg/dL — ABNORMAL LOW (ref 8.9–10.3)
Chloride: 112 mmol/L — ABNORMAL HIGH (ref 98–111)
Creatinine, Ser: 1.02 mg/dL — ABNORMAL HIGH (ref 0.44–1.00)
GFR, Estimated: >60 mL/min (ref >60)
Glucose, Bld: 117 mg/dL — ABNORMAL HIGH (ref 70–99)
Potassium: 3.1 mmol/L — ABNORMAL LOW (ref 3.5–5.1)
Sodium: 136 mmol/L (ref 135–145)
Total Bilirubin: 0.5 mg/dL (ref 0.3–1.2)
Total Protein: 5.5 g/dL — ABNORMAL LOW (ref 6.5–8.1)

## 2021-06-30 LAB — RETICULOCYTES
Immature Retic Fract: 30.8 % — ABNORMAL HIGH (ref 2.3–15.9)
RBC.: 2.55 MIL/uL — ABNORMAL LOW (ref 3.87–5.11)
Retic Count, Absolute: 47.4 10*3/uL (ref 19.0–186.0)
Retic Ct Pct: 1.9 % (ref 0.4–3.1)

## 2021-06-30 LAB — GLUCOSE, CAPILLARY
Glucose-Capillary: 101 mg/dL — ABNORMAL HIGH (ref 70–99)
Glucose-Capillary: 102 mg/dL — ABNORMAL HIGH (ref 70–99)
Glucose-Capillary: 85 mg/dL (ref 70–99)
Glucose-Capillary: 125 mg/dL — ABNORMAL HIGH (ref 70–99)

## 2021-06-30 LAB — IRON AND TIBC
Iron: 20 ug/dL — ABNORMAL LOW (ref 28–170)
Saturation Ratios: 10 % — ABNORMAL LOW (ref 10.4–31.8)
TIBC: 207 ug/dL — ABNORMAL LOW (ref 250–450)
UIBC: 187 ug/dL

## 2021-06-30 LAB — HEMOGLOBIN A1C
Hgb A1c MFr Bld: 6.5 % — ABNORMAL HIGH (ref 4.8–5.6)
Mean Plasma Glucose: 139.85 mg/dL

## 2021-06-30 LAB — FOLATE: Folate: >40 ng/mL (ref >5.9)

## 2021-06-30 LAB — HIV ANTIBODY (ROUTINE TESTING W REFLEX): HIV Screen 4th Generation wRfx: NONREACTIVE

## 2021-06-30 LAB — TSH: TSH: 4.538 u[IU]/mL — ABNORMAL HIGH (ref 0.350–4.500)

## 2021-06-30 LAB — VITAMIN B12: Vitamin B-12: 3156 pg/mL — ABNORMAL HIGH (ref 180–914)

## 2021-06-30 LAB — T4, FREE: Free T4: 1.94 ng/dL — ABNORMAL HIGH (ref 0.61–1.12)

## 2021-06-30 LAB — FERRITIN: Ferritin: 2540 ng/mL — ABNORMAL HIGH (ref 11–307)

## 2021-06-30 LAB — MAGNESIUM: Magnesium: 1.6 mg/dL — ABNORMAL LOW (ref 1.7–2.4)

## 2021-06-30 MED ORDER — MAGNESIUM SULFATE 4 GM/100ML IV SOLN
4.0000 g | Freq: Once | INTRAVENOUS | Status: AC
  Administered 2021-06-30: 4 g via INTRAVENOUS
  Filled 2021-06-30: qty 100

## 2021-06-30 MED ORDER — SODIUM CHLORIDE 0.9 % IV SOLN: INTRAVENOUS | Status: DC

## 2021-06-30 MED ORDER — OXYCODONE HCL 5 MG PO TABS
5.0000 mg | ORAL_TABLET | ORAL | Status: DC | PRN
  Administered 2021-06-30 – 2021-07-04 (×5): 5 mg via ORAL
  Filled 2021-06-30 (×5): qty 1

## 2021-06-30 MED ORDER — PROCHLORPERAZINE EDISYLATE 10 MG/2ML IJ SOLN: 10.0000 mg | Freq: Four times a day (QID) | INTRAMUSCULAR | Status: DC | PRN

## 2021-06-30 MED ORDER — ACETAMINOPHEN 650 MG RE SUPP: 650.0000 mg | Freq: Four times a day (QID) | RECTAL | Status: DC | PRN

## 2021-06-30 MED ORDER — ONDANSETRON HCL 4 MG PO TABS: 4.0000 mg | ORAL_TABLET | Freq: Four times a day (QID) | ORAL | Status: DC | PRN

## 2021-06-30 MED ORDER — BOOST / RESOURCE BREEZE PO LIQD CUSTOM
1.0000 | Freq: Three times a day (TID) | ORAL | Status: DC
  Administered 2021-06-30 – 2021-07-03 (×8): 1 via ORAL

## 2021-06-30 MED ORDER — POTASSIUM CHLORIDE 10 MEQ/100ML IV SOLN
10.0000 meq | INTRAVENOUS | Status: AC
  Administered 2021-06-30 (×4): 10 meq via INTRAVENOUS
  Filled 2021-06-30 (×4): qty 100

## 2021-06-30 MED ORDER — ONDANSETRON HCL 4 MG/2ML IJ SOLN: 4.0000 mg | Freq: Four times a day (QID) | INTRAMUSCULAR | Status: DC | PRN

## 2021-06-30 MED ORDER — ENSURE ENLIVE PO LIQD
237.0000 mL | Freq: Two times a day (BID) | ORAL | Status: DC
  Administered 2021-06-30 – 2021-07-04 (×8): 237 mL via ORAL

## 2021-06-30 MED ORDER — THIAMINE HCL 100 MG/ML IJ SOLN
500.0000 mg | Freq: Every day | INTRAVENOUS | Status: DC
  Filled 2021-06-30 (×2): qty 5

## 2021-06-30 MED ORDER — APIXABAN (ELIQUIS) VTE STARTER PACK (10MG AND 5MG): 5.0000 mg | ORAL_TABLET | Freq: Two times a day (BID) | ORAL | Status: DC

## 2021-06-30 MED ORDER — APIXABAN 5 MG PO TABS
5.0000 mg | ORAL_TABLET | Freq: Two times a day (BID) | ORAL | Status: DC
  Administered 2021-06-30 – 2021-07-04 (×9): 5 mg via ORAL
  Filled 2021-06-30 (×12): qty 1

## 2021-06-30 MED ORDER — LEVOTHYROXINE SODIUM 112 MCG PO TABS
112.0000 ug | ORAL_TABLET | Freq: Every day | ORAL | Status: DC
  Administered 2021-06-30 – 2021-07-01 (×2): 112 ug via ORAL
  Filled 2021-06-30 (×2): qty 1

## 2021-06-30 MED ORDER — ACETAMINOPHEN 325 MG PO TABS: 650.0000 mg | ORAL_TABLET | Freq: Four times a day (QID) | ORAL | Status: DC | PRN

## 2021-06-30 MED ORDER — INSULIN ASPART 100 UNIT/ML IJ SOLN
0.0000 [IU] | Freq: Three times a day (TID) | INTRAMUSCULAR | Status: DC
  Administered 2021-07-02 – 2021-07-04 (×2): 2 [IU] via SUBCUTANEOUS

## 2021-06-30 NOTE — Hospital Course (Signed)
66 y.o. female with medical history significant of with history of diabetes mellitus type 2, Graves' disease, hypertension, and more presents to the ED with a chief complaint of generalized weakness.  Patient reports that started 2 days ago.  Has been progressively worse since it started.  Now she cannot walk.  She reports that the weakness is not asymmetric.  At baseline she is able to walk without any assistive device.  She lives with her sister who is able to help her, but she is mostly independent.  She reports a cramping pain in her bilateral upper extremities.  She reports that this has been going on since she has been on chemo, but it seems worse for the last couple of days.  Patient also reports dysphagia as its been going on since November.  She can only swallow liquids and soft foods.  She reports she been taking boost and Ensure at home.  Patient denies any fevers, cough, nausea/vomiting, abdominal pain, dysuria.  The generalized weakness is the specific complaint. ?  ?Patient is currently undergoing treatment for gastric adenocarcinoma.  Her last chemo was a week before last, her next treatment is May 11.  Patient follows with Dr. Delton Coombes who has been consulted. ?  ?Patient reports she does not smoke, does not drink, does not use illicit drugs.  She is vaccinated for COVID.  Patient is DNR. ?

## 2021-06-30 NOTE — Assessment & Plan Note (Addendum)
-  2/2 chemo, deconditioning, poor PO intake and anemia  ?--PT eval and treat recommending SNF.  TOC working on SNF placement.   ?-Continue to monitor ?-transfused 2 unit PRBC on 4/30  ? ?

## 2021-06-30 NOTE — Assessment & Plan Note (Addendum)
Last chemo week before last ?Next chemo scheduled for May 11th ?Follow up with oncology after discharge ? ? ?

## 2021-06-30 NOTE — Assessment & Plan Note (Addendum)
-  Hold metformin ?-sliding scale coverage ?-Continue to monitor ?CBG (last 3)  ?Recent Labs  ?  07/02/21 ?1626 07/02/21 ?2154 07/03/21 ?0751  ?GLUCAP 118* 145* 118*  ? ? ?

## 2021-06-30 NOTE — Assessment & Plan Note (Addendum)
--   continue apixaban for full anticoagulation ?-- no evidence of GI bleeding at this time but following ?

## 2021-06-30 NOTE — H&P (Signed)
?History and Physical  ? ? ?Patient: Hannah Knight DOB: 1955-11-28 ?DOA: 06/29/2021 ?DOS: the patient was seen and examined on 06/30/2021 ?PCP: Celene Squibb, MD  ?Patient coming from: Home ? ?Chief Complaint:  ?Chief Complaint  ?Patient presents with  ? Weakness  ? ?HPI: Hannah Knight is a 66 y.o. female with medical history significant of with history of diabetes mellitus type 2, Graves' disease, hypertension, and more presents to the ED with a chief complaint of generalized weakness.  Patient reports that started 2 days ago.  Has been progressively worse since it started.  Now she cannot walk.  She reports that the weakness is not asymmetric.  At baseline she is able to walk without any assistive device.  She lives with her sister who is able to help her, but she is mostly independent.  She reports a cramping pain in her bilateral upper extremities.  She reports that this has been going on since she has been on chemo, but it seems worse for the last couple of days.  Patient also reports dysphagia as its been going on since November.  She can only swallow liquids and soft foods.  She reports she been taking boost and Ensure at home.  Patient denies any fevers, cough, nausea/vomiting, abdominal pain, dysuria.  The generalized weakness is the specific complaint. ? ?Patient is currently undergoing treatment for gastric adenocarcinoma.  Her last chemo was a week before last, her next treatment is May 11.  Patient follows with Dr. Delton Coombes who has been consulted. ? ?Patient reports she does not smoke, does not drink, does not use illicit drugs.  She is vaccinated for COVID.  Patient is DNR. ?Review of Systems: As mentioned in the history of present illness. All other systems reviewed and are negative. ?Past Medical History:  ?Diagnosis Date  ? Allergy   ? Arthritis   ? Diabetes mellitus   ? Graves disease   ? with radiation, now on Synthroid  ? Graves disease   ? Hypertension   ?  Port-A-Cath in place 03/08/2021  ? ?Past Surgical History:  ?Procedure Laterality Date  ? BIOPSY  01/23/2021  ? Procedure: BIOPSY;  Surgeon: [name], DO;  [facility];  Service: Endoscopy;;  ? CHOLECYSTECTOMY    ? COLONOSCOPY  09/22/2007  ? SLF: moderate internal hemorrhoids, multiple 3-4 mm sessile polyps in splenic flexure, hyperplastic.   ? COLONOSCOPY  09/27/2002  ? Rehman:Small external hemorrhoids/Six tiny   polyps ablated by cold biopsy from sigmoid colon  ? COLONOSCOPY N/A 06/29/2013  ? Moderate sized internal hemorrhoids. Sigmoid diverticulosis. Normal TI. Banding X 3.  ? COLONOSCOPY WITH PROPOFOL N/A 01/23/2021  ? Procedure: COLONOSCOPY WITH PROPOFOL;  Surgeon: [name], DO;  Location: [facility];  Service: Endoscopy;  Laterality: N/A;  8:00am  ? ESOPHAGOGASTRODUODENOSCOPY (EGD) WITH PROPOFOL N/A 01/23/2021  ? Procedure: ESOPHAGOGASTRODUODENOSCOPY (EGD) WITH PROPOFOL;  Surgeon: [name], DO;  [facility]: [facility];  Service: Endoscopy;  Laterality: N/A;  ? HEMORRHOID BANDING  06/29/2013  ? Procedure: HEMORRHOID BANDING;  Surgeon: Danie Binder, MD;  Location: [facility];  Service: Endoscopy;;  ? IR IMAGING GUIDED PORT INSERTION  02/21/2021  ? IR US GUIDE BX ASP/DRAIN  02/21/2021  ? TUBAL LIGATION    ? ?Social History:  reports that she has never smoked. She has never used smokeless tobacco. She reports that she does not drink alcohol and does not use drugs. ? ?Allergies  ?Allergen Reactions  ?  Ace Inhibitors Anaphylaxis and Swelling  ? ? ?Family History  ?Problem Relation Age of Onset  ? Colon cancer Father 2  ?     deceased  ? Diabetes Mother   ? Heart disease Mother   ? Cancer - Lung Brother   ? ? ?Prior to Admission medications   ?Medication Sig Start Date End Date Taking? Authorizing Provider  ?acetaminophen (TYLENOL) 650 MG CR tablet Take 1,300 mg by mouth every 8 (eight) hours as needed for pain.   Yes [provider]  ?APIXABAN (ELIQUIS) VTE  STARTER PACK ('10MG'$  AND '5MG'$ ) Take as directed on package: start with two-'5mg'$  tablets twice daily for 7 days. On day 8, switch to one-'5mg'$  tablet twice daily. 06/12/21  Yes Derek Jack, MD  ?fluorouracil CALGB 78295 2,400 mg/m2 in sodium chloride 0.9 % 150 mL Inject 2,400 mg/m2 into the vein over 48 hr. 03/13/21  Yes [provider]  ?FLUOROURACIL IV Inject into the vein every 14 (fourteen) days. 03/13/21  Yes [provider]  ?LEUCOVORIN CALCIUM IV Inject into the vein every 14 (fourteen) days. 03/13/21  Yes [provider]  ?levothyroxine (SYNTHROID) 112 MCG tablet TAKE 1 TABLET BY MOUTH EVERY MORNING BEFORE BREAKFAST 12/27/19  Yes Brita Romp, NP  ?magnesium oxide (MAG-OX) 400 (240 Mg) MG tablet Take 1 tablet (400 mg total) by mouth 2 (two) times daily. 03/28/21  Yes Derek Jack, MD  ?OXALIPLATIN IV Inject into the vein every 14 (fourteen) days. 03/13/21  Yes [provider]  ?prochlorperazine (COMPAZINE) 10 MG tablet TAKE 1 TABLET(10 MG) BY MOUTH EVERY 6 HOURS AS NEEDED FOR NAUSEA OR VOMITING 03/22/21  Yes Derek Jack, MD  ?urea (CARMOL) 10 % cream Apply topically 2 (two) times daily. 06/19/21  Yes Derek Jack, MD  ?atorvastatin (LIPITOR) 10 MG tablet Take 1 tablet (10 mg total) by mouth daily. ?Patient not taking: Reported on 06/29/2021 02/22/19   Maryruth Hancock, MD  ?losartan (COZAAR) 100 MG tablet Take 1 tablet (100 mg total) by mouth daily. ?Patient not taking: Reported on 06/29/2021 02/22/19   Maryruth Hancock, MD  ?megestrol (MEGACE) 400 MG/10ML suspension Take 10 mLs (400 mg total) by mouth 2 (two) times daily. ?Patient not taking: Reported on 06/29/2021 03/08/21   Derek Jack, MD  ?metFORMIN (GLUCOPHAGE) 500 MG tablet Take 1 tablet (500 mg total) by mouth 2 (two) times daily with a meal. ?Patient not taking: Reported on 06/29/2021 02/22/19   Maryruth Hancock, MD  ?potassium chloride SA (KLOR-CON M) 20 MEQ tablet TAKE 2 TABLETS BY MOUTH EVERY  3 HOURS TODAY, THEN TAKE 2 TABLETS ONCE DAILY THEREAFTER ?Patient not taking: Reported on 06/29/2021 06/14/21   Derek Jack, MD  ?scopolamine (TRANSDERM-SCOP) 1 MG/3DAYS Place 1 patch (1.5 mg total) onto the skin every 3 (three) days. ?Patient not taking: Reported on 06/29/2021 03/08/21   Derek Jack, MD  ? ? ?Physical Exam: ?Vitals:  ? 06/29/21 2100 06/29/21 2200 06/29/21 2230 06/30/21 0026  ?BP: 133/75 129/77 118/73 (!) 151/86  ?Pulse: 76 79 77 93  ?Resp: '18 17 17 19  '$ ?Temp:    98.5 ?F (36.9 ?C)  ?TempSrc:    Oral  ?SpO2: 100% 100% 100% 100%  ?Weight:    64.6 kg  ?Height:    '5\' 5"'$  (1.651 m)  ? ?1.  General: ?Patient lying supine in bed, chronically ill-appearing ?  ?2. Psychiatric: ?Alert and oriented x 3, flat affect and normal behavior, cooperative with exam ? ?3. Neurologic: ?Speech and language are  normal, face is symmetric, moves all 4 extremities voluntarily, sensation intact and equal in the bilateral upper and lower extremities, patient is generally weak, she struggles to lift her head off of the pillow, grip strength is reduced, she struggles with lifting her legs off the bed ? ?4. HEENMT:  ?Head is atraumatic, normocephalic, pupils reactive to light, neck is supple, trachea is midline, mucous membranes are moist ?  ?5. Respiratory : ?Lungs are clear to auscultation bilaterally without wheezing, rhonchi, rales, no cyanosis, no increase in work of breathing or accessory muscle use ?  ?6. Cardiovascular : ?Heart rate normal, rhythm is regular, no murmurs, rubs or gallops, no peripheral edema, peripheral pulses palpated ?  ?7. Gastrointestinal:  ?Abdomen is soft, nondistended, nontender to palpation bowel sounds active, no masses or organomegaly palpated ?  ?8. Skin:  ?Patient has hyperpigmentation of her distal fingers that has been going on since she started chemo per her report ?  ?9.Musculoskeletal:  ?No acute deformities or trauma, no asymmetry in tone, no peripheral edema, peripheral  pulses palpated, no tenderness to palpation in the extremities ? ?Data Reviewed: ?In the ED ?Temp 98.1, heart rate 78-105, respiratory rate 16-20, blood pressure 131/89-144/88, satting at 100% ?White blood cell co

## 2021-06-30 NOTE — Progress Notes (Signed)
ASSUMPTION OF CARE NOTE  ? ?06/30/2021 ?4:03 PM ? ?Hannah Knight was seen and examined.  The H&P by the admitting provider, orders, imaging was reviewed.  Please see new orders.  Will continue to follow.  ? ?Vitals:  ? 06/30/21 0449 06/30/21 1407  ?BP: 118/76 124/82  ?Pulse: 83 81  ?Resp: 18   ?Temp: 98.2 ?F (36.8 ?C) (!) 97.4 ?F (36.3 ?C)  ?SpO2: 100% 100%  ? ? ?Results for orders placed or performed during the hospital encounter of 06/29/21  ?Blood Culture (routine x 2)  ? Specimen: Left Antecubital; Blood  ?Result Value Ref Range  ? Specimen Description    ?  LEFT ANTECUBITAL BOTTLES DRAWN AEROBIC AND ANAEROBIC  ? Special Requests    ?  Blood Culture adequate volume ?Performed at [facility], [address], [place], [place] [address] ?  ? Culture PENDING   ? Report Status PENDING   ?Blood Culture (routine x 2)  ? Specimen: Porta Cath; Blood  ?Result Value Ref Range  ? Specimen Description PORTA CATH BOTTLES DRAWN AEROBIC AND ANAEROBIC   ? Special Requests    ?  Blood Culture adequate volume ?Performed at [facility], [address] [place], [place] [address] ?  ? Culture PENDING   ? Report Status PENDING   ?Lactic acid, plasma  ?Result Value Ref Range  ? Lactic Acid, Venous 3.0 (HH) 0.5 - 1.9 mmol/L  ?Lactic acid, plasma  ?Result Value Ref Range  ? Lactic Acid, Venous 1.6 0.5 - 1.9 mmol/L  ?Comprehensive metabolic panel  ?Result Value Ref Range  ? Sodium 135 135 - 145 mmol/L  ? Potassium 3.6 3.5 - 5.1 mmol/L  ? Chloride 106 98 - 111 mmol/L  ? CO2 18 (L) 22 - 32 mmol/L  ? Glucose, Bld 204 (H) 70 - 99 mg/dL  ? BUN 23 8 - 23 mg/dL  ? Creatinine, Ser 1.35 (H) 0.44 - 1.00 mg/dL  ? Calcium 9.7 8.9 - 10.3 mg/dL  ? Total Protein 7.2 6.5 - 8.1 g/dL  ? Albumin 3.5 3.5 - 5.0 g/dL  ? AST 22 15 - 41 U/L  ? ALT 14 0 - 44 U/L  ? Alkaline Phosphatase 105 38 - 126 U/L  ? Total Bilirubin 0.6 0.3 - 1.2 mg/dL  ? GFR, Estimated 44 (L) >60 mL/min  ? Anion gap 11 5 - 15  ?CBC with Differential  ?Result Value Ref Range  ? WBC  5.7 4.0 - 10.5 K/uL  ? RBC 3.09 (L) 3.87 - 5.11 MIL/uL  ? Hemoglobin 9.1 (L) 12.0 - 15.0 g/dL  ? HCT 27.9 (L) 36.0 - 46.0 %  ? MCV 90.3 80.0 - 100.0 fL  ? MCH 29.4 26.0 - 34.0 pg  ? MCHC 32.6 30.0 - 36.0 g/dL  ? RDW 17.6 (H) 11.5 - 15.5 %  ? Platelets 203 150 - 400 K/uL  ? nRBC 0.0 0.0 - 0.2 %  ? Neutrophils Relative % 60 %  ? Neutro Abs 3.5 1.7 - 7.7 K/uL  ? Lymphocytes Relative 22 %  ? Lymphs Abs 1.3 0.7 - 4.0 K/uL  ? Monocytes Relative 15 %  ? Monocytes Absolute 0.9 0.1 - 1.0 K/uL  ? Eosinophils Relative 0 %  ? Eosinophils Absolute 0.0 0.0 - 0.5 K/uL  ? Basophils Relative 1 %  ? Basophils Absolute 0.0 0.0 - 0.1 K/uL  ? WBC Morphology MORPHOLOGY UNREMARKABLE   ? RBC Morphology MORPHOLOGY UNREMARKABLE   ? Smear Review MORPHOLOGY UNREMARKABLE   ?  Immature Granulocytes 2 %  ? Abs Immature Granulocytes 0.09 (H) 0.00 - 0.07 K/uL  ?Protime-INR  ?Result Value Ref Range  ? Prothrombin Time 24.2 (H) 11.4 - 15.2 seconds  ? INR 2.2 (H) 0.8 - 1.2  ?APTT  ?Result Value Ref Range  ? aPTT 30 24 - 36 seconds  ?Urinalysis, Routine w reflex microscopic  ?Result Value Ref Range  ? Color, Urine AMBER (A) YELLOW  ? APPearance HAZY (A) CLEAR  ? Specific Gravity, Urine 1.029 1.005 - 1.030  ? pH 5.0 5.0 - 8.0  ? Glucose, UA 50 (A) NEGATIVE mg/dL  ? Hgb urine dipstick NEGATIVE NEGATIVE  ? Bilirubin Urine NEGATIVE NEGATIVE  ? Ketones, ur NEGATIVE NEGATIVE mg/dL  ? Protein, ur 100 (A) NEGATIVE mg/dL  ? Nitrite NEGATIVE NEGATIVE  ? Leukocytes,Ua NEGATIVE NEGATIVE  ? RBC / HPF 0-5 0 - 5 RBC/hpf  ? WBC, UA 0-5 0 - 5 WBC/hpf  ? Bacteria, UA NONE SEEN NONE SEEN  ? Squamous Epithelial / LPF 0-5 0 - 5  ? Mucus PRESENT   ? Hyaline Casts, UA PRESENT   ?Magnesium  ?Result Value Ref Range  ? Magnesium 1.8 1.7 - 2.4 mg/dL  ?HIV Antibody (routine testing w rflx)  ?Result Value Ref Range  ? HIV Screen 4th Generation wRfx Non Reactive Non Reactive  ?Comprehensive metabolic panel  ?Result Value Ref Range  ? Sodium 136 135 - 145 mmol/L  ? Potassium 3.1 (L)  3.5 - 5.1 mmol/L  ? Chloride 112 (H) 98 - 111 mmol/L  ? CO2 19 (L) 22 - 32 mmol/L  ? Glucose, Bld 117 (H) 70 - 99 mg/dL  ? BUN 19 8 - 23 mg/dL  ? Creatinine, Ser 1.02 (H) 0.44 - 1.00 mg/dL  ? Calcium 8.6 (L) 8.9 - 10.3 mg/dL  ? Total Protein 5.5 (L) 6.5 - 8.1 g/dL  ? Albumin 2.7 (L) 3.5 - 5.0 g/dL  ? AST 13 (L) 15 - 41 U/L  ? ALT 11 0 - 44 U/L  ? Alkaline Phosphatase 83 38 - 126 U/L  ? Total Bilirubin 0.5 0.3 - 1.2 mg/dL  ? GFR, Estimated >60 >60 mL/min  ? Anion gap 5 5 - 15  ?Magnesium  ?Result Value Ref Range  ? Magnesium 1.6 (L) 1.7 - 2.4 mg/dL  ?CBC with Differential/Platelet  ?Result Value Ref Range  ? WBC 6.9 4.0 - 10.5 K/uL  ? RBC 2.58 (L) 3.87 - 5.11 MIL/uL  ? Hemoglobin 7.6 (L) 12.0 - 15.0 g/dL  ? HCT 23.4 (L) 36.0 - 46.0 %  ? MCV 90.7 80.0 - 100.0 fL  ? MCH 29.5 26.0 - 34.0 pg  ? MCHC 32.5 30.0 - 36.0 g/dL  ? RDW 17.7 (H) 11.5 - 15.5 %  ? Platelets 173 150 - 400 K/uL  ? nRBC 0.0 0.0 - 0.2 %  ? Neutrophils Relative % 61 %  ? Neutro Abs 4.2 1.7 - 7.7 K/uL  ? Lymphocytes Relative 23 %  ? Lymphs Abs 1.6 0.7 - 4.0 K/uL  ? Monocytes Relative 14 %  ? Monocytes Absolute 0.9 0.1 - 1.0 K/uL  ? Eosinophils Relative 0 %  ? Eosinophils Absolute 0.0 0.0 - 0.5 K/uL  ? Basophils Relative 0 %  ? Basophils Absolute 0.0 0.0 - 0.1 K/uL  ? RBC Morphology MORPHOLOGY UNREMARKABLE   ? Smear Review MORPHOLOGY UNREMARKABLE   ? Immature Granulocytes 2 %  ? Abs Immature Granulocytes 0.14 (H) 0.00 - 0.07 K/uL  ?TSH  ?Result Value  Ref Range  ? TSH 4.538 (H) 0.350 - 4.500 uIU/mL  ?Hemoglobin A1c  ?Result Value Ref Range  ? Hgb A1c MFr Bld 6.5 (H) 4.8 - 5.6 %  ? Mean Plasma Glucose 139.85 mg/dL  ?Glucose, capillary  ?Result Value Ref Range  ? Glucose-Capillary 101 (H) 70 - 99 mg/dL  ?Vitamin B12  ?Result Value Ref Range  ? Vitamin B-12 3,156 (H) 180 - 914 pg/mL  ?Folate  ?Result Value Ref Range  ? Folate >40.0 >5.9 ng/mL  ?Iron and TIBC  ?Result Value Ref Range  ? Iron 20 (L) 28 - 170 ug/dL  ? TIBC 207 (L) 250 - 450 ug/dL  ? Saturation  Ratios 10 (L) 10.4 - 31.8 %  ? UIBC 187 ug/dL  ?Ferritin  ?Result Value Ref Range  ? Ferritin 2,540 (H) 11 - 307 ng/mL  ?Reticulocytes  ?Result Value Ref Range  ? Retic Ct Pct 1.9 0.4 - 3.1 %  ? RBC. 2.55 (L) 3.87 - 5.11 MIL/uL  ? Retic Count, Absolute 47.4 19.0 - 186.0 K/uL  ? Immature Retic Fract 30.8 (H) 2.3 - 15.9 %  ?Glucose, capillary  ?Result Value Ref Range  ? Glucose-Capillary 102 (H) 70 - 99 mg/dL  ?Troponin I (High Sensitivity)  ?Result Value Ref Range  ? Troponin I (High Sensitivity) 14 <18 ng/L  ? ?C. Wynetta Emery, MD ?[facility] ? ? 06/29/2021  5:09 PM ?How to contact the [facility] Attending or Consulting provider [place] or covering provider during after hours [place], for this patient?  ?Check the care team in [facility] and look for a) attending/consulting [facility] provider listed and b) the [facility] team listed ?Log into [email or web address] and use [place] universal password to access. If you do not have the password, please contact the hospital operator. ?Locate the [facility] provider you are looking for under [facility] and page to a number that you can be directly reached. ?If you still have difficulty reaching the provider, please page the [facility] (Director on Call) for the Hospitalists listed on amion for assistance. ? ?

## 2021-06-30 NOTE — Assessment & Plan Note (Addendum)
-  No longer on losartan at home ?-metoprolol 12.5 mg BID added ?

## 2021-06-30 NOTE — Assessment & Plan Note (Signed)
-   secondary to chemo? ?-Zofran q6HPRN for nausea ?-Compazine q 6H PRN for refractory nausea ?-Continue IV fluids ?-Continue to monitor ?

## 2021-06-30 NOTE — Assessment & Plan Note (Addendum)
--  free T4 is high at 1.94 ?--reduced levothyroxine to 100 mcg daily ?

## 2021-07-01 DIAGNOSIS — Z6823 Body mass index (BMI) 23.0-23.9, adult: Secondary | ICD-10-CM | POA: Diagnosis not present

## 2021-07-01 DIAGNOSIS — T451X5A Adverse effect of antineoplastic and immunosuppressive drugs, initial encounter: Secondary | ICD-10-CM | POA: Diagnosis present

## 2021-07-01 DIAGNOSIS — K224 Dyskinesia of esophagus: Secondary | ICD-10-CM | POA: Diagnosis present

## 2021-07-01 DIAGNOSIS — I10 Essential (primary) hypertension: Secondary | ICD-10-CM | POA: Diagnosis present

## 2021-07-01 DIAGNOSIS — E860 Dehydration: Secondary | ICD-10-CM | POA: Diagnosis present

## 2021-07-01 DIAGNOSIS — R531 Weakness: Secondary | ICD-10-CM | POA: Diagnosis present

## 2021-07-01 DIAGNOSIS — L89152 Pressure ulcer of sacral region, stage 2: Secondary | ICD-10-CM | POA: Diagnosis present

## 2021-07-01 DIAGNOSIS — B9620 Unspecified Escherichia coli [E. coli] as the cause of diseases classified elsewhere: Secondary | ICD-10-CM | POA: Diagnosis present

## 2021-07-01 DIAGNOSIS — N390 Urinary tract infection, site not specified: Secondary | ICD-10-CM | POA: Diagnosis present

## 2021-07-01 DIAGNOSIS — E0500 Thyrotoxicosis with diffuse goiter without thyrotoxic crisis or storm: Secondary | ICD-10-CM | POA: Diagnosis present

## 2021-07-01 DIAGNOSIS — Z7401 Bed confinement status: Secondary | ICD-10-CM | POA: Diagnosis not present

## 2021-07-01 DIAGNOSIS — K769 Liver disease, unspecified: Secondary | ICD-10-CM | POA: Diagnosis present

## 2021-07-01 DIAGNOSIS — E039 Hypothyroidism, unspecified: Secondary | ICD-10-CM

## 2021-07-01 DIAGNOSIS — D649 Anemia, unspecified: Secondary | ICD-10-CM | POA: Diagnosis present

## 2021-07-01 DIAGNOSIS — C169 Malignant neoplasm of stomach, unspecified: Secondary | ICD-10-CM

## 2021-07-01 DIAGNOSIS — E119 Type 2 diabetes mellitus without complications: Secondary | ICD-10-CM | POA: Diagnosis present

## 2021-07-01 DIAGNOSIS — Z66 Do not resuscitate: Secondary | ICD-10-CM | POA: Diagnosis present

## 2021-07-01 DIAGNOSIS — D6481 Anemia due to antineoplastic chemotherapy: Secondary | ICD-10-CM | POA: Diagnosis present

## 2021-07-01 DIAGNOSIS — Z86010 Personal history of colonic polyps: Secondary | ICD-10-CM | POA: Diagnosis not present

## 2021-07-01 DIAGNOSIS — Z800 Family history of malignant neoplasm of digestive organs: Secondary | ICD-10-CM | POA: Diagnosis not present

## 2021-07-01 DIAGNOSIS — I2699 Other pulmonary embolism without acute cor pulmonale: Secondary | ICD-10-CM | POA: Diagnosis present

## 2021-07-01 DIAGNOSIS — R112 Nausea with vomiting, unspecified: Secondary | ICD-10-CM

## 2021-07-01 DIAGNOSIS — C160 Malignant neoplasm of cardia: Secondary | ICD-10-CM | POA: Diagnosis not present

## 2021-07-01 DIAGNOSIS — Z9049 Acquired absence of other specified parts of digestive tract: Secondary | ICD-10-CM | POA: Diagnosis not present

## 2021-07-01 DIAGNOSIS — E440 Moderate protein-calorie malnutrition: Secondary | ICD-10-CM | POA: Diagnosis present

## 2021-07-01 DIAGNOSIS — L8990 Pressure ulcer of unspecified site, unspecified stage: Secondary | ICD-10-CM | POA: Diagnosis present

## 2021-07-01 DIAGNOSIS — C799 Secondary malignant neoplasm of unspecified site: Secondary | ICD-10-CM | POA: Diagnosis present

## 2021-07-01 DIAGNOSIS — E876 Hypokalemia: Secondary | ICD-10-CM | POA: Diagnosis present

## 2021-07-01 LAB — CBC
HCT: 23.1 % — ABNORMAL LOW (ref 36.0–46.0)
Hemoglobin: 7.1 g/dL — ABNORMAL LOW (ref 12.0–15.0)
MCH: 28 pg (ref 26.0–34.0)
MCHC: 30.7 g/dL (ref 30.0–36.0)
MCV: 90.9 fL (ref 80.0–100.0)
Platelets: 207 10*3/uL (ref 150–400)
RBC: 2.54 MIL/uL — ABNORMAL LOW (ref 3.87–5.11)
RDW: 17.8 % — ABNORMAL HIGH (ref 11.5–15.5)
WBC: 11.3 10*3/uL — ABNORMAL HIGH (ref 4.0–10.5)
nRBC: 0.2 % (ref 0.0–0.2)

## 2021-07-01 LAB — BASIC METABOLIC PANEL
Anion gap: 4 — ABNORMAL LOW (ref 5–15)
BUN: 17 mg/dL (ref 8–23)
CO2: 17 mmol/L — ABNORMAL LOW (ref 22–32)
Calcium: 8.4 mg/dL — ABNORMAL LOW (ref 8.9–10.3)
Chloride: 115 mmol/L — ABNORMAL HIGH (ref 98–111)
Creatinine, Ser: 1 mg/dL (ref 0.44–1.00)
GFR, Estimated: >60 mL/min (ref >60)
Glucose, Bld: 102 mg/dL — ABNORMAL HIGH (ref 70–99)
Potassium: 3.6 mmol/L (ref 3.5–5.1)
Sodium: 136 mmol/L (ref 135–145)

## 2021-07-01 LAB — PREPARE RBC (CROSSMATCH)

## 2021-07-01 LAB — HEMOGLOBIN AND HEMATOCRIT, BLOOD
HCT: 33.2 % — ABNORMAL LOW (ref 36.0–46.0)
Hemoglobin: 11.2 g/dL — ABNORMAL LOW (ref 12.0–15.0)

## 2021-07-01 LAB — ABO/RH: ABO/RH(D): A POS

## 2021-07-01 LAB — GLUCOSE, CAPILLARY
Glucose-Capillary: 100 mg/dL — ABNORMAL HIGH (ref 70–99)
Glucose-Capillary: 90 mg/dL (ref 70–99)
Glucose-Capillary: 112 mg/dL — ABNORMAL HIGH (ref 70–99)
Glucose-Capillary: 119 mg/dL — ABNORMAL HIGH (ref 70–99)

## 2021-07-01 LAB — MAGNESIUM: Magnesium: 2.3 mg/dL (ref 1.7–2.4)

## 2021-07-01 MED ORDER — METOPROLOL TARTRATE 25 MG PO TABS
12.5000 mg | ORAL_TABLET | Freq: Two times a day (BID) | ORAL | Status: DC
  Administered 2021-07-01 – 2021-07-02 (×3): 12.5 mg via ORAL
  Filled 2021-07-01 (×3): qty 1

## 2021-07-01 MED ORDER — LEVOTHYROXINE SODIUM 100 MCG PO TABS
100.0000 ug | ORAL_TABLET | Freq: Every day | ORAL | Status: DC
  Administered 2021-07-02 – 2021-07-04 (×3): 100 ug via ORAL
  Filled 2021-07-01 (×3): qty 1

## 2021-07-01 MED ORDER — POTASSIUM CHLORIDE 10 MEQ/100ML IV SOLN
INTRAVENOUS | Status: AC
  Filled 2021-07-01: qty 100

## 2021-07-01 MED ORDER — POTASSIUM CHLORIDE 10 MEQ/100ML IV SOLN
10.0000 meq | INTRAVENOUS | Status: AC
  Administered 2021-07-01 (×3): 10 meq via INTRAVENOUS

## 2021-07-01 MED ORDER — SODIUM CHLORIDE 0.9% IV SOLUTION: Freq: Once | INTRAVENOUS | Status: AC

## 2021-07-01 MED ORDER — CHLORHEXIDINE GLUCONATE CLOTH 2 % EX PADS
6.0000 | MEDICATED_PAD | Freq: Every day | CUTANEOUS | Status: DC
  Administered 2021-07-01 – 2021-07-04 (×4): 6 via TOPICAL

## 2021-07-01 NOTE — Evaluation (Signed)
Physical Therapy Evaluation ?Patient Details ?Name: Hannah Knight ?MRN: [id number] ?DOB: 1956-02-28 ?Today's Date: 07/01/2021 ? ?History of Present Illness ? Hannah Knight is a 66 y.o. female with medical history significant of with history of diabetes mellitus type 2, Graves' disease, hypertension, and more presents to the ED with a chief complaint of generalized weakness.  Patient reports that started 2 days ago.  Has been progressively worse since it started.  Now she cannot walk.  She reports that the weakness is not asymmetric.  At baseline she is able to walk without any assistive device.  She lives with her sister who is able to help her, but she is mostly independent.  She reports a cramping pain in her bilateral upper extremities.  She reports that this has been going on since she has been on chemo, but it seems worse for the last couple of days.  Patient also reports dysphagia as its been going on since November.  She can only swallow liquids and soft foods.  She reports she been taking boost and Ensure at home.  Patient denies any fevers, cough, nausea/vomiting, abdominal pain, dysuria.  The generalized weakness is the specific complaint.     Patient is currently undergoing treatment for gastric adenocarcinoma.  Her last chemo was a week before last, her next treatment is May 11.  Patient follows with Dr. Delton Coombes who has been consulted.     Patient reports she does not smoke, does not drink, does not use illicit drugs.  She is vaccinated for COVID.  Patient is DNR. ? ?  ?Clinical Impression ? On therapist arrival, patient is lying in bed; agreeable to physical therapy evaluation. Patient needs extra time and moderate assistance for supine to sit and to come fully to the edge of bed.  Once sitting, patient tends to lean backwards and needs mod A to maintain balance; after a few moments can sit on Edge of bed with minimal assistance.  Patient agreeable to try to stand up so she performs  sit to stand with moderate assistance holding to therapist and hospital bed railing; once standing she can stand with minimal assistance.  She needs moderate assistance to return safely to sitting and from sitting to supine.   Patient with flat effect; but answers questions appropriately and follows commands. Patient will benefit from continued skilled therapy services during her hospital stay and at the next recommended venue of care to address deficits and promote optimal functional mobility.  ?   ? ?Recommendations for follow up therapy are one component of a multi-disciplinary discharge planning process, led by the attending physician.  Recommendations may be updated based on patient status, additional functional criteria and insurance authorization. ? ?Follow Up Recommendations Skilled nursing-short term rehab (<3 hours/day) ? ?  ?Assistance Recommended at Discharge Frequent or constant Supervision/Assistance  ?Patient can return home with the following ? A lot of help with walking and/or transfers;A lot of help with bathing/dressing/bathroom;Help with stairs or ramp for entrance ? ?  ?Equipment Recommendations None recommended by PT  ?Recommendations for Other Services ? Speech consult  ?  ?Functional Status Assessment Patient has had a recent decline in their functional status and demonstrates the ability to make significant improvements in function in a reasonable and predictable amount of time.  ? ?  ?Precautions / Restrictions Precautions ?Precautions: Fall ?Restrictions ?Weight Bearing Restrictions: No  ? ?  ? ?Mobility ? Bed Mobility ?Overal bed mobility: Needs Assistance ?Bed Mobility: Supine to Sit, Sit to Supine ?  ?  ?  Supine to sit: Mod assist ?Sit to supine: Mod assist ?  ?General bed mobility comments: needs A with legs;once sitting needs min to mod A for sitting balance ?Patient Response: Flat affect ? ?Transfers ?Overall transfer level: Needs assistance ?Equipment used: 1 person hand held  assist ?Transfers: Sit to/from Stand ?Sit to Stand: Mod assist ?  ?  ?  ?  ?  ?General transfer comment: leans backward ?  ? ?Ambulation/Gait ?  ?  ?  ?  ?  ?  ?  ?  ? ?Stairs ?  ?  ?  ?  ?  ? ?Wheelchair Mobility ?  ? ?Modified Rankin (Stroke Patients Only) ?  ? ?  ? ?Balance Overall balance assessment: Needs assistance ?Sitting-balance support: Bilateral upper extremity supported, Feet supported ?Sitting balance-Leahy Scale: Fair ?Sitting balance - Comments: fair sitting balance on EOB; tends to lean backwards; needs min to mod A to maintain safe sitting balance. ?Postural control: Posterior lean ?Standing balance support: Bilateral upper extremity supported ?Standing balance-Leahy Scale: Fair ?Standing balance comment: fair standing balance holding to therapist and railing of bed ?  ?  ?  ?  ?  ?  ?  ?  ?  ?  ?  ?   ? ? ? ?Pertinent Vitals/Pain Pain Assessment ?Pain Assessment: 0-10 ?Pain Score: 8  ?Pain Location: upper legs bilaterally ?Pain Intervention(s): Limited activity within patient's tolerance, Monitored during session  ? ? ?Home Living Family/patient expects to be discharged to:: Private residence ?Living Arrangements: Other relatives ?Available Help at Discharge: Family ?Type of Home: House ?Home Access: Stairs to enter ?Entrance Stairs-Rails: None ?Entrance Stairs-Number of Steps: 2 ?  ?Home Layout: One level ?Home Equipment: Shower seat;Grab bars - tub/shower;Grab bars - toilet;BSC/3in1;Rolling Walker (2 wheels) ?   ?  ?Prior Function Prior Level of Function : Needs assist ?  ?  ?  ?  ?  ?  ?  ?  ?  ? ? ?Hand Dominance  ? Dominant Hand: Right ? ?  ?Extremity/Trunk Assessment  ? Upper Extremity Assessment ?Upper Extremity Assessment: Generalized weakness ?  ? ?  ?  ? ?Cervical / Trunk Assessment ?Cervical / Trunk Assessment: Normal  ?Communication  ? Communication: No difficulties;Other (comment) (flat effect)  ?Cognition Arousal/Alertness: Awake/alert ?Behavior During Therapy: Flat affect ?Overall  Cognitive Status: Within Functional Limits for tasks assessed ?  ?  ?  ?  ?  ?  ?  ?  ?  ?  ?  ?  ?  ?  ?  ?  ?General Comments: answers questions appropriately; able to follow commands ?  ?  ? ?  ?General Comments   ? ?  ?Exercises    ? ?Assessment/Plan  ?  ?PT Assessment Patient needs continued PT services  ?PT Problem List Decreased strength;Decreased range of motion;Decreased activity tolerance;Decreased balance;Decreased mobility;Pain ? ?   ?  ?PT Treatment Interventions DME instruction;Balance training;Gait training;Neuromuscular re-education;Functional mobility training;Patient/family education;Therapeutic activities;Therapeutic exercise   ? ?PT Goals (Current goals can be found in the Care Plan section)  ?Acute Rehab PT Goals ?Patient Stated Goal: return home ?PT Goal Formulation: With patient ?Time For Goal Achievement: 07/15/21 ?Potential to Achieve Goals: Fair ? ?  ?Frequency Min 2X/week ?  ? ? ?Co-evaluation   ?  ?  ?  ?  ? ? ?  ?AM-PAC PT "6 Clicks" Mobility  ?Outcome Measure Help needed turning from your back to your side while in a flat bed without using bedrails?: A Lot ?Help  needed moving from lying on your back to sitting on the side of a flat bed without using bedrails?: A Lot ?Help needed moving to and from a bed to a chair (including a wheelchair)?: A Lot ?Help needed standing up from a chair using your arms (e.g., wheelchair or bedside chair)?: A Lot ?Help needed to walk in hospital room?: Total ?Help needed climbing 3-5 steps with a railing? : Total ?6 Click Score: 10 ? ?  ?End of Session Equipment Utilized During Treatment: Gait belt ?Activity Tolerance: Patient limited by pain ?Patient left: in bed;with call bell/phone within reach ?Nurse Communication: Mobility status ?PT Visit Diagnosis: Unsteadiness on feet (R26.81);Other abnormalities of gait and mobility (R26.89);Muscle weakness (generalized) (M62.81) ?  ? ?Time: 0752-0810 ?PT Time Calculation (min) (ACUTE ONLY): 18 min ? ? ?Charges:    PT Evaluation ?$PT Eval Low Complexity: 1 Low ?PT Treatments ?$Therapeutic Activity: 8-22 mins ?  ?   ? ? ?9:31 AM, 07/01/21 ?Cala Kruckenberg Small Kazi Reppond MPT ?[facility] ?[place] [phone] ?Ph:[phone] ? ?

## 2021-07-01 NOTE — Plan of Care (Signed)
?  Problem: Acute Rehab PT Goals(only PT should resolve) ?Goal: Pt Will Go Supine/Side To Sit ?Outcome: Progressing ?Flowsheets (Taken 07/01/2021 0931) ?Pt will go Supine/Side to Sit: with minimal assist ?Goal: Patient Will Transfer Sit To/From Stand ?Outcome: Progressing ?Flowsheets (Taken 07/01/2021 0931) ?Patient will transfer sit to/from stand: with minimal assist ?Goal: Pt Will Transfer Bed To Chair/Chair To Bed ?Outcome: Progressing ?Flowsheets (Taken 07/01/2021 0931) ?Pt will Transfer Bed to Chair/Chair to Bed: with min assist ?Goal: Pt Will Ambulate ?Outcome: Progressing ?Flowsheets (Taken 07/01/2021 0931) ?Pt will Ambulate: ? with rolling walker ? 10 feet ? with minimal assist ?  ?

## 2021-07-01 NOTE — Progress Notes (Signed)
?PROGRESS NOTE ? ? ?Hannah Knight  MWU:[id number] DOB: 1955-09-23 DOA: 06/29/2021 ?PCP: Celene Squibb, MD  ? ?Chief Complaint  ?Patient presents with  ? Weakness  ? ?Level of care: Telemetry ? ?Brief Admission History:  ?66 y.o. female with medical history significant of with history of diabetes mellitus type 2, Graves' disease, hypertension, and more presents to the ED with a chief complaint of generalized weakness.  Patient reports that started 2 days ago.  Has been progressively worse since it started.  Now she cannot walk.  She reports that the weakness is not asymmetric.  At baseline she is able to walk without any assistive device.  She lives with her sister who is able to help her, but she is mostly independent.  She reports a cramping pain in her bilateral upper extremities.  She reports that this has been going on since she has been on chemo, but it seems worse for the last couple of days.  Patient also reports dysphagia as its been going on since November.  She can only swallow liquids and soft foods.  She reports she been taking boost and Ensure at home.  Patient denies any fevers, cough, nausea/vomiting, abdominal pain, dysuria.  The generalized weakness is the specific complaint. ?  ?Patient is currently undergoing treatment for gastric adenocarcinoma.  Her last chemo was a week before last, her next treatment is May 11.  Patient follows with Dr. Delton Coombes who has been consulted. ?  ?Patient reports she does not smoke, does not drink, does not use illicit drugs.  She is vaccinated for COVID.  Patient is DNR. ?  ?Assessment and Plan: ?* Generalized weakness ?-2/2 chemo, deconditioning, poor PO intake and anemia  ?--PT eval and treat ?-Continue to monitor ?-transfusing 2 unit PRBC on 4/30  ? ? ?Symptomatic anemia ?-- Hg down to 7.1 from 9 recently tested ?-- no signs of bleeding found ?-- suspect from recent chemotherapy ?-- transfuse 2 units PRBC ?-- recheck CBC in AM  ? ?Pressure injury of  skin ?Pressure Injury 06/30/21 Sacrum Stage 2 -  Partial thickness loss of dermis presenting as a shallow open injury with a red, pink wound bed without slough. (Active)  ?06/30/21 2000  ?Location: Sacrum  ?Location Orientation:   ?Staging: Stage 2 -  Partial thickness loss of dermis presenting as a shallow open injury with a red, pink wound bed without slough.  ?Wound Description (Comments):   ?Present on Admission: Yes  ? ? ?--continue skin care protocol  ? ?Pulmonary embolus (HCC) ?-- continue apixaban for full anticoagulation ? ?Gastric cancer ([place]) ?Last chemo week before last ?Next chemo scheduled for May 11th ?Follow up with oncology after discharge ? ? ? ?Hypothyroidism ?--free T4 is high at 1.94 ?--reduced levothyroxine to 100 mcg daily ? ?Essential hypertension, benign ?-No longer on losartan at home ?-metoprolol 12.5 mg BID added ? ?Diabetes mellitus without complication ([place]) ?-Hold metformin ?-sliding scale coverage ?-Continue to monitor ?CBG (last 3)  ?Recent Labs  ?  06/30/21 ?2118 07/01/21 ?0714 07/01/21 ?1107  ?GLUCAP 125* 100* 90  ? ? ? ?Intractable nausea and vomiting-resolved as of 06/30/2021 ?- secondary to chemo? ?-Zofran q6HPRN for nausea ?-Compazine q 6H PRN for refractory nausea ?-Continue IV fluids ?-Continue to monitor ? ?DVT prophylaxis: apixaban ?Code Status: DNR  ?Family Communication:  ?Disposition: anticipating SNF  ?  ?Consultants:  ?PT ?Procedures:  ? ?Antimicrobials:  ?  ?Subjective: ?Pt reports that she feels very tired still after the fluids.  She is  agreeable to blood transfusion.   ?Objective: ?Vitals:  ? 06/30/21 1954 07/01/21 0432 07/01/21 1153 07/01/21 1209  ?BP: (!) 150/89 140/76 (!) 144/74 (!) 148/80  ?Pulse: 82 96 97 (!) 101  ?Resp:  '19 16 16  '$ ?Temp: 98.7 ?F (37.1 ?C) 98 ?F (36.7 ?C) 98.5 ?F (36.9 ?C) 98.5 ?F (36.9 ?C)  ?TempSrc: Oral  Oral   ?SpO2: 100% 100% 100%   ?Weight:      ?Height:      ? ? ?Intake/Output Summary (Last 24 hours) at 07/01/2021 1237 ?Last data filed at  07/01/2021 0500 ?Gross per 24 hour  ?Intake 2983.79 ml  ?Output 651 ml  ?Net 2332.79 ml  ? ?Filed Weights  ? 06/29/21 1721 06/30/21 0026  ?Weight: 64.5 kg 64.6 kg  ? ?Examination: ? ?General exam: awake, alert, cooperative, appears chronically ill, lying supine in bed, appears frail and weak.   ?Respiratory system: Clear to auscultation. Respiratory effort normal. ?Cardiovascular system: normal S1 & S2 heard. No JVD, murmurs, rubs, gallops or clicks. No pedal edema. ?Gastrointestinal system: Abdomen is nondistended, soft. No organomegaly or masses felt. Normal bowel sounds heard. ?Central nervous system: Alert and oriented. No focal neurological deficits. ?Extremities: Symmetric 5 x 5 power. ?Skin: No rashes, lesions or ulcers. ?Psychiatry: Judgement and insight appear normal. Mood & affect flat.   ? ?Data Reviewed: I have personally reviewed following labs and imaging studies ? ?CBC: ?Recent Labs  ?Lab 06/29/21 ?1734 06/30/21 ?0442 07/01/21 ?0422  ?WBC 5.7 6.9 11.3*  ?NEUTROABS 3.5 4.2  --   ?HGB 9.1* 7.6* 7.1*  ?HCT 27.9* 23.4* 23.1*  ?MCV 90.3 90.7 90.9  ?PLT 203 173 207  ? ? ?Basic Metabolic Panel: ?Recent Labs  ?Lab 06/29/21 ?1734 06/30/21 ?0442 07/01/21 ?0422  ?NA 135 136 136  ?K 3.6 3.1* 3.6  ?CL 106 112* 115*  ?CO2 18* 19* 17*  ?GLUCOSE 204* 117* 102*  ?BUN '23 19 17  '$ ?CREATININE 1.35* 1.02* 1.00  ?CALCIUM 9.7 8.6* 8.4*  ?MG 1.8 1.6* 2.3  ? ? ?CBG: ?Recent Labs  ?Lab 06/30/21 ?1106 06/30/21 ?1611 06/30/21 ?2118 07/01/21 ?3810 07/01/21 ?1107  ?GLUCAP 102* 85 125* 100* 90  ? ? ?Recent Results (from the past 240 hour(s))  ?Urine Culture     Status: Abnormal (Preliminary result)  ? Collection Time: 06/29/21  5:26 PM  ? Specimen: In/Out Cath Urine  ?Result Value Ref Range Status  ? Specimen Description   Final  ?  IN/OUT CATH URINE ?Performed at [facility], [address] [place], [place] [address] ?  ? Special Requests   Final  ?  NONE ?Performed at [facility], [address] [place], [place] [address] ?  ?  Culture (A)  Final  ?  10,000 COLONIES/mL ESCHERICHIA COLI ?SUSCEPTIBILITIES TO FOLLOW ?Performed at [facility], [place] [address], [place], [place] [address] ?  ? Report Status PENDING  Incomplete  ?Blood Culture (routine x 2)     Status: None (Preliminary result)  ? Collection Time: 06/29/21  5:35 PM  ? Specimen: Left Antecubital; Blood  ?Result Value Ref Range Status  ? Specimen Description   Final  ?  LEFT ANTECUBITAL BOTTLES DRAWN AEROBIC AND ANAEROBIC  ? Special Requests Blood Culture adequate volume  Final  ? Culture   Final  ?  NO GROWTH 2 DAYS ?Performed at [facility], [address], [place], [place] [address] ?  ? Report Status PENDING  Incomplete  ?Blood Culture (routine x 2)  Status: None (Preliminary result)  ? Collection Time: 06/29/21  6:43 PM  ? Specimen: Porta Cath; Blood  ?Result Value Ref Range Status  ? Specimen Description PORTA CATH BOTTLES DRAWN AEROBIC AND ANAEROBIC  Final  ? Special Requests Blood Culture adequate volume  Final  ? Culture   Final  ?  NO GROWTH 2 DAYS ?Performed at [facility], [address], [place], [place] [address] ?  ? Report Status PENDING  Incomplete  ?  ? ?Radiology Studies: ?CT ABDOMEN PELVIS W CONTRAST ? ?Result Date: 06/29/2021 ?CLINICAL DATA:  Acute abdominal pain. Weakness. History of gastric cancer, active chemotherapy. Technologist notes state reports history of cervical cancer EXAM: CT ABDOMEN AND PELVIS WITH CONTRAST TECHNIQUE: Multidetector CT imaging of the abdomen and pelvis was performed using the standard protocol following bolus administration of intravenous contrast. RADIATION DOSE REDUCTION: This exam was performed according to the departmental dose-optimization program which includes automated exposure control, adjustment of the mA and/or kV according to patient size and/or use of iterative reconstruction technique. CONTRAST:  159m OMNIPAQUE IOHEXOL 300 MG/ML  SOLN COMPARISON:  CT 06/12/2021 FINDINGS: Lower chest: Mild bibasilar  atelectasis.  No pleural fluid. Hepatobiliary: Stable subcapsular segment 5 hypodense lesion measuring 10 x 9 mm, series 2, image 31. No new or additional hepatic lesions. Cholecystectomy without biliary dilatation. P

## 2021-07-01 NOTE — Assessment & Plan Note (Signed)
Pressure Injury 06/30/21 Sacrum Stage 2 -  Partial thickness loss of dermis presenting as a shallow open injury with a red, pink wound bed without slough. (Active)  ?06/30/21 2000  ?Location: Sacrum  ?Location Orientation:   ?Staging: Stage 2 -  Partial thickness loss of dermis presenting as a shallow open injury with a red, pink wound bed without slough.  ?Wound Description (Comments):   ?Present on Admission: Yes  ? ? ?--continue skin care protocol  ?

## 2021-07-01 NOTE — Assessment & Plan Note (Addendum)
--   Hg down to 7.1 from 9 recently tested ?-- no signs of bleeding found ?-- suspect from recent chemotherapy ?-- transfuse 2 units PRBC ?-- recheck CBC with Hg improved to 9.1 ?-- follow ?

## 2021-07-02 DIAGNOSIS — R531 Weakness: Secondary | ICD-10-CM | POA: Diagnosis not present

## 2021-07-02 DIAGNOSIS — E119 Type 2 diabetes mellitus without complications: Secondary | ICD-10-CM | POA: Diagnosis not present

## 2021-07-02 DIAGNOSIS — E440 Moderate protein-calorie malnutrition: Secondary | ICD-10-CM | POA: Diagnosis present

## 2021-07-02 DIAGNOSIS — I10 Essential (primary) hypertension: Secondary | ICD-10-CM | POA: Diagnosis not present

## 2021-07-02 DIAGNOSIS — C169 Malignant neoplasm of stomach, unspecified: Secondary | ICD-10-CM | POA: Diagnosis not present

## 2021-07-02 DIAGNOSIS — E876 Hypokalemia: Secondary | ICD-10-CM | POA: Diagnosis present

## 2021-07-02 LAB — BASIC METABOLIC PANEL
Anion gap: 4 — ABNORMAL LOW (ref 5–15)
BUN: 15 mg/dL (ref 8–23)
CO2: 15 mmol/L — ABNORMAL LOW (ref 22–32)
Calcium: 7.5 mg/dL — ABNORMAL LOW (ref 8.9–10.3)
Chloride: 118 mmol/L — ABNORMAL HIGH (ref 98–111)
Creatinine, Ser: 0.8 mg/dL (ref 0.44–1.00)
GFR, Estimated: >60 mL/min (ref >60)
Glucose, Bld: 100 mg/dL — ABNORMAL HIGH (ref 70–99)
Potassium: 3.2 mmol/L — ABNORMAL LOW (ref 3.5–5.1)
Sodium: 137 mmol/L (ref 135–145)

## 2021-07-02 LAB — TYPE AND SCREEN
ABO/RH(D): A POS
Antibody Screen: NEGATIVE
Unit division: 0
Unit division: 0

## 2021-07-02 LAB — CBC
HCT: 28.7 % — ABNORMAL LOW (ref 36.0–46.0)
Hemoglobin: 9.5 g/dL — ABNORMAL LOW (ref 12.0–15.0)
MCH: 28.8 pg (ref 26.0–34.0)
MCHC: 33.1 g/dL (ref 30.0–36.0)
MCV: 87 fL (ref 80.0–100.0)
Platelets: 191 10*3/uL (ref 150–400)
RBC: 3.3 MIL/uL — ABNORMAL LOW (ref 3.87–5.11)
RDW: 18.5 % — ABNORMAL HIGH (ref 11.5–15.5)
WBC: 15.9 10*3/uL — ABNORMAL HIGH (ref 4.0–10.5)
nRBC: 0.2 % (ref 0.0–0.2)

## 2021-07-02 LAB — URINE CULTURE: Culture: 10000 — AB

## 2021-07-02 LAB — BPAM RBC
Blood Product Expiration Date: 202305222359
Blood Product Expiration Date: 202305232359
ISSUE DATE / TIME: 202304301147
ISSUE DATE / TIME: 202304301437
Unit Type and Rh: 6200
Unit Type and Rh: 6200

## 2021-07-02 LAB — GLUCOSE, CAPILLARY
Glucose-Capillary: 108 mg/dL — ABNORMAL HIGH (ref 70–99)
Glucose-Capillary: 128 mg/dL — ABNORMAL HIGH (ref 70–99)
Glucose-Capillary: 118 mg/dL — ABNORMAL HIGH (ref 70–99)
Glucose-Capillary: 145 mg/dL — ABNORMAL HIGH (ref 70–99)

## 2021-07-02 LAB — MAGNESIUM: Magnesium: 1.6 mg/dL — ABNORMAL LOW (ref 1.7–2.4)

## 2021-07-02 LAB — OCCULT BLOOD X 1 CARD TO LAB, STOOL: Fecal Occult Bld: NEGATIVE

## 2021-07-02 MED ORDER — MAGNESIUM SULFATE 4 GM/100ML IV SOLN
4.0000 g | Freq: Once | INTRAVENOUS | Status: AC
Start: 2021-07-02 — End: 2021-07-02
  Administered 2021-07-02: 4 g via INTRAVENOUS
  Filled 2021-07-02: qty 100

## 2021-07-02 MED ORDER — METOPROLOL TARTRATE 25 MG PO TABS
25.0000 mg | ORAL_TABLET | Freq: Two times a day (BID) | ORAL | Status: DC
Start: 2021-07-02 — End: 2021-07-05
  Administered 2021-07-02 – 2021-07-04 (×4): 25 mg via ORAL
  Filled 2021-07-02 (×4): qty 1

## 2021-07-02 MED ORDER — POTASSIUM CHLORIDE 10 MEQ/100ML IV SOLN
10.0000 meq | INTRAVENOUS | Status: AC
  Administered 2021-07-02 (×4): 10 meq via INTRAVENOUS
  Filled 2021-07-02 (×4): qty 100

## 2021-07-02 NOTE — Assessment & Plan Note (Signed)
--  IV replacement ordered, recheck in AM ?

## 2021-07-02 NOTE — Assessment & Plan Note (Signed)
--   IV replacement ordered and Magnesium was replaced as well.  ?-- recheck in AM  ?

## 2021-07-02 NOTE — Progress Notes (Signed)
Initial Nutrition Assessment ? ?DOCUMENTATION CODES:  ? ?Non-severe (moderate) malnutrition in context of chronic illness ? ?INTERVENTION:  ?Follow for results of ST evaluation ? ?Boost and Ensure Enlive alternating as desired  ? ?Greek yogurt and magic cup with meals ? ?NUTRITION DIAGNOSIS:  ? ?Moderate Malnutrition related to cancer and cancer related treatments, dysphagia, decreased appetite as evidenced by energy intake < 75% for > or equal to 1 month, mild muscle depletion, per patient/family report, percent weight loss. ? ? ?GOAL:  ?Patient will meet greater than or equal to 90% of their needs (if feasible given her stage IV gastric cancer) ? ? ?MONITOR:  ?Supplement acceptance, PO intake, Labs, Weight trends ? ?REASON FOR ASSESSMENT:  ? ?Malnutrition Screening Tool ?  ? ?ASSESSMENT: Patient is a 66 yo female with hx of DM2, Graves dz, Stage IV gastric cancer receiving chemotherapy q 14 days. Porta cath placed 02/21/21. Generalized weakness. Stage 2 sacrum on admission. ? ?Poor appetite for food. Complaining of swallow difficulty. ST consult pending. She is able to drink Ensure with meals daily. Encouraged her to drink with each meal if tolerating. She has already consumed 1 today and nursing has provided another during RD visit. Patient likes vanilla flavor. Food preferences obtained- jello, mashed potatoes with gravy, yogurt, ice cream, pudding. ? ?4/18-Weight loss noted per oncology.  4/28- wt 64.5 kg. A significant loss of 7% x 5 wks ?Wt Readings from Last 3 Encounters:  ?06/19/21 142 lb 3.2 oz (64.5 kg)  ?06/05/21 146 lb 12.8 oz (66.6 kg)  ?05/22/21 153 lb 8 oz (69.6 kg)  ? ?Medications: synthroid, insulin.  ? ?Labs: ? ?  Latest Ref Rng & Units 07/02/2021  ?  4:04 AM 07/01/2021  ?  4:22 AM 06/30/2021  ?  4:42 AM  ?BMP  ?Glucose 70 - 99 mg/dL 100   102   117    ?BUN 8 - 23 mg/dL '15   17   19    '$ ?Creatinine 0.44 - 1.00 mg/dL 0.80   1.00   1.02    ?Sodium 135 - 145 mmol/L 137   136   136    ?Potassium 3.5 - 5.1  mmol/L 3.2   3.6   3.1    ?Chloride 98 - 111 mmol/L 118   115   112    ?CO2 22 - 32 mmol/L '15   17   19    '$ ?Calcium 8.9 - 10.3 mg/dL 7.5   8.4   8.6    ?   ? ?NUTRITION - FOCUSED PHYSICAL EXAM: ? ?Flowsheet Row Most Recent Value  ?Orbital Region Mild depletion  ?Upper Arm Region No depletion  ?Thoracic and Lumbar Region No depletion  ?Buccal Region No depletion  ?Temple Region Mild depletion  ?Clavicle Bone Region No depletion  ?Clavicle and Acromion Bone Region Mild depletion  ?Dorsal Hand No depletion  ?Patellar Region Mild depletion  ?Anterior Thigh Region Mild depletion  ?Posterior Calf Region No depletion  ?Edema (RD Assessment) Mild  ?Hair Reviewed  ?Eyes Reviewed  ?Mouth Reviewed  ?Skin Reviewed  ?Nails Reviewed  ? ?  ? ?Diet Order:   ?Diet Order   ? ?       ?  DIET DYS 3 Room service appropriate? Yes; Fluid consistency: Thin  Diet effective now       ?  ? ?  ?  ? ?  ? ? ?EDUCATION NEEDS:  ?Education needs have been addressed ? ?Skin:  Skin Assessment: Reviewed RN Assessment (stage  2 to sacrum) ? ?Last BM:  4/28 ? ?Height:  ? ?Ht Readings from Last 1 Encounters:  ?06/30/21 '5\' 5"'$  (1.651 m)  ? ? ?Weight:  ? ?Wt Readings from Last 1 Encounters:  ?06/30/21 64.6 kg  ? ? ?Ideal Body Weight:   57 kg ? ?BMI:  Body mass index is 23.7 kg/m?. ? ?Estimated Nutritional Needs:  ? ?Kcal:  1950- 2200 ? ?Protein:  90-98 gr ? ?Fluid:  2.0-2.1 liters daily ? ?Colman Cater MS,RD,CSG,LDN ?Contact: [facility] ? ?

## 2021-07-02 NOTE — Progress Notes (Signed)
?PROGRESS NOTE ? ? ?Kylan Veach Knoche  UKG:[id number] DOB: [birth year] DOA: 06/29/2021 ?PCP: Celene Squibb, MD  ? ?Chief Complaint  ?Patient presents with  ? Weakness  ? ?Level of care: Med-Surg ? ?Brief Admission History:  ?66 y.o. female with medical history significant of with history of diabetes mellitus type 2, Graves' disease, hypertension, and more presents to the ED with a chief complaint of generalized weakness.  Patient reports that started 2 days ago.  Has been progressively worse since it started.  Now she cannot walk.  She reports that the weakness is not asymmetric.  At baseline she is able to walk without any assistive device.  She lives with her sister who is able to help her, but she is mostly independent.  She reports a cramping pain in her bilateral upper extremities.  She reports that this has been going on since she has been on chemo, but it seems worse for the last couple of days.  Patient also reports dysphagia as its been going on since November.  She can only swallow liquids and soft foods.  She reports she been taking boost and Ensure at home.  Patient denies any fevers, cough, nausea/vomiting, abdominal pain, dysuria.  The generalized weakness is the specific complaint. ?  ?Patient is currently undergoing treatment for gastric adenocarcinoma.  Her last chemo was a week before last, her next treatment is May 11.  Patient follows with Dr. Delton Coombes who has been consulted. ?  ?Patient reports she does not smoke, does not drink, does not use illicit drugs.  She is vaccinated for COVID.  Patient is DNR. ?  ?Assessment and Plan: ?* Generalized weakness ?-2/2 chemo, deconditioning, poor PO intake and anemia  ?--PT eval and treat recommending SNF.  TOC working on SNF placement.   ?-Continue to monitor ?-transfused 2 unit PRBC on 4/30  ? ? ?Hypokalemia ?-- IV replacement ordered and Magnesium was replaced as well.  ?-- recheck in AM  ? ?Hypomagnesemia ?-- IV replacement ordered, recheck in AM.   ? ?Symptomatic anemia ?-- Hg down to 7.1 from 9 recently tested ?-- no signs of bleeding found ?-- suspect from recent chemotherapy ?-- transfuse 2 units PRBC ?-- recheck CBC with Hg improved to 9.1 ?-- follow ? ?Pressure injury of skin ?Pressure Injury 06/30/21 Sacrum Stage 2 -  Partial thickness loss of dermis presenting as a shallow open injury with a red, pink wound bed without slough. (Active)  ?06/30/21 2000  ?Location: Sacrum  ?Location Orientation:   ?Staging: Stage 2 -  Partial thickness loss of dermis presenting as a shallow open injury with a red, pink wound bed without slough.  ?Wound Description (Comments):   ?Present on Admission: Yes  ? ? ?--continue skin care protocol  ? ?Pulmonary embolus (HCC) ?-- continue apixaban for full anticoagulation ?-- no evidence of GI bleeding at this time but following ? ?Gastric cancer ([place]) ?Last chemo week before last ?Next chemo scheduled for May 11th ?Follow up with oncology after discharge ? ? ? ?Hypothyroidism ?--free T4 is high at 1.94 ?--reduced levothyroxine to 100 mcg daily ? ?Essential hypertension, benign ?-No longer on losartan at home ?-metoprolol 12.5 mg BID added ? ?Diabetes mellitus without complication ([place]) ?-Hold metformin ?-sliding scale coverage ?-Continue to monitor ?CBG (last 3)  ?Recent Labs  ?  06/30/21 ?2118 07/01/21 ?0714 07/01/21 ?1107  ?GLUCAP 125* 100* 90  ? ? ? ?Intractable nausea and vomiting-resolved as of 06/30/2021 ?- secondary to chemo? ?-Zofran q6HPRN for nausea ?-Compazine q  6H PRN for refractory nausea ?-Continue IV fluids ?-Continue to monitor ? ?DVT prophylaxis: apixaban ?Code Status: DNR  ?Family Communication:  ?Disposition: anticipating SNF  ?  ?Consultants:  ?PT ?Procedures:  ? ?Antimicrobials:  ?  ?Subjective: ?Pt a little stronger after the PRBC transfusion.  She is agreeable to SNF rehabilitation.  ? ?Objective: ?Vitals:  ? 07/01/21 1458 07/01/21 1647 07/01/21 2107 07/02/21 0532  ?BP: 134/80 135/81 (!) 143/82 (!) 147/91   ?Pulse: 88 71 87 80  ?Resp: '16 16 16 16  '$ ?Temp: 98.2 ?F (36.8 ?C) 98.3 ?F (36.8 ?C) 99.1 ?F (37.3 ?C) 98.6 ?F (37 ?C)  ?TempSrc:  Oral Oral Oral  ?SpO2:   99% 99%  ?Weight:      ?Height:      ? ? ?Intake/Output Summary (Last 24 hours) at 07/02/2021 1324 ?Last data filed at 07/02/2021 0500 ?Gross per 24 hour  ?Intake 1665.3 ml  ?Output 800 ml  ?Net 865.3 ml  ? ?Filed Weights  ? 06/29/21 1721 06/30/21 0026  ?Weight: 64.5 kg 64.6 kg  ? ?Examination: ? ?General exam: awake, alert, cooperative, appears chronically ill, lying supine in bed, appears frail and weak.   ?Respiratory system: Clear to auscultation. Respiratory effort normal. ?Cardiovascular system: normal S1 & S2 heard. No JVD, murmurs, rubs, gallops or clicks. No pedal edema. ?Gastrointestinal system: Abdomen is nondistended, soft. No organomegaly or masses felt. Normal bowel sounds heard. ?Central nervous system: Alert and oriented. No focal neurological deficits. ?Extremities: Symmetric 5 x 5 power. ?Skin: No rashes, lesions or ulcers. ?Psychiatry: Judgement and insight appear normal. Mood & affect flat.   ? ?Data Reviewed: I have personally reviewed following labs and imaging studies ? ?CBC: ?Recent Labs  ?Lab 06/29/21 ?1734 06/30/21 ?3888 07/01/21 ?0422 07/01/21 ?1659 07/02/21 ?0404  ?WBC 5.7 6.9 11.3*  --  15.9*  ?NEUTROABS 3.5 4.2  --   --   --   ?HGB 9.1* 7.6* 7.1* 11.2* 9.5*  ?HCT 27.9* 23.4* 23.1* 33.2* 28.7*  ?MCV 90.3 90.7 90.9  --  87.0  ?PLT 203 173 207  --  191  ? ? ?Basic Metabolic Panel: ?Recent Labs  ?Lab 06/29/21 ?1734 06/30/21 ?2800 07/01/21 ?0422 07/02/21 ?0404  ?NA 135 136 136 137  ?K 3.6 3.1* 3.6 3.2*  ?CL 106 112* 115* 118*  ?CO2 18* 19* 17* 15*  ?GLUCOSE 204* 117* 102* 100*  ?BUN '23 19 17 15  '$ ?CREATININE 1.35* 1.02* 1.00 0.80  ?CALCIUM 9.7 8.6* 8.4* 7.5*  ?MG 1.8 1.6* 2.3 1.6*  ? ? ?CBG: ?Recent Labs  ?Lab 07/01/21 ?1107 07/01/21 ?1758 07/01/21 ?2104 07/02/21 ?3491 07/02/21 ?1127  ?GLUCAP 90 112* 119* 108* 128*  ? ? ?Recent Results (from  the past 240 hour(s))  ?Urine Culture     Status: Abnormal  ? Collection Time: 06/29/21  5:26 PM  ? Specimen: In/Out Cath Urine  ?Result Value Ref Range Status  ? Specimen Description   Final  ?  IN/OUT CATH URINE ?Performed at [facility], [address] [place], [place] [address] ?  ? Special Requests   Final  ?  NONE ?Performed at [facility], [address] [place], [place] [address] ?  ? Culture 10,000 COLONIES/mL ESCHERICHIA COLI (A)  Final  ? Report Status 07/02/2021 FINAL  Final  ? Organism ID, Bacteria ESCHERICHIA COLI (A)  Final  ?    Susceptibility  ? Escherichia coli - MIC*  ?  AMPICILLIN <=2 SENSITIVE Sensitive   ?  CEFAZOLIN <=4 SENSITIVE Sensitive   ?  CEFEPIME <=0.12 SENSITIVE Sensitive   ?  CEFTRIAXONE <=0.25 SENSITIVE Sensitive   ?  CIPROFLOXACIN <=0.25 SENSITIVE Sensitive   ?  GENTAMICIN <=1 SENSITIVE Sensitive   ?  IMIPENEM <=0.25 SENSITIVE Sensitive   ?  NITROFURANTOIN <=16 SENSITIVE Sensitive   ?  TRIMETH/SULFA <=20 SENSITIVE Sensitive   ?  AMPICILLIN/SULBACTAM <=2 SENSITIVE Sensitive   ?  PIP/TAZO <=4 SENSITIVE Sensitive   ?  * 10,000 COLONIES/mL ESCHERICHIA COLI  ?Blood Culture (routine x 2)     Status: None (Preliminary result)  ? Collection Time: 06/29/21  5:35 PM  ? Specimen: Left Antecubital; Blood  ?Result Value Ref Range Status  ? Specimen Description   Final  ?  LEFT ANTECUBITAL BOTTLES DRAWN AEROBIC AND ANAEROBIC  ? Special Requests Blood Culture adequate volume  Final  ? Culture   Final  ?  NO GROWTH 3 DAYS ?Performed at [facility], [address], [place], [place] [address] ?  ? Report Status PENDING  Incomplete  ?Blood Culture (routine x 2)     Status: None (Preliminary result)  ? Collection Time: 06/29/21  6:43 PM  ? Specimen: Porta Cath; Blood  ?Result Value Ref Range Status  ? Specimen Description PORTA CATH BOTTLES DRAWN AEROBIC AND ANAEROBIC  Final  ? Special Requests Blood Culture adequate volume  Final  ? Culture   Final  ?  NO GROWTH 3 DAYS ?Performed at [facility], [address] [place], [place] [address] ?  ? Report Status PENDING  Incomplete  ?  ? ?Radiology Studies: ?No results found. ? ?Scheduled Meds: ? apixaban  5 mg Oral BID  ? Chlorhexidine Gluconate Cloth  6

## 2021-07-02 NOTE — Evaluation (Signed)
Clinical/Bedside Swallow Evaluation ?Patient Details  ?Name: Hannah Knight ?MRN: [id number] ?Date of Birth: Mar 21, 1955 ? ?Today's Date: 07/02/2021 ?Time: SLP Start Time (ACUTE ONLY): 1245 SLP Stop Time (ACUTE ONLY): 8099 ?SLP Time Calculation (min) (ACUTE ONLY): 26 min ? ?Past Medical History:  ?Past Medical History:  ?Diagnosis Date  ? Allergy   ? Arthritis   ? Diabetes mellitus   ? Graves disease   ? with radiation, now on Synthroid  ? Graves disease   ? Hypertension   ? Port-A-Cath in place 03/08/2021  ? ?Past Surgical History:  ?Past Surgical History:  ?Procedure Laterality Date  ? BIOPSY  01/23/2021  ? Procedure: BIOPSY;  Surgeon: [name], DO;  [facility];  Service: Endoscopy;;  ? CHOLECYSTECTOMY    ? COLONOSCOPY  09/22/2007  ? SLF: moderate internal hemorrhoids, multiple 3-4 mm sessile polyps in splenic flexure, hyperplastic.   ? COLONOSCOPY  09/27/2002  ? Rehman:Small external hemorrhoids/Six tiny   polyps ablated by cold biopsy from sigmoid colon  ? COLONOSCOPY N/A 06/29/2013  ? Moderate sized internal hemorrhoids. Sigmoid diverticulosis. Normal TI. Banding X 3.  ? COLONOSCOPY WITH PROPOFOL N/A 01/23/2021  ? Procedure: COLONOSCOPY WITH PROPOFOL;  Surgeon: [name], DO;  [facility];  Service: Endoscopy;  Laterality: N/A;  8:00am  ? ESOPHAGOGASTRODUODENOSCOPY (EGD) WITH PROPOFOL N/A 01/23/2021  ? Procedure: ESOPHAGOGASTRODUODENOSCOPY (EGD) WITH PROPOFOL;  Surgeon: [name], DO;  [facility];  Service: Endoscopy;  Laterality: N/A;  ? HEMORRHOID BANDING  06/29/2013  ? Procedure: HEMORRHOID BANDING;  Surgeon: Danie Binder, MD;  [facility];  Service: Endoscopy;;  ? IR IMAGING GUIDED PORT INSERTION  02/21/2021  ? IR US GUIDE BX ASP/DRAIN  02/21/2021  ? TUBAL LIGATION    ? ?HPI:  ?66 y.o. female with medical history significant of with history of diabetes mellitus type 2, Graves' disease, hypertension, and more presents to the ED  with a chief complaint of generalized weakness.  Patient reports that started 2 days ago.  Has been progressively worse since it started.  Now she cannot walk.  She reports that the weakness is not asymmetric.  At baseline she is able to walk without any assistive device.  She lives with her sister who is able to help her, but she is mostly independent.  She reports a cramping pain in her bilateral upper extremities.  She reports that this has been going on since she has been on chemo, but it seems worse for the last couple of days.  Patient also reports dysphagia as its been going on since November.  She can only swallow liquids and soft foods.  She reports she been taking boost and Ensure at home.  Patient denies any fevers, cough, nausea/vomiting, abdominal pain, dysuria.  The generalized weakness is the specific complaint.     Patient is currently undergoing treatment for gastric adenocarcinoma.  Her last chemo was a week before last, her next treatment is May 11.  Patient follows with Dr. Delton Coombes who has been consulted.  ?  ?Assessment / Plan / Recommendation  ?Clinical Impression ? Clinical swallow evaluation completed at bedside with family present. Pt with limited head/neck rotation to her left. Oral motor examination is otherwise WNL. Pt consumed limited amounts of water via cup/straw, puree, and regular textures. Pt appears guarded with solid food intake (takes tiny bites and makes a facial grimace with swallow but denies discomfort). She shows no overt signs of aspiration. She reports  occasional globus sensation, however this has been present since October and is what led her to seek GI and eventual EGD in November. Suspect that Pt's dysphagia is due to esophageal dysphagia in setting of gastric cancer and now going through chemotherapy. Pt was expectorating mucous up until a few days ago, likely as a result of last chemo treatment. Although Pt points to her neck as source of occasional globus, suspect  this is referred from stomach/esophagus. Pt is scheduled for outpatient MBSS next week, however a barium swallow/barium pill esophagram would likely be more beneficial given the above. Recommend BaSwallow and d/c MBSS unless MD desires. Continue diet as ordered and family to bring in foods that Pt may find more palatable (family had a hard time with this at home as well as nothing was appealing). Pt may benefit from [place] on tray and does like butter pecan flavoring. ?SLP Visit Diagnosis: Dysphagia, unspecified (R13.10) ?   ?Aspiration Risk ? Risk for inadequate nutrition/hydration  ?  ?Diet Recommendation Dysphagia 3 (Mech soft);Thin liquid;Regular  ? ?Liquid Administration via: Cup;Straw ?Medication Administration: Whole meds with liquid ?Supervision: Patient able to self feed ?Postural Changes: Seated upright at 90 degrees;Remain upright for at least 30 minutes after po intake  ?  ?Other  Recommendations Recommended Consults: Consider esophageal assessment ?Oral Care Recommendations: Oral care BID;Staff/trained caregiver to provide oral care ?Other Recommendations: Clarify dietary restrictions   ? ?Recommendations for follow up therapy are one component of a multi-disciplinary discharge planning process, led by the attending physician.  Recommendations may be updated based on patient status, additional functional criteria and insurance authorization. ? ?Follow up Recommendations No SLP follow up  ? ? ?  ?Assistance Recommended at Discharge None  ?Functional Status Assessment Patient has not had a recent decline in their functional status  ?Frequency and Duration    ?  ?  ?   ? ?Prognosis Prognosis for Safe Diet Advancement: Fair ?Barriers/Prognosis Comment: gastric cancer and chemotherapy  ? ?  ? ?Swallow Study   ?General Date of Onset: 07/02/21 ?HPI: 66 y.o. female with medical history significant of with history of diabetes mellitus type 2, Graves' disease, hypertension, and more presents to the ED with a  chief complaint of generalized weakness.  Patient reports that started 2 days ago.  Has been progressively worse since it started.  Now she cannot walk.  She reports that the weakness is not asymmetric.  At baseline she is able to walk without any assistive device.  She lives with her sister who is able to help her, but she is mostly independent.  She reports a cramping pain in her bilateral upper extremities.  She reports that this has been going on since she has been on chemo, but it seems worse for the last couple of days.  Patient also reports dysphagia as its been going on since November.  She can only swallow liquids and soft foods.  She reports she been taking boost and Ensure at home.  Patient denies any fevers, cough, nausea/vomiting, abdominal pain, dysuria.  The generalized weakness is the specific complaint.     Patient is currently undergoing treatment for gastric adenocarcinoma.  Her last chemo was a week before last, her next treatment is May 11.  Patient follows with Dr. Delton Coombes who has been consulted. ?Type of Study: Bedside Swallow Evaluation ?Previous Swallow Assessment: EGD in November ?Diet Prior to this Study: Dysphagia 3 (soft);Thin liquids ?Temperature Spikes Noted: No ?Respiratory Status: Room air ?History of Recent Intubation:  No ?Behavior/Cognition: Alert;Cooperative;Pleasant mood ?Oral Cavity Assessment: Within Functional Limits ?Oral Care Completed by SLP: Recent completion by staff ?Oral Cavity - Dentition: Adequate natural dentition ?Vision: Functional for self-feeding ?Self-Feeding Abilities: Able to feed self ?Patient Positioning: Upright in bed (limited neck rotation to her left) ?Baseline Vocal Quality: Normal ?Volitional Cough: Strong ?Volitional Swallow: Able to elicit  ?  ?Oral/Motor/Sensory Function Overall Oral Motor/Sensory Function: Within functional limits   ?Ice Chips Ice chips: Within functional limits ?Presentation: Spoon   ?Thin Liquid Thin Liquid: Within functional  limits ?Presentation: Cup;Self Fed;Straw  ?  ?Nectar Thick Nectar Thick Liquid: Not tested   ?Honey Thick Honey Thick Liquid: Not tested   ?Puree Puree: Within functional limits ?Presentation: Spoon   ?

## 2021-07-02 NOTE — TOC Progression Note (Signed)
Transition of Care (TOC) - Progression Note  ? ? ?Patient Details  ?Name: Hannah Knight ?MRN: [id number] ?Date of Birth: 1955-12-17 ? ?Transition of Care (TOC) CM/SW Contact  ?Salome Arnt, LCSW ?Phone Number: ?07/02/2021, 1:38 PM ? ?Clinical Narrative:  LCSW presented bed offers and pt's sisters choose [facility]. Pt asleep at time of visit. Facility notified and will start authorization. Possible d/c tomorrow per MD.   ? ? ? ?Expected Discharge Plan: [place] ?Barriers to Discharge: Continued Medical Work up ? ?Expected Discharge Plan and Services ?Expected Discharge Plan: [place] ?In-house Referral: Clinical Social Work ?  ?Post Acute Care Choice: [place] ?Living arrangements for the past 2 months: [place] ?                ?  ?  ?  ?  ?  ?  ?  ?  ?  ?  ? ? ?Social Determinants of Health (SDOH) Interventions ?  ? ?Readmission Risk Interventions ? ?  07/02/2021  ? 11:03 AM  ?Readmission Risk Prevention Plan  ?Transportation Screening Complete  ?[place] or Home Care Consult Complete  ?Social Work Consult for [place] Planning/Counseling Complete  ?Palliative Care Screening Not Applicable  ?Medication Review Press photographer) Complete  ? ? ?

## 2021-07-02 NOTE — NC FL2 (Signed)
?[place] MEDICAID FL2 LEVEL OF CARE SCREENING TOOL  ?  ? ?IDENTIFICATION  ?Patient Name: ?Hannah Knight Birthdate: Sep 20, 1955 Sex: female Admission Date (Current Location): ?06/29/2021  ?[place] and [place] Number: ? [place] and Address:  ?[facility] [address], [place] ?     Provider Number: ?[id number]  ?Attending Physician Name and Address:  ?Murlean Iba, MD ? Relative Name and Phone Number:  ?  ?   ?Current Level of Care: ?Hospital Recommended Level of Care: ?[place] Prior Approval Number: ?  ? ?Date Approved/Denied: ?  PASRR Number: ?[id number] A ? ?Discharge Plan: ?SNF ?  ? ?Current Diagnoses: ?Patient Active Problem List  ? Diagnosis Date Noted  ? Pressure injury of skin 07/01/2021  ? Symptomatic anemia 07/01/2021  ? Pulmonary embolus ([place]) 06/30/2021  ? Generalized weakness 06/29/2021  ? Neutropenia, drug-induced ([place]) 04/02/2021  ? Port-A-Cath in place 03/08/2021  ? Gastric cancer ([place]) 02/08/2021  ? Dysphagia 12/26/2020  ? Dyspepsia 12/26/2020  ? Hypothyroidism 02/22/2019  ? Need for vaccination against Streptococcus pneumoniae using pneumococcal conjugate vaccine 13 01/23/2019  ? Screening breast examination 01/23/2019  ? Hypothyroidism following radioiodine therapy 08/08/2015  ? Diabetes mellitus without complication ([place]) 26/94/8546  ? Essential hypertension, benign 08/08/2015  ? Obesity due to excess calories 08/08/2015  ? Internal hemorrhoids with other complication 27/05/5007  ? Rectal bleeding 06/17/2013  ? FH: colon cancer 06/17/2013  ? ? ?Orientation RESPIRATION BLADDER Height & Weight   ?  ?Self, Time, Situation, Place ? Normal External catheter Weight: 142 lb 6.7 oz (64.6 kg) (unable to obtain standing weight due to weakness) ?Height:  '5\' 5"'$  (165.1 cm)  ?BEHAVIORAL SYMPTOMS/MOOD NEUROLOGICAL BOWEL NUTRITION STATUS  ?    Incontinent Diet (Dysphagia 3 with thin liquids.)  ?AMBULATORY STATUS COMMUNICATION OF NEEDS Skin    ?Extensive Assist Verbally PU Stage and Appropriate Care ?  ?PU Stage 2 Dressing:  (Every 3 days.) ?  ?    ?     ?     ? ? ?Personal Care Assistance Level of Assistance  ?Bathing, Feeding, Dressing Bathing Assistance: Maximum assistance ?Feeding assistance: Limited assistance ?Dressing Assistance: Maximum assistance ?   ? ?Functional Limitations Info  ?Sight, Hearing, Speech Sight Info: Impaired ?Hearing Info: Adequate ?Speech Info: Adequate  ? ? ?SPECIAL CARE FACTORS FREQUENCY  ?PT (By licensed PT)   ?  ?PT Frequency: 5x weekly ?  ?  ?  ?  ?   ? ? ?Contractures    ? ? ?Additional Factors Info  ?Code Status, Allergies Code Status Info: DNR ?Allergies Info: Ace Inhibitors ?  ?  ?  ?   ? ?Current Medications (07/02/2021):  This is the current hospital active medication list ?Current Facility-Administered Medications  ?Medication Dose Route Frequency Provider Last Rate Last Admin  ? 0.9 %  sodium chloride infusion   Intravenous Continuous Wynetta Emery, Clanford L, MD 75 mL/hr at 07/02/21 0839 New Bag at 07/02/21 0839  ? acetaminophen (TYLENOL) tablet 650 mg  650 mg Oral Q6H PRN Zierle-Ghosh, Asia B, DO      ? Or  ? acetaminophen (TYLENOL) suppository 650 mg  650 mg Rectal Q6H PRN Zierle-Ghosh, Asia B, DO      ? apixaban (ELIQUIS) tablet 5 mg  5 mg Oral BID Zierle-Ghosh, Asia B, DO   5 mg at 07/02/21 0834  ? Chlorhexidine Gluconate Cloth 2 % PADS 6 each  6 each Topical Daily Murlean Iba, MD  6 each at 07/02/21 0835  ? feeding supplement (BOOST / RESOURCE BREEZE) liquid 1 Container  1 Container Oral TID BM Zierle-Ghosh, Asia B, DO   1 Container at 07/02/21 0835  ? feeding supplement (ENSURE ENLIVE / ENSURE PLUS) liquid 237 mL  237 mL Oral BID BM Zierle-Ghosh, Asia B, DO   237 mL at 07/01/21 1348  ? insulin aspart (novoLOG) injection 0-15 Units  0-15 Units Subcutaneous TID WC Zierle-Ghosh, Asia B, DO      ? levothyroxine (SYNTHROID) tablet 100 mcg  100 mcg Oral Q0600 Wynetta Emery, Clanford L, MD   100 mcg at 07/02/21 0504   ? metoprolol tartrate (LOPRESSOR) tablet 12.5 mg  12.5 mg Oral BID Wynetta Emery, Clanford L, MD   12.5 mg at 07/02/21 0834  ? ondansetron (ZOFRAN) tablet 4 mg  4 mg Oral Q6H PRN Zierle-Ghosh, Asia B, DO      ? Or  ? ondansetron (ZOFRAN) injection 4 mg  4 mg Intravenous Q6H PRN Zierle-Ghosh, Asia B, DO      ? oxyCODONE (Oxy IR/ROXICODONE) immediate release tablet 5 mg  5 mg Oral Q4H PRN Zierle-Ghosh, Asia B, DO   5 mg at 07/01/21 1127  ? potassium chloride 10 mEq in 100 mL IVPB  10 mEq Intravenous Q1 Hr x 4 Johnson, Clanford L, MD      ? prochlorperazine (COMPAZINE) injection 10 mg  10 mg Intravenous Q6H PRN Zierle-Ghosh, Asia B, DO      ? ? ? ?Discharge Medications: ?Please see discharge summary for a list of discharge medications. ? ?Relevant Imaging Results: ? ?Relevant Lab Results: ? ? ?Additional Information ?SSN: [id number]. COVID vaccinations x3. Chemo every 14 days. ? ?Salome Arnt, LCSW ? ? ? ? ?

## 2021-07-02 NOTE — TOC Initial Note (Signed)
Transition of Care (TOC) - Initial/Assessment Note  ? ? ?Patient Details  ?Name: Hannah Knight ?MRN: [id number] ?Date of Birth: [birth year] ? ?Transition of Care (TOC) CM/SW Contact:    ?Salome Arnt, LCSW ?Phone Number: ?07/02/2021, 11:06 AM ? ?Clinical Narrative:  Pt admitted due to generalized weakness. LCSW met with pt and sister, Kappi at bedside. Pt reports she lives with Kappi and she is her caregiver at home. Pt has required more assistance recently with ADLs due to weakness. She ambulates with a walker. Pt reports she has chemo every 14 days for gastric cancer. PT evaluated pt and recommend SNF. Discussed with pt who is agreeable to [place] or [place].  LCSW will initiate bed search.              ? ? ?Expected Discharge Plan: [place] ?Barriers to Discharge: Continued Medical Work up ? ? ?Patient Goals and CMS Choice ?Patient states their goals for this hospitalization and ongoing recovery are:: short term SNF ?  ?Choice offered to / list presented to : Patient ? ?Expected Discharge Plan and Services ?Expected Discharge Plan: [place] ?In-house Referral: Clinical Social Work ?  ?Post Acute Care Choice: [place] ?Living arrangements for the past 2 months: [place] ?                ?  ?  ?  ?  ?  ?  ?  ?  ?  ?  ? ?Prior Living Arrangements/Services ?Living arrangements for the past 2 months: [place] ?Lives with:: Siblings ?Patient language and need for interpreter reviewed:: Yes ?Do you feel safe going back to the place where you live?: Yes      ?Need for Family Participation in Patient Care: Yes (Comment) ?Care giver support system in place?: Yes (comment) ?Current home services: DME (walker, BSC, shower chair) ?Criminal Activity/Legal Involvement Pertinent to Current Situation/Hospitalization: No - Comment as needed ? ?Activities of Daily Living ?Home Assistive Devices/Equipment: Scales, Walker (specify type), CBG  Meter ?ADL Screening (condition at time of admission) ?Patient's cognitive ability adequate to safely complete daily activities?: Yes ?Is the patient deaf or have difficulty hearing?: No ?Does the patient have difficulty seeing, even when wearing glasses/contacts?: No ?Does the patient have difficulty concentrating, remembering, or making decisions?: No ?Patient able to express need for assistance with ADLs?: Yes ?Does the patient have difficulty dressing or bathing?: Yes ?Independently performs ADLs?: No ?Communication: Independent ?Dressing (OT): Dependent ?Is this a change from baseline?: Change from baseline, expected to last <3days ?Grooming: Independent ?Feeding: Independent ?Bathing: Dependent ?Is this a change from baseline?: Change from baseline, expected to last <3 days ?Toileting: Dependent ?Is this a change from baseline?: Change from baseline, expected to last <3 days ?In/Out Bed: Dependent ?Is this a change from baseline?: Change from baseline, expected to last <3 days ?Walks in Home: Dependent ?Is this a change from baseline?: Change from baseline, expected to last <3 days ?Does the patient have difficulty walking or climbing stairs?: Yes ?Weakness of Legs: Both ?Weakness of Arms/Hands: Both ? ?Permission Sought/Granted ?  ?Permission granted to share information with : Yes, Verbal Permission Granted ? Share Information with NAME: Kippi ?   ? Permission granted to share info w Relationship: sister ?   ? ?Emotional Assessment ?  ?  ?Affect (typically observed): Appropriate ?Orientation: : Oriented to Self, Oriented to Place, Oriented to  Time, Oriented to Situation ?Alcohol / Substance Use: Not Applicable ?Psych Involvement: No (  comment) ? ?Admission diagnosis:  Dehydration [E86.0] ?Weakness [R53.1] ?Generalized weakness [R53.1] ?Nausea and vomiting, unspecified vomiting type [R11.2] ?Symptomatic anemia [D64.9] ?Patient Active Problem List  ? Diagnosis Date Noted  ? Pressure injury of skin 07/01/2021   ? Symptomatic anemia 07/01/2021  ? Pulmonary embolus ([place]) 06/30/2021  ? Generalized weakness 06/29/2021  ? Neutropenia, drug-induced ([place]) 04/02/2021  ? Port-A-Cath in place 03/08/2021  ? Gastric cancer ([facility]) 02/08/2021  ? Dysphagia 12/26/2020  ? Dyspepsia 12/26/2020  ? Hypothyroidism 02/22/2019  ? Need for vaccination against Streptococcus pneumoniae using pneumococcal conjugate vaccine 13 01/23/2019  ? Screening breast examination 01/23/2019  ? Hypothyroidism following radioiodine therapy 08/08/2015  ? Diabetes mellitus without complication ([place]) 53/97/6734  ? Essential hypertension, benign 08/08/2015  ? Obesity due to excess calories 08/08/2015  ? Internal hemorrhoids with other complication 19/37/9024  ? Rectal bleeding 06/17/2013  ? FH: colon cancer 06/17/2013  ? ?PCP:  Celene Squibb, MD ?Pharmacy:   ?[facility] [place], [place] [facility] [place] ?[address] ?[place] [address] ?Phone: [phone] Fax: [phone] ? ? ? ? ?Social Determinants of Health (SDOH) Interventions ?  ? ?Readmission Risk Interventions ? ?  07/02/2021  ? 11:03 AM  ?Readmission Risk Prevention Plan  ?Transportation Screening Complete  ?[place] or Home Care Consult Complete  ?Social Work Consult for [place] Planning/Counseling Complete  ?Palliative Care Screening Not Applicable  ?Medication Review Press photographer) Complete  ? ? ? ?

## 2021-07-03 ENCOUNTER — Ambulatory Visit (HOSPITAL_COMMUNITY): Payer: 59 | Admitting: Hematology

## 2021-07-03 ENCOUNTER — Inpatient Hospital Stay (HOSPITAL_COMMUNITY): Payer: 59

## 2021-07-03 ENCOUNTER — Other Ambulatory Visit (HOSPITAL_COMMUNITY): Payer: 59

## 2021-07-03 ENCOUNTER — Ambulatory Visit (HOSPITAL_COMMUNITY): Payer: 59

## 2021-07-03 DIAGNOSIS — D649 Anemia, unspecified: Secondary | ICD-10-CM

## 2021-07-03 DIAGNOSIS — N390 Urinary tract infection, site not specified: Secondary | ICD-10-CM | POA: Diagnosis present

## 2021-07-03 DIAGNOSIS — C160 Malignant neoplasm of cardia: Secondary | ICD-10-CM

## 2021-07-03 LAB — CBC
HCT: 35.3 % — ABNORMAL LOW (ref 36.0–46.0)
Hemoglobin: 11.7 g/dL — ABNORMAL LOW (ref 12.0–15.0)
MCH: 28.7 pg (ref 26.0–34.0)
MCHC: 33.1 g/dL (ref 30.0–36.0)
MCV: 86.5 fL (ref 80.0–100.0)
Platelets: 247 10*3/uL (ref 150–400)
RBC: 4.08 MIL/uL (ref 3.87–5.11)
RDW: 18.3 % — ABNORMAL HIGH (ref 11.5–15.5)
WBC: 19.9 10*3/uL — ABNORMAL HIGH (ref 4.0–10.5)
nRBC: 0 % (ref 0.0–0.2)

## 2021-07-03 LAB — DIFFERENTIAL
Abs Immature Granulocytes: 0.21 10*3/uL — ABNORMAL HIGH (ref 0.00–0.07)
Basophils Absolute: 0.1 10*3/uL (ref 0.0–0.1)
Basophils Relative: 0 %
Eosinophils Absolute: 0.1 10*3/uL (ref 0.0–0.5)
Eosinophils Relative: 0 %
Immature Granulocytes: 1 %
Lymphocytes Relative: 10 %
Lymphs Abs: 2 10*3/uL (ref 0.7–4.0)
Monocytes Absolute: 1.1 10*3/uL — ABNORMAL HIGH (ref 0.1–1.0)
Monocytes Relative: 6 %
Neutro Abs: 16.4 10*3/uL — ABNORMAL HIGH (ref 1.7–7.7)
Neutrophils Relative %: 83 %

## 2021-07-03 LAB — BASIC METABOLIC PANEL
Anion gap: 5 (ref 5–15)
BUN: 14 mg/dL (ref 8–23)
CO2: 17 mmol/L — ABNORMAL LOW (ref 22–32)
Calcium: 8.6 mg/dL — ABNORMAL LOW (ref 8.9–10.3)
Chloride: 112 mmol/L — ABNORMAL HIGH (ref 98–111)
Creatinine, Ser: 0.86 mg/dL (ref 0.44–1.00)
GFR, Estimated: >60 mL/min (ref >60)
Glucose, Bld: 117 mg/dL — ABNORMAL HIGH (ref 70–99)
Potassium: 4.1 mmol/L (ref 3.5–5.1)
Sodium: 134 mmol/L — ABNORMAL LOW (ref 135–145)

## 2021-07-03 LAB — PROCALCITONIN: Procalcitonin: 0.5 ng/mL

## 2021-07-03 LAB — GLUCOSE, CAPILLARY
Glucose-Capillary: 118 mg/dL — ABNORMAL HIGH (ref 70–99)
Glucose-Capillary: 112 mg/dL — ABNORMAL HIGH (ref 70–99)
Glucose-Capillary: 109 mg/dL — ABNORMAL HIGH (ref 70–99)
Glucose-Capillary: 102 mg/dL — ABNORMAL HIGH (ref 70–99)

## 2021-07-03 LAB — MAGNESIUM: Magnesium: 2.2 mg/dL (ref 1.7–2.4)

## 2021-07-03 MED ORDER — SODIUM CHLORIDE 0.9 % IV SOLN
2.0000 g | INTRAVENOUS | Status: DC
  Administered 2021-07-03 – 2021-07-04 (×2): 2 g via INTRAVENOUS
  Filled 2021-07-03 (×2): qty 20

## 2021-07-03 NOTE — Progress Notes (Signed)
Patient has been NPO since midnight.

## 2021-07-03 NOTE — Assessment & Plan Note (Signed)
--   Urine culture positive  ?-- ceftriaxone 2 gm IV ordered  ?-- pt reporting some dysuria symptoms  ?

## 2021-07-03 NOTE — Assessment & Plan Note (Signed)
--   appreciate SLP evaluation ?-- recommended esophagram which is being done 07/03/21 ?

## 2021-07-03 NOTE — Consult Note (Signed)
[facility] ?Consultation Oncology ? ?Name: Hannah Knight      MRN: [id number]    Location: A310/A310-01  Date: 07/03/2021 Time:5:17 PM ? ? ?REFERRING PHYSICIAN: Dr. Wynetta Emery ? ?REASON FOR CONSULT: Stage IV gastric cancer ?  ?DIAGNOSIS: Weakness secondary to chemotherapy and malnutrition ? ?HISTORY OF PRESENT ILLNESS: Hannah Knight is a 66 year old very pleasant African-American female seen in consultation today at the request of Dr. Wynetta Emery.  This patient is known to me from office visits.  Her last chemotherapy was on 06/19/2021.  She was admitted with generalized weakness and fall.  She was found to have E. coli UTI.  She also received 2 units PRBC for anemia.  She reported that she was barely able to walk to the bathroom with the help of physical therapy.  She is being treated with ceftriaxone for E. coli UTI.  She reports that she is doing slightly better overall. ? ?PAST MEDICAL HISTORY:   ?Past Medical History:  ?Diagnosis Date  ? Allergy   ? Arthritis   ? Diabetes mellitus   ? Graves disease   ? with radiation, now on Synthroid  ? Graves disease   ? Hypertension   ? Port-A-Cath in place 03/08/2021  ? ? ?ALLERGIES: ?Allergies  ?Allergen Reactions  ? Ace Inhibitors Anaphylaxis and Swelling  ? ?   ?MEDICATIONS: I have reviewed the patient's current medications.   ?  ?PAST SURGICAL HISTORY ?Past Surgical History:  ?Procedure Laterality Date  ? BIOPSY  01/23/2021  ? Procedure: BIOPSY;  Surgeon: [name], DO;  [facility];  Service: Endoscopy;;  ? CHOLECYSTECTOMY    ? COLONOSCOPY  09/22/2007  ? SLF: moderate internal hemorrhoids, multiple 3-4 mm sessile polyps in splenic flexure, hyperplastic.   ? COLONOSCOPY  09/27/2002  ? Rehman:Small external hemorrhoids/Six tiny   polyps ablated by cold biopsy from sigmoid colon  ? COLONOSCOPY N/A 06/29/2013  ? Moderate sized internal hemorrhoids. Sigmoid diverticulosis. Normal TI. Banding X 3.  ? COLONOSCOPY WITH PROPOFOL N/A 01/23/2021  ?  Procedure: COLONOSCOPY WITH PROPOFOL;  Surgeon: [name], DO;  [facility];  Service: Endoscopy;  Laterality: N/A;  8:00am  ? ESOPHAGOGASTRODUODENOSCOPY (EGD) WITH PROPOFOL N/A 01/23/2021  ? Procedure: ESOPHAGOGASTRODUODENOSCOPY (EGD) WITH PROPOFOL;  Surgeon: [name], DO;  [facility];  Service: Endoscopy;  Laterality: N/A;  ? HEMORRHOID BANDING  06/29/2013  ? Procedure: HEMORRHOID BANDING;  Surgeon: Danie Binder, MD;  Location: [facility];  Service: Endoscopy;;  ? IR IMAGING GUIDED PORT INSERTION  02/21/2021  ? IR US GUIDE BX ASP/DRAIN  02/21/2021  ? TUBAL LIGATION    ? ? ?FAMILY HISTORY: ?Family History  ?Problem Relation Age of Onset  ? Colon cancer Father 106  ?     deceased  ? Diabetes Mother   ? Heart disease Mother   ? Cancer - Lung Brother   ? ? ?SOCIAL HISTORY: ? reports that she has never smoked. She has never used smokeless tobacco. She reports that she does not drink alcohol and does not use drugs. ? ?PERFORMANCE STATUS: ?The patient's performance status is 3 - Symptomatic, >50% confined to bed ? ?PHYSICAL EXAM: ?Most Recent Vital Signs: Blood pressure 139/80, pulse 63, temperature 97.9 ?F (36.6 ?C), temperature source Oral, resp. rate (!) 22, height '5\' 5"'$  (1.651 m), weight 142 lb 6.7 oz (64.6 kg), SpO2 98 %. ?BP 139/80 (BP Location: Left Arm)   Pulse 63   Temp 97.9 ?F (36.6 ?C) (  Oral)   Resp (!) 22   Ht '5\' 5"'$  (1.651 m)   Wt 142 lb 6.7 oz (64.6 kg) Comment: unable to obtain standing weight due to weakness  SpO2 98%   BMI 23.70 kg/m?  ?General appearance: alert, cooperative, and appears stated age ?Lungs: clear to auscultation bilaterally ?Heart: regular rate and rhythm ?Abdomen:  Soft, nontender with no palpable masses. ?Extremities:  No edema or cyanosis. ?Neurologic: Grossly normal ? ?LABORATORY DATA:  ?Results for orders placed or performed during the hospital encounter of 06/29/21 (from the past 48 hour(s))  ?Glucose, capillary     Status:  Abnormal  ? Collection Time: 07/01/21  5:58 PM  ?Result Value Ref Range  ? Glucose-Capillary 112 (H) 70 - 99 mg/dL  ?  Comment: Glucose reference range applies only to samples taken after fasting for at least 8 hours.  ?Glucose, capillary     Status: Abnormal  ? Collection Time: 07/01/21  9:04 PM  ?Result Value Ref Range  ? Glucose-Capillary 119 (H) 70 - 99 mg/dL  ?  Comment: Glucose reference range applies only to samples taken after fasting for at least 8 hours.  ? Comment 1 Notify RN   ? Comment 2 Document in Chart   ?Basic metabolic panel     Status: Abnormal  ? Collection Time: 07/02/21  4:04 AM  ?Result Value Ref Range  ? Sodium 137 135 - 145 mmol/L  ? Potassium 3.2 (L) 3.5 - 5.1 mmol/L  ? Chloride 118 (H) 98 - 111 mmol/L  ? CO2 15 (L) 22 - 32 mmol/L  ? Glucose, Bld 100 (H) 70 - 99 mg/dL  ?  Comment: Glucose reference range applies only to samples taken after fasting for at least 8 hours.  ? BUN 15 8 - 23 mg/dL  ? Creatinine, Ser 0.80 0.44 - 1.00 mg/dL  ? Calcium 7.5 (L) 8.9 - 10.3 mg/dL  ? GFR, Estimated >60 >60 mL/min  ?  Comment: (NOTE) ?Calculated using the CKD-EPI Creatinine Equation (2021) ?  ? Anion gap 4 (L) 5 - 15  ?  Comment: Performed at [facility], [address], [place], [place] [address]  ?CBC     Status: Abnormal  ? Collection Time: 07/02/21  4:04 AM  ?Result Value Ref Range  ? WBC 15.9 (H) 4.0 - 10.5 K/uL  ? RBC 3.30 (L) 3.87 - 5.11 MIL/uL  ? Hemoglobin 9.5 (L) 12.0 - 15.0 g/dL  ? HCT 28.7 (L) 36.0 - 46.0 %  ? MCV 87.0 80.0 - 100.0 fL  ? MCH 28.8 26.0 - 34.0 pg  ? MCHC 33.1 30.0 - 36.0 g/dL  ? RDW 18.5 (H) 11.5 - 15.5 %  ? Platelets 191 150 - 400 K/uL  ? nRBC 0.2 0.0 - 0.2 %  ?  Comment: Performed at [facility], [address] [place], [place] [address]  ?Magnesium     Status: Abnormal  ? Collection Time: 07/02/21  4:04 AM  ?Result Value Ref Range  ? Magnesium 1.6 (L) 1.7 - 2.4 mg/dL  ?  Comment: Performed at [facility], [address], [place], [place] [address]  ?Glucose, capillary      Status: Abnormal  ? Collection Time: 07/02/21  7:34 AM  ?Result Value Ref Range  ? Glucose-Capillary 108 (H) 70 - 99 mg/dL  ?  Comment: Glucose reference range applies only to samples taken after fasting for at least 8 hours.  ?Glucose, capillary     Status: Abnormal  ? Collection Time: 07/02/21  11:27 AM  ?Result Value Ref Range  ? Glucose-Capillary 128 (H) 70 - 99 mg/dL  ?  Comment: Glucose reference range applies only to samples taken after fasting for at least 8 hours.  ?Glucose, capillary     Status: Abnormal  ? Collection Time: 07/02/21  4:26 PM  ?Result Value Ref Range  ? Glucose-Capillary 118 (H) 70 - 99 mg/dL  ?  Comment: Glucose reference range applies only to samples taken after fasting for at least 8 hours.  ?Glucose, capillary     Status: Abnormal  ? Collection Time: 07/02/21  9:54 PM  ?Result Value Ref Range  ? Glucose-Capillary 145 (H) 70 - 99 mg/dL  ?  Comment: Glucose reference range applies only to samples taken after fasting for at least 8 hours.  ? Comment 1 Notify RN   ?Occult blood card to lab, stool     Status: None  ? Collection Time: 07/02/21 10:41 PM  ?Result Value Ref Range  ? Fecal Occult Bld NEGATIVE NEGATIVE  ?  Comment: Performed at [facility], [address] [place], [place] [address]  ?CBC     Status: Abnormal  ? Collection Time: 07/03/21  4:44 AM  ?Result Value Ref Range  ? WBC 19.9 (H) 4.0 - 10.5 K/uL  ? RBC 4.08 3.87 - 5.11 MIL/uL  ? Hemoglobin 11.7 (L) 12.0 - 15.0 g/dL  ? HCT 35.3 (L) 36.0 - 46.0 %  ? MCV 86.5 80.0 - 100.0 fL  ? MCH 28.7 26.0 - 34.0 pg  ? MCHC 33.1 30.0 - 36.0 g/dL  ? RDW 18.3 (H) 11.5 - 15.5 %  ? Platelets 247 150 - 400 K/uL  ? nRBC 0.0 0.0 - 0.2 %  ?  Comment: Performed at [facility], [address], [place], [place] [address]  ?Basic metabolic panel     Status: Abnormal  ? Collection Time: 07/03/21  4:44 AM  ?Result Value Ref Range  ? Sodium 134 (L) 135 - 145 mmol/L  ? Potassium 4.1 3.5 - 5.1 mmol/L  ?  Comment: DELTA CHECK NOTED  ? Chloride 112 (H) 98 - 111  mmol/L  ? CO2 17 (L) 22 - 32 mmol/L  ? Glucose, Bld 117 (H) 70 - 99 mg/dL  ?  Comment: Glucose reference range applies only to samples taken after fasting for at least 8 hours.  ? BUN 14 8 - 23 mg/dL  ? Creatinine, Se

## 2021-07-03 NOTE — Assessment & Plan Note (Signed)
--   pt having decreased appetite related to UTI, gastric cancer and ongoing chemotherapy.  ?-- appreciate SLP for swallow eval, esophagram ordered for 5/2.  ? ?

## 2021-07-03 NOTE — Progress Notes (Signed)
Patient had bm. Occult blood card sent to lab. ?

## 2021-07-03 NOTE — Progress Notes (Signed)
?PROGRESS NOTE ? ? ?Hannah Knight  FXT:[id number] DOB: [birth year] DOA: 06/29/2021 ?PCP: Celene Squibb, MD  ? ?Chief Complaint  ?Patient presents with  ? Weakness  ? ?Level of care: Med-Surg ? ?Brief Admission History:  ?66 y.o. female with medical history significant of with history of diabetes mellitus type 2, Graves' disease, hypertension, and more presents to the ED with a chief complaint of generalized weakness.  Patient reports that started 2 days ago.  Has been progressively worse since it started.  Now she cannot walk.  She reports that the weakness is not asymmetric.  At baseline she is able to walk without any assistive device.  She lives with her sister who is able to help her, but she is mostly independent.  She reports a cramping pain in her bilateral upper extremities.  She reports that this has been going on since she has been on chemo, but it seems worse for the last couple of days.  Patient also reports dysphagia as its been going on since November.  She can only swallow liquids and soft foods.  She reports she been taking boost and Ensure at home.  Patient denies any fevers, cough, nausea/vomiting, abdominal pain, dysuria.  The generalized weakness is the specific complaint. ?  ?Patient is currently undergoing treatment for gastric adenocarcinoma.  Her last chemo was a week before last, her next treatment is May 11.  Patient follows with Dr. Delton Coombes who has been consulted. ?  ?Patient reports she does not smoke, does not drink, does not use illicit drugs.  She is vaccinated for COVID.  Patient is DNR. ?  ?Assessment and Plan: ?* Generalized weakness ?-2/2 chemo, deconditioning, poor PO intake and anemia  ?--PT eval and treat recommending SNF.  TOC working on SNF placement.   ?-Continue to monitor ?-transfused 2 unit PRBC on 4/30  ? ? ?E. coli UTI ?-- Urine culture positive  ?-- ceftriaxone 2 gm IV ordered  ?-- pt reporting some dysuria symptoms  ? ?Hypokalemia ?-- IV replacement ordered and  Magnesium was replaced as well.  ?-- recheck in AM  ? ?Hypomagnesemia ?-- IV replacement ordered, recheck in AM.  ? ?Malnutrition of moderate degree ?-- pt having decreased appetite related to UTI, gastric cancer and ongoing chemotherapy.  ?-- appreciate SLP for swallow eval, esophagram ordered for 5/2.  ? ? ?Symptomatic anemia ?-- Hg down to 7.1 from 9 recently tested ?-- no signs of bleeding found ?-- suspect from recent chemotherapy ?-- transfuse 2 units PRBC ?-- recheck CBC with Hg improved to 9.1 ?-- follow ? ?Pressure injury of skin ?Pressure Injury 06/30/21 Sacrum Stage 2 -  Partial thickness loss of dermis presenting as a shallow open injury with a red, pink wound bed without slough. (Active)  ?06/30/21 2000  ?Location: Sacrum  ?Location Orientation:   ?Staging: Stage 2 -  Partial thickness loss of dermis presenting as a shallow open injury with a red, pink wound bed without slough.  ?Wound Description (Comments):   ?Present on Admission: Yes  ? ? ?--continue skin care protocol  ? ?Pulmonary embolus ([facility]) ?-- continue apixaban for full anticoagulation ?-- no evidence of GI bleeding at this time but following ? ?Gastric cancer ([place]) ?Last chemo week before last ?Next chemo scheduled for May 11th ?Follow up with oncology after discharge ? ? ? ?Dysphagia ?-- appreciate SLP evaluation ?-- recommended esophagram which is being done 07/03/21 ? ?Hypothyroidism ?--free T4 is high at 1.94 ?--reduced levothyroxine to 100 mcg daily ? ?Essential  hypertension, benign ?-No longer on losartan at home ?-metoprolol 12.5 mg BID added ? ?Diabetes mellitus without complication ([place]) ?-Hold metformin ?-sliding scale coverage ?-Continue to monitor ?CBG (last 3)  ?Recent Labs  ?  07/02/21 ?1626 07/02/21 ?2154 07/03/21 ?0751  ?GLUCAP 118* 145* 118*  ? ? ? ?Intractable nausea and vomiting-resolved as of 06/30/2021 ?- secondary to chemo? ?-Zofran q6HPRN for nausea ?-Compazine q 6H PRN for refractory nausea ?-Continue IV  fluids ?-Continue to monitor ? ?DVT prophylaxis: apixaban ?Code Status: DNR  ?Family Communication:  ?Disposition: anticipating SNF  ?  ?Consultants:  ?PT ?Procedures:  ? ?Antimicrobials:  ? Ceftriaxone 07/03/21 ?Subjective: ?Pt says she has been having some burning with urination and still with very poor appetite, she wants to continue with chemotherapy, agreeable to going to SNF today.  ? ?Objective: ?Vitals:  ? 07/02/21 0532 07/02/21 1342 07/02/21 2143 07/03/21 0530  ?BP: (!) 147/91 132/82 (!) 143/80 (!) 148/86  ?Pulse: 80 75 82 77  ?Resp: '16 20 20 17  '$ ?Temp: 98.6 ?F (37 ?C) 98.7 ?F (37.1 ?C) 98 ?F (36.7 ?C) 99.2 ?F (37.3 ?C)  ?TempSrc: Oral Oral Temporal   ?SpO2: 99% 100% 99% 100%  ?Weight:      ?Height:      ? ? ?Intake/Output Summary (Last 24 hours) at 07/03/2021 1034 ?Last data filed at 07/03/2021 0500 ?Gross per 24 hour  ?Intake 1171.08 ml  ?Output 1300 ml  ?Net -128.92 ml  ? ?Filed Weights  ? 06/29/21 1721 06/30/21 0026  ?Weight: 64.5 kg 64.6 kg  ? ?Examination: ? ?General exam: awake, alert, cooperative, appears chronically ill, lying supine in bed, appears frail and weak.   ?Respiratory system: Clear to auscultation. Respiratory effort normal. ?Cardiovascular system: normal S1 & S2 heard. No JVD, murmurs, rubs, gallops or clicks. No pedal edema. ?Gastrointestinal system: Abdomen is nondistended, soft. No organomegaly or masses felt. Normal bowel sounds heard. ?Central nervous system: Alert and oriented. No focal neurological deficits. ?Extremities: Symmetric 5 x 5 power. ?Skin: No rashes, lesions or ulcers. ?Psychiatry: Judgement and insight appear normal. Mood & affect flat.   ? ?Data Reviewed: I have personally reviewed following labs and imaging studies ? ?CBC: ?Recent Labs  ?Lab 06/29/21 ?1734 06/30/21 ?4627 07/01/21 ?0422 07/01/21 ?1659 07/02/21 ?0404 07/03/21 ?0444  ?WBC 5.7 6.9 11.3*  --  15.9* 19.9*  ?NEUTROABS 3.5 4.2  --   --   --  16.4*  ?HGB 9.1* 7.6* 7.1* 11.2* 9.5* 11.7*  ?HCT 27.9* 23.4* 23.1*  33.2* 28.7* 35.3*  ?MCV 90.3 90.7 90.9  --  87.0 86.5  ?PLT 203 173 207  --  191 247  ? ? ?Basic Metabolic Panel: ?Recent Labs  ?Lab 06/29/21 ?1734 06/30/21 ?0350 07/01/21 ?0422 07/02/21 ?0404 07/03/21 ?0444  ?NA 135 136 136 137 134*  ?K 3.6 3.1* 3.6 3.2* 4.1  ?CL 106 112* 115* 118* 112*  ?CO2 18* 19* 17* 15* 17*  ?GLUCOSE 204* 117* 102* 100* 117*  ?BUN '23 19 17 15 14  '$ ?CREATININE 1.35* 1.02* 1.00 0.80 0.86  ?CALCIUM 9.7 8.6* 8.4* 7.5* 8.6*  ?MG 1.8 1.6* 2.3 1.6* 2.2  ? ? ?CBG: ?Recent Labs  ?Lab 07/02/21 ?0734 07/02/21 ?1127 07/02/21 ?1626 07/02/21 ?2154 07/03/21 ?0751  ?GLUCAP 108* 128* 118* 145* 118*  ? ? ?Recent Results (from the past 240 hour(s))  ?Urine Culture     Status: Abnormal  ? Collection Time: 06/29/21  5:26 PM  ? Specimen: In/Out Cath Urine  ?Result Value Ref Range Status  ? Specimen  Description   Final  ?  IN/OUT CATH URINE ?Performed at [facility], [address], [place], [place] [address] ?  ? Special Requests   Final  ?  NONE ?Performed at [facility], [address], [place], [place] [address] ?  ? Culture 10,000 COLONIES/mL ESCHERICHIA COLI (A)  Final  ? Report Status 07/02/2021 FINAL  Final  ? Organism ID, Bacteria ESCHERICHIA COLI (A)  Final  ?    Susceptibility  ? Escherichia coli - MIC*  ?  AMPICILLIN <=2 SENSITIVE Sensitive   ?  CEFAZOLIN <=4 SENSITIVE Sensitive   ?  CEFEPIME <=0.12 SENSITIVE Sensitive   ?  CEFTRIAXONE <=0.25 SENSITIVE Sensitive   ?  CIPROFLOXACIN <=0.25 SENSITIVE Sensitive   ?  GENTAMICIN <=1 SENSITIVE Sensitive   ?  IMIPENEM <=0.25 SENSITIVE Sensitive   ?  NITROFURANTOIN <=16 SENSITIVE Sensitive   ?  TRIMETH/SULFA <=20 SENSITIVE Sensitive   ?  AMPICILLIN/SULBACTAM <=2 SENSITIVE Sensitive   ?  PIP/TAZO <=4 SENSITIVE Sensitive   ?  * 10,000 COLONIES/mL ESCHERICHIA COLI  ?Blood Culture (routine x 2)     Status: None (Preliminary result)  ? Collection Time: 06/29/21  5:35 PM  ? Specimen: Left Antecubital; Blood  ?Result Value Ref Range Status  ? Specimen Description   Final   ?  LEFT ANTECUBITAL BOTTLES DRAWN AEROBIC AND ANAEROBIC  ? Special Requests Blood Culture adequate volume  Final  ? Culture   Final  ?  NO GROWTH 3 DAYS ?Performed at [facility], [address], [place]

## 2021-07-04 LAB — CBC
HCT: 34.8 % — ABNORMAL LOW (ref 36.0–46.0)
Hemoglobin: 11.3 g/dL — ABNORMAL LOW (ref 12.0–15.0)
MCH: 28.3 pg (ref 26.0–34.0)
MCHC: 32.5 g/dL (ref 30.0–36.0)
MCV: 87 fL (ref 80.0–100.0)
Platelets: 264 10*3/uL (ref 150–400)
RBC: 4 MIL/uL (ref 3.87–5.11)
RDW: 18.4 % — ABNORMAL HIGH (ref 11.5–15.5)
WBC: 22 10*3/uL — ABNORMAL HIGH (ref 4.0–10.5)
nRBC: 0 % (ref 0.0–0.2)

## 2021-07-04 LAB — GLUCOSE, CAPILLARY
Glucose-Capillary: 94 mg/dL (ref 70–99)
Glucose-Capillary: 136 mg/dL — ABNORMAL HIGH (ref 70–99)
Glucose-Capillary: 104 mg/dL — ABNORMAL HIGH (ref 70–99)

## 2021-07-04 LAB — BASIC METABOLIC PANEL
Anion gap: 6 (ref 5–15)
BUN: 14 mg/dL (ref 8–23)
CO2: 19 mmol/L — ABNORMAL LOW (ref 22–32)
Calcium: 8.9 mg/dL (ref 8.9–10.3)
Chloride: 112 mmol/L — ABNORMAL HIGH (ref 98–111)
Creatinine, Ser: 0.83 mg/dL (ref 0.44–1.00)
GFR, Estimated: >60 mL/min (ref >60)
Glucose, Bld: 100 mg/dL — ABNORMAL HIGH (ref 70–99)
Potassium: 3.8 mmol/L (ref 3.5–5.1)
Sodium: 137 mmol/L (ref 135–145)

## 2021-07-04 MED ORDER — INSULIN ASPART 100 UNIT/ML FLEXPEN: 0.0000 [IU] | PEN_INJECTOR | Freq: Three times a day (TID) | SUBCUTANEOUS | 11 refills | Status: AC

## 2021-07-04 MED ORDER — ONDANSETRON HCL 4 MG PO TABS: 4.0000 mg | ORAL_TABLET | Freq: Four times a day (QID) | ORAL | 0 refills | Status: AC | PRN

## 2021-07-04 MED ORDER — METOPROLOL TARTRATE 25 MG PO TABS
25.0000 mg | ORAL_TABLET | Freq: Two times a day (BID) | ORAL | 3 refills | Status: AC
Start: 2021-07-04 — End: ?

## 2021-07-04 MED ORDER — LEVOTHYROXINE SODIUM 100 MCG PO TABS: 100.0000 ug | ORAL_TABLET | Freq: Every day | ORAL | 3 refills | Status: AC

## 2021-07-04 MED ORDER — MEGESTROL ACETATE 400 MG/10ML PO SUSP: 400.0000 mg | Freq: Two times a day (BID) | ORAL | 2 refills | Status: AC

## 2021-07-04 MED ORDER — HEPARIN SOD (PORK) LOCK FLUSH 100 UNIT/ML IV SOLN
500.0000 [IU] | Freq: Once | INTRAVENOUS | Status: AC
  Administered 2021-07-04: 500 [IU] via INTRAVENOUS
  Filled 2021-07-04: qty 5

## 2021-07-04 MED ORDER — APIXABAN 5 MG PO TABS: 5.0000 mg | ORAL_TABLET | Freq: Two times a day (BID) | ORAL | 5 refills | Status: AC

## 2021-07-04 MED ORDER — ENSURE ENLIVE PO LIQD: 237.0000 mL | Freq: Two times a day (BID) | ORAL | 12 refills | Status: AC

## 2021-07-04 MED ORDER — OXYCODONE HCL 5 MG PO TABS: 5.0000 mg | ORAL_TABLET | ORAL | 0 refills | Status: AC | PRN

## 2021-07-04 MED ORDER — CEPHALEXIN 500 MG PO CAPS: 500.0000 mg | ORAL_CAPSULE | Freq: Three times a day (TID) | ORAL | 0 refills | Status: AC

## 2021-07-04 NOTE — Discharge Summary (Addendum)
?                                                                                ? ? ?Hannah Knight, is a 66 y.o. female  DOB [birth year]  MRN [id number]. ? ?Admission date:  06/29/2021  Admitting Physician  Murlean Iba, MD ? ?Discharge Date:  07/04/2021  ? ?Primary MD  Celene Squibb, MD ? ?Recommendations for primary care physician for things to follow:  ? ? 1) please consider transitioning to hospice care if nutritional status and overall health continues to decline ?2) please give Magic cup/vitamins/nutritional supplements including Ensure/boost --- please continue to encourage adequate caloric intake ?3)insulin aspart (novoLOG) injection 0-10 Units  ?0-10 Units Subcutaneous, 3 times daily with meals  ?CBG < 70: Implement Hypoglycemia Standing Orders and refer to Hypoglycemia Standing Orders sidebar report   ?CBG 70 - 120: 0 unit ?CBG 121 - 150: 0 unit ? CBG 151 - 200: 1 unit  ?CBG 201 - 250: 2 units  ?CBG 251 - 300: 4 units  ?CBG 301 - 350: 6 units   ?CBG 351 - 400: 8 units ? CBG > 400: 10 units ? ?Admission Diagnosis  Dehydration [E86.0] ?Weakness [R53.1] ?Generalized weakness [R53.1] ?Nausea and vomiting, unspecified vomiting type [R11.2] ?Symptomatic anemia [D64.9] ? ? ?Discharge Diagnosis  Dehydration [E86.0] ?Weakness [R53.1] ?Generalized weakness [R53.1] ?Nausea and vomiting, unspecified vomiting type [R11.2] ?Symptomatic anemia [D64.9]   ? ?Principal Problem: ?  Generalized weakness ?Active Problems: ?  E. coli UTI ?  Diabetes mellitus without complication ([place]) ?  Essential hypertension, benign ?  Hypothyroidism ?  Dysphagia ?  Gastric cancer ([place]) ?  Port-A-Cath in place ?  Pulmonary embolus ([place]) ?  Pressure injury of skin ?  Symptomatic anemia ?  Malnutrition of moderate degree ?  Hypomagnesemia ?  Hypokalemia ?    ? ?Past Medical History:  ?Diagnosis Date  ? Allergy   ? Arthritis   ? Diabetes mellitus   ? Graves disease   ? with radiation, now on Synthroid  ? Graves disease   ? Hypertension    ? Port-A-Cath in place 03/08/2021  ? ? ?Past Surgical History:  ?Procedure Laterality Date  ? BIOPSY  01/23/2021  ? Procedure: BIOPSY;  Surgeon: [name], DO;  [facility];  Service: Endoscopy;;  ? CHOLECYSTECTOMY    ? COLONOSCOPY  09/22/2007  ? SLF: moderate internal hemorrhoids, multiple 3-4 mm sessile polyps in splenic flexure, hyperplastic.   ? COLONOSCOPY  09/27/2002  ? Rehman:Small external hemorrhoids/Six tiny   polyps ablated by cold biopsy from sigmoid colon  ? COLONOSCOPY N/A 06/29/2013  ? Moderate sized internal hemorrhoids. Sigmoid diverticulosis. Normal TI. Banding X 3.  ? COLONOSCOPY WITH PROPOFOL N/A 01/23/2021  ? Procedure: COLONOSCOPY WITH PROPOFOL;  Surgeon: [name], DO;  [facility];  Service: Endoscopy;  Laterality: N/A;  8:00am  ? ESOPHAGOGASTRODUODENOSCOPY (EGD) WITH PROPOFOL N/A 01/23/2021  ? Procedure: ESOPHAGOGASTRODUODENOSCOPY (EGD) WITH PROPOFOL;  Surgeon: [name], DO;  [facility];  Service: Endoscopy;  Laterality: N/A;  ? HEMORRHOID BANDING  06/29/2013  ? Procedure: HEMORRHOID BANDING;  Surgeon: Danie Binder, MD;  Location: [facility];  Service: Endoscopy;;  ? IR IMAGING GUIDED PORT INSERTION  02/21/2021  ? IR US GUIDE BX ASP/DRAIN  02/21/2021  ? TUBAL LIGATION    ? ? ? HPI  from the history and physical done on the day of admission:  ? ?HPI: Hannah Knight is a 66 y.o. female with medical history significant of with history of diabetes mellitus type 2, Graves' disease, hypertension, and more presents to the ED with a chief complaint of generalized weakness.  Patient reports that started 2 days ago.  Has been progressively worse since it started.  Now she cannot walk.  She reports that the weakness is not asymmetric.  At baseline she is able to walk without any assistive device.  She lives with her sister who is able to help her, but she is mostly independent.  She reports a cramping pain in her bilateral upper  extremities.  She reports that this has been going on since she has been on chemo, but it seems worse for the last couple of days.  Patient also reports dysphagia as its been going on since November.  She can only swallow liquids and soft foods.  She reports she been taking boost and Ensure at home.  Patient denies any fevers, cough, nausea/vomiting, abdominal pain, dysuria.  The generalized weakness is the specific complaint. ?  ?Patient is currently undergoing treatment for gastric adenocarcinoma.  Her last chemo was a week before last, her next treatment is May 11.  Patient follows with Dr. Delton Coombes who has been consulted. ?  ?Patient reports she does not smoke, does not drink, does not use illicit drugs.  She is vaccinated for COVID.  Patient is DNR. ?Review of Systems: As mentioned in the history of present illness. All other systems reviewed and are negative. ? ? ? Hospital Course:  ? ?  ?66 y.o. female with medical history significant of with history of diabetes mellitus type 2, Graves' disease, hypertension, and more presents to the ED with a chief complaint of generalized weakness.  Patient reports that started 2 days ago.  Has been progressively worse since it started.  Now she cannot walk.  She reports that the weakness is not asymmetric.  At baseline she is able to walk without any assistive device.  She lives with her sister who is able to help her, but she is mostly independent.  She reports a cramping pain in her bilateral upper extremities.  She reports that this has been going on since she has been on chemo, but it seems worse for the last couple of days.  Patient also reports dysphagia as its been going on since November.  She can only swallow liquids and soft foods.  She reports she been taking boost and Ensure at home.  Patient denies any fevers, cough, nausea/vomiting, abdominal pain, dysuria.  The generalized weakness is the specific complaint. ?  ?Patient is currently undergoing treatment  for gastric adenocarcinoma.  Her last chemo was a week before last, her next treatment is May 11.  Patient follows with Dr. Delton Coombes who has been consulted. ?  ?Patient reports she does not smoke, does not drink, does not use illicit drugs.  She is vaccinated for COVID.  Patient is DNR. ? ? ?Assessment and Plan: ?* Generalized weakness/deconditioning ?-2/2 chemo, deconditioning, poor PO intake and anemia  ?--PT eval and treat recommending SNF.   ?-transfused 2 unit PRBC on 07/01/21 ?-Transfer to [facility] for short-term rehab ? ? ?  E. coli UTI ?-- Patient was symptomatic, treated with IV Rocephin  ?--okay to discharge on p.o. Keflex ? ?Hypokalemia/hypomagnesemia ?-- Normalized after replacement ? ?Malnutrition of moderate degree/dysphagia ?-- pt having decreased appetite most likely due to gastric cancer and ongoing chemotherapy.  ?-- appreciate SLP for swallow eval,  ?esophagram on 07/03/2021 consistent with esophageal dysmotility ?-Mechanically soft diet advised ?-Restart Megace for appetite stimulation ?-Not a candidate for G-tube due to location of gastric malignancy in the anterior body of the stomach ?-Patient and sisters is not keen on having J-tube placement at this time ? ?Persistent leukocytosis----query reactive in the setting of metastatic malignancy ?-Pansensitive E. coli UTI appears to have been appropriately addressed with Rocephin ? ?Symptomatic anemia ?-- Hg was down to 7.1  ?-- no signs of bleeding found ?-- suspect from recent chemotherapy ?-Hemoglobin currently stable above 11 after transfusion of 2 units of PRBC ? ?Pressure injury of skin ?Pressure Injury 06/30/21 Sacrum Stage 2 -  Partial thickness loss of dermis presenting as a shallow open injury with a red, pink wound bed without slough. (Active)  ?06/30/21 2000  ?Location: Sacrum  ?Location Orientation:   ?Staging: Stage 2 -  Partial thickness loss of dermis presenting as a shallow open injury with a red, pink wound bed without  slough.  ?Wound Description (Comments):   ?Present on Admission: Yes  ? ?--continue skin care protocol  ? ?Pulmonary embolus ([facility]) ?-- continue apixaban for full anticoagulation ?-- no evidence of GI bleed

## 2021-07-04 NOTE — Discharge Instructions (Signed)
1) please consider transitioning to hospice care if nutritional status and overall health continues to decline ?2) please give Magic cup/vitamins/nutritional supplements including Ensure/boost --- please continue to encourage adequate caloric intake ?3)insulin aspart (novoLOG) injection 0-10 Units  ?0-10 Units Subcutaneous, 3 times daily with meals  ?CBG < 70: Implement Hypoglycemia Standing Orders and refer to Hypoglycemia Standing Orders sidebar report   ?CBG 70 - 120: 0 unit ?CBG 121 - 150: 0 unit ? CBG 151 - 200: 1 unit  ?CBG 201 - 250: 2 units  ?CBG 251 - 300: 4 units  ?CBG 301 - 350: 6 units   ?CBG 351 - 400: 8 units ? CBG > 400: 10 units ?

## 2021-07-04 NOTE — Progress Notes (Signed)
Patient discharging with EMS. Sister Kippy notified.  ?

## 2021-07-04 NOTE — TOC Transition Note (Signed)
Transition of Care (TOC) - CM/SW Discharge Note ? ? ?Patient Details  ?Name: Hannah Knight ?MRN: [id number] ?Date of Birth: 1955/11/29 ? ?Transition of Care (TOC) CM/SW Contact:  ?Boneta Lucks, RN ?Phone Number: ?07/04/2021, 2:24 PM ? ? ?Clinical Narrative:   [facility] received INS AUTH. RN will call report, TOC printed med necessity and will schedule EMS.  MD spoke with  sisters about palliative and hospice care. TOC updated Debbie that family  will need to continue discussion and make a plan due to patients decline in health.  ? ?Final next level of care: [place] ?Barriers to Discharge: Barriers Resolved ? ?Patient Goals and CMS Choice ?Patient states their goals for this hospitalization and ongoing recovery are:: short term SNF ?  ?Choice offered to / list presented to : Patient ? ?Discharge Placement ?  ?          ?Patient chooses bed at:  [facility]) ?Patient to be transferred to facility by: EMS ?Name of family member notified: Sisters at the bedside ?Patient and family notified of of transfer: 07/04/21 ? ?Discharge Plan and Services ?In-house Referral: Clinical Social Work ?  ?Post Acute Care Choice: [place]          ?    ?Readmission Risk Interventions ? ?  07/04/2021  ?  2:23 PM 07/02/2021  ? 11:03 AM  ?Readmission Risk Prevention Plan  ?Transportation Screening Complete Complete  ?PCP or Specialist Appt within 5-7 Days Complete   ?Home Care Screening Complete   ?Medication Review (RN CM) Complete   ?[place] or Home Care Consult  Complete  ?Social Work Consult for [place] Planning/Counseling  Complete  ?Palliative Care Screening  Not Applicable  ?Medication Review [employer])  Complete  ? ? ? ? ? ?

## 2021-07-05 ENCOUNTER — Encounter (HOSPITAL_COMMUNITY): Payer: 59

## 2021-07-05 LAB — CULTURE, BLOOD (ROUTINE X 2)
Culture: NO GROWTH
Culture: NO GROWTH
Special Requests: ADEQUATE
Special Requests: ADEQUATE

## 2021-07-05 LAB — VITAMIN B1: Vitamin B1 (Thiamine): 75.4 nmol/L (ref 66.5–200.0)

## 2021-07-12 ENCOUNTER — Ambulatory Visit (HOSPITAL_COMMUNITY): Payer: 59 | Admitting: Speech Pathology

## 2021-07-12 ENCOUNTER — Other Ambulatory Visit (HOSPITAL_COMMUNITY): Payer: 59

## 2021-07-16 ENCOUNTER — Telehealth (HOSPITAL_COMMUNITY): Payer: Self-pay | Admitting: *Deleted

## 2021-07-16 NOTE — Telephone Encounter (Signed)
Spoke to patient's sister Narda Rutherford and she has stated that they have called hospice in to care for her needs at this point.  All future appointments cancelled. ?

## 2021-07-17 ENCOUNTER — Ambulatory Visit (HOSPITAL_COMMUNITY): Payer: 59 | Admitting: Hematology

## 2021-07-17 ENCOUNTER — Ambulatory Visit (HOSPITAL_COMMUNITY): Payer: 59

## 2021-07-17 ENCOUNTER — Other Ambulatory Visit (HOSPITAL_COMMUNITY): Payer: 59

## 2021-07-19 ENCOUNTER — Encounter (HOSPITAL_COMMUNITY): Payer: 59

## 2021-08-02 DEATH — deceased

## 2024-01-26 IMAGING — RF DG ESOPHAGUS
6 series · 14 of 18 positions shown · non-contrast
Comparison: None

CLINICAL DATA: Dysphagia, gastric cancer

EXAM:
ESOPHOGRAM/BARIUM SWALLOW
TECHNIQUE: Single contrast examination was performed using thin barium. Patient
also swallowed a 12.5 mm diameter barium tablet.
FLUOROSCOPY:
Radiation Exposure Index (as provided by the fluoroscopic device): 1
minute 54 seconds mGy Kerma

[Series 2: cp_standard · 0.18mm/px · 3 of 314 frames shown (1 of 6)]
[frame 48/314]
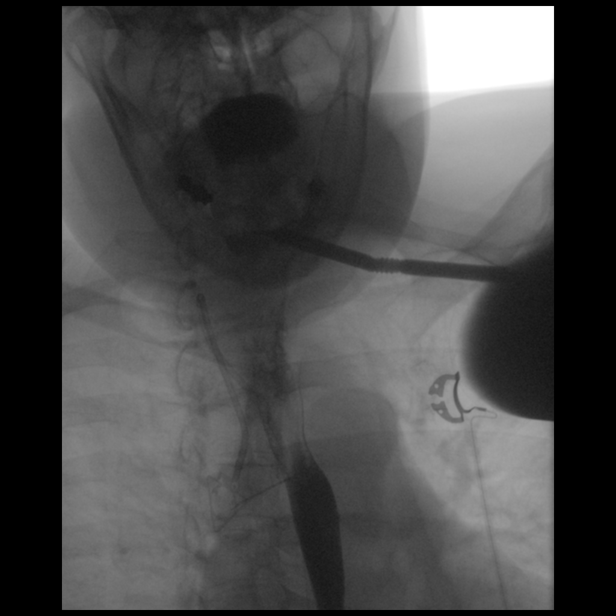
[frame 71/314]
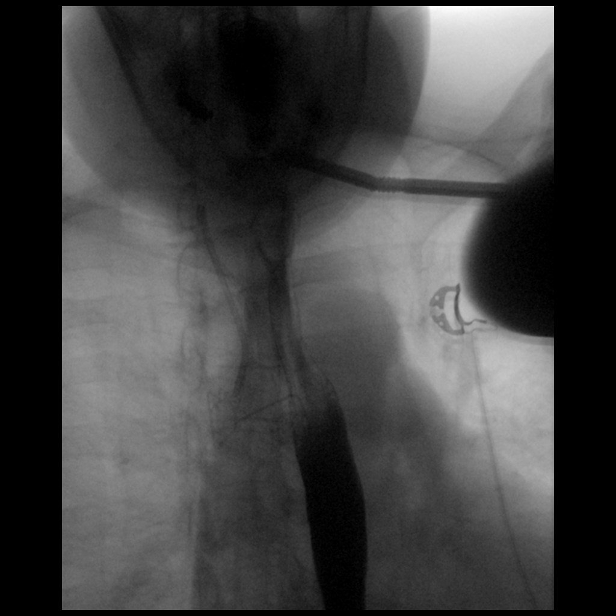
[frame 267/314]
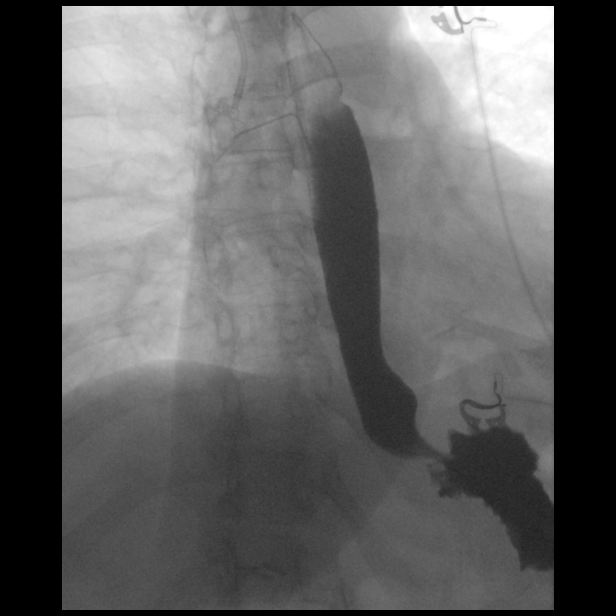

[Series 3: cp_standard · 0.18mm/px · 3 of 120 frames shown (2 of 6)]
[frame 19/120]
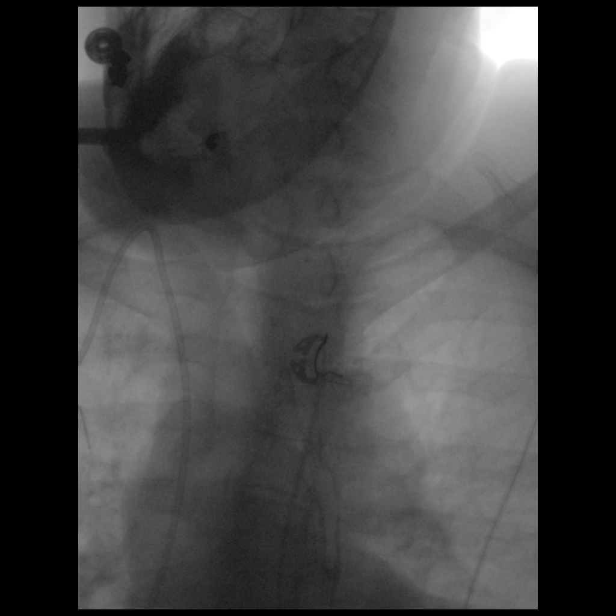
[frame 61/120]
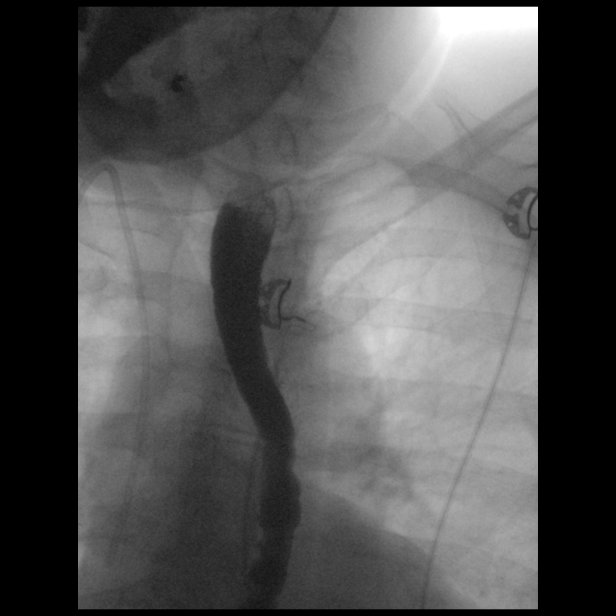
[frame 103/120]
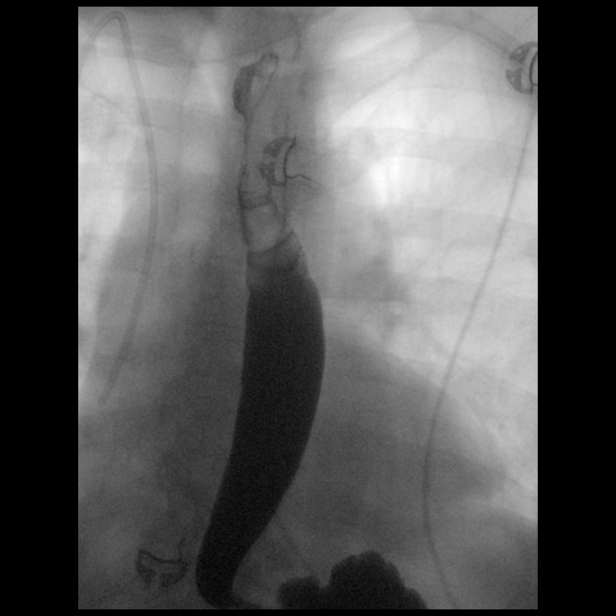

[Series 4: cp_standard · 0.17mm/px · 3 of 39 frames shown (3 of 6)]
[frame 6/39]
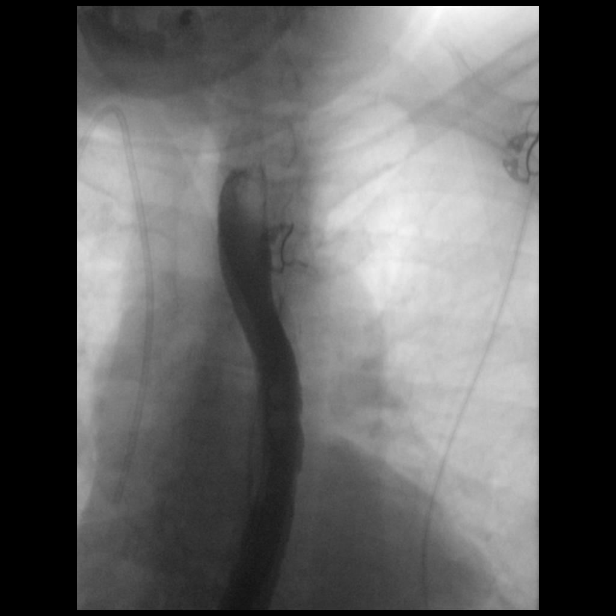
[frame 20/39]
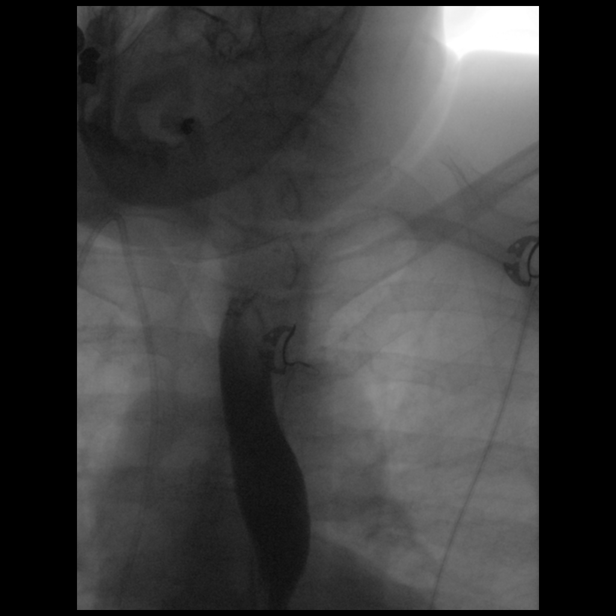
[frame 28/39]
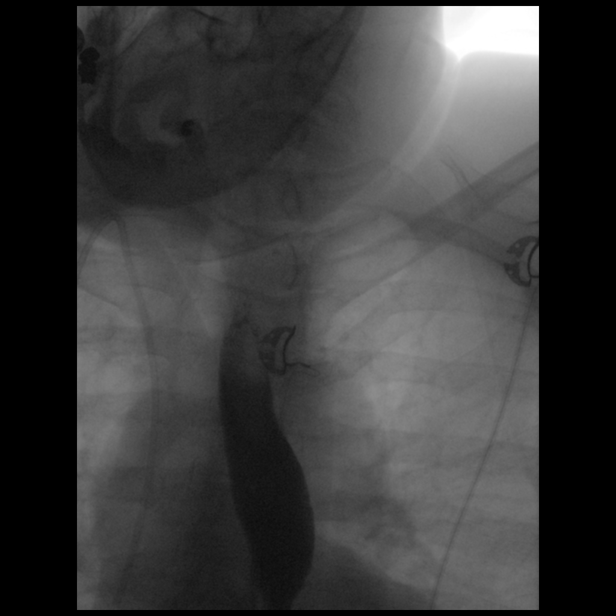

[Series 5: cp_standard · 0.18mm/px · 3 of 132 frames shown (4 of 6)]
[frame 20/132]
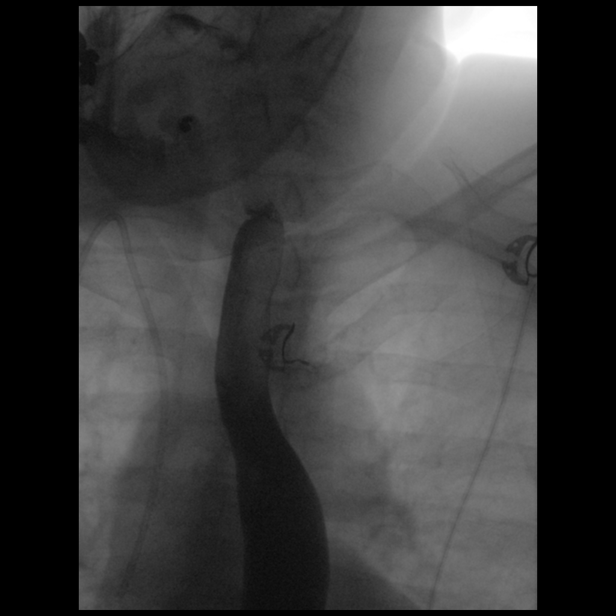
[frame 40/132]
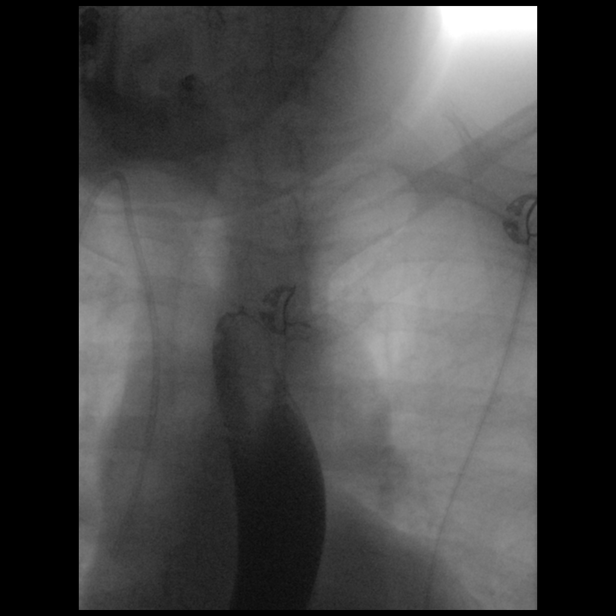
[frame 67/132]
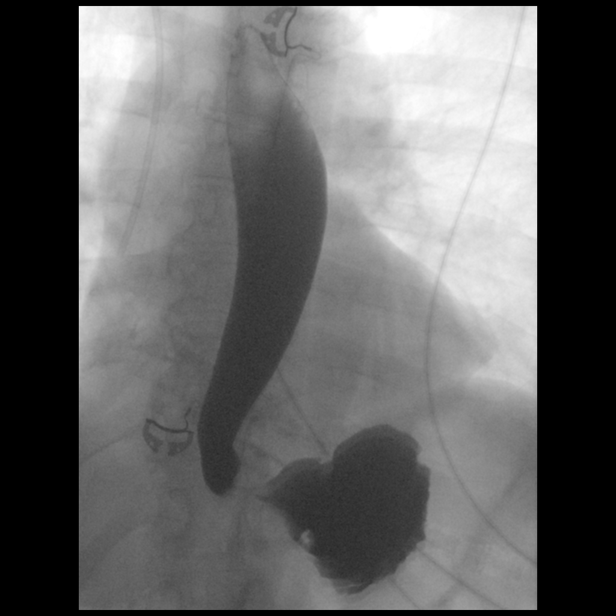

[Series 6: cp_standard · 0.26mm/px · 1 of 1 slices shown (5 of 6)]
[im 1/1]
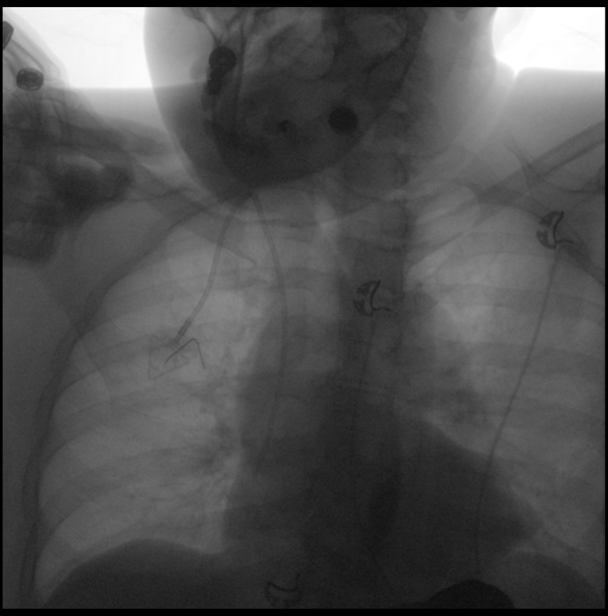

[Series 7: cp_standard · 0.27mm/px · 1 of 1 slices shown (6 of 6)]
[im 1/1]
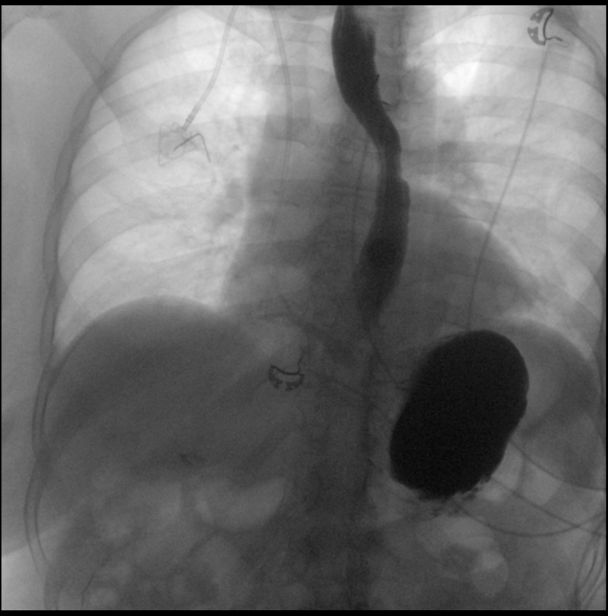

[14 of 18 positions shown; findings below may reference images not displayed]

FINDINGS: High normal esophageal distention without obvious mass or stricture.

Patient swallowed a 12.5 mm diameter barium tablet, which passed to
the distal esophagus.

However since patient was not upright, the pill did not pass to the
GE junction to assess GE junction patency.

Remainder of esophagus distended normally.

Smooth mucosa without definite filling defects.

Diffuse esophageal dysmotility with incomplete clearance of barium
by primary peristaltic waves, scattered secondary and tertiary
waves, and intermittent retrograde peristalsis.

Patient unable to position for upright AP and lateral views of the
swallowing mechanism.
IMPRESSION: Limited exam demonstrating esophageal dysmotility.

Remainder of exam unremarkable.
